# Patient Record
Sex: Female | Born: 1950
Health system: Southern US, Community
[De-identification: ages and names within clinical notes are randomized; demographics above are authoritative.]

## PROBLEM LIST (undated history)

## (undated) DIAGNOSIS — E7800 Pure hypercholesterolemia, unspecified: Secondary | ICD-10-CM

## (undated) DIAGNOSIS — N159 Renal tubulo-interstitial disease, unspecified: Secondary | ICD-10-CM

## (undated) DIAGNOSIS — N200 Calculus of kidney: Secondary | ICD-10-CM

## (undated) DIAGNOSIS — E785 Hyperlipidemia, unspecified: Secondary | ICD-10-CM

## (undated) DIAGNOSIS — K219 Gastro-esophageal reflux disease without esophagitis: Secondary | ICD-10-CM

## (undated) DIAGNOSIS — I10 Essential (primary) hypertension: Secondary | ICD-10-CM

## (undated) DIAGNOSIS — M1990 Unspecified osteoarthritis, unspecified site: Secondary | ICD-10-CM

## (undated) DIAGNOSIS — IMO0001 Reserved for inherently not codable concepts without codable children: Secondary | ICD-10-CM

## (undated) DIAGNOSIS — R011 Cardiac murmur, unspecified: Secondary | ICD-10-CM

## (undated) DIAGNOSIS — J449 Chronic obstructive pulmonary disease, unspecified: Secondary | ICD-10-CM

## (undated) HISTORY — PX: OTHER SURGICAL HISTORY: SHX169

---

## 2007-06-06 ENCOUNTER — Ambulatory Visit: Payer: Self-pay | Admitting: Gynecology

## 2007-06-26 ENCOUNTER — Ambulatory Visit (HOSPITAL_COMMUNITY): Admission: RE | Admit: 2007-06-26 | Discharge: 2007-06-26 | Payer: Self-pay | Admitting: Gynecology

## 2007-07-12 ENCOUNTER — Ambulatory Visit: Payer: Self-pay | Admitting: Gynecology

## 2007-08-13 ENCOUNTER — Inpatient Hospital Stay (HOSPITAL_COMMUNITY): Admission: RE | Admit: 2007-08-13 | Discharge: 2007-08-15 | Payer: Self-pay | Admitting: Gynecology

## 2007-08-13 ENCOUNTER — Encounter (INDEPENDENT_AMBULATORY_CARE_PROVIDER_SITE_OTHER): Payer: Self-pay | Admitting: Gynecology

## 2007-08-13 ENCOUNTER — Ambulatory Visit: Payer: Self-pay | Admitting: Gynecology

## 2007-08-29 ENCOUNTER — Ambulatory Visit: Payer: Self-pay | Admitting: *Deleted

## 2007-10-04 ENCOUNTER — Ambulatory Visit: Payer: Self-pay | Admitting: Gynecology

## 2011-02-08 NOTE — H&P (Signed)
[name], [name] NO.:  [id number]   MEDICAL RECORD NO.:  [id number]           PATIENT TYPE:   LOCATION:                                 FACILITY:   PHYSICIAN:  Ginger Carne, MD  DATE OF BIRTH:  1951/07/17   DATE OF ADMISSION:  DATE OF DISCHARGE:                              HISTORY & PHYSICAL   PREOPERATIVE HISTORY AND PHYSICAL.   CHIEF COMPLAINT:  A 9 cm right adnexal complex cystic mass.   HISTORY OF PRESENT ILLNESS:  This patient is a 60 year old  postmenopausal female who presents with an enlarging right adnexal mass.  Initial sonogram obtained at [facility] on  May 11, 2007, demonstrated a right ovary measuring 7.2 x 7.2 x 7.3  cm.  Repeat sonogram on June 26, 2007 at the [facility] of  [place] indicated said mass to be 9.1 cm with cystic and hyperechoic  solid components with the appearance of a benign cystic teratoma.  No  evidence for ascites was noted.  The left ovary was normal.  The uterus  demonstrated 4 leiomyoma with a total overall dimension of 8 cm x 4.2 x  6.1 cm.  The patient had encountered a 1-week history of postmenopausal  bleeding which brought her in to the [facility] clinics in early  September of 2008.  An endometrial biopsy demonstrated atrophic  endometrium, no evidence of neoplasia or hyperplasia.   OB/GYN HISTORY:  The patient has had five pregnancies, three resulting  in normal spontaneous vaginal deliveries, and two first trimester  miscarriages.   MEDICAL HISTORY:  1. Hypercholesterolemia.  2. Asthma.  3. Hypertension.  4. Type 2 diabetes.   CURRENT MEDICATIONS:  1. Ranitidine 150 mg daily.  2. Metformin 500 mg twice a day.  3. Aspirin 325 mg daily.  4. Diltiazem 240 mg daily.  5. Zocor 20 mg daily.  6. Albuterol inhaler taken as needed.  7. Advair inhaler taken as needed.   SURGICAL HISTORY:  Right breast biopsy in 2001 reported as benign.   SOCIAL HISTORY:   The patient smokes 1/2-pack of cigarettes daily.  Denies alcohol or illicit drug abuse.   FAMILY HISTORY:  Type 2 diabetes in mother and father.  Myocardial  infarction of her mother.  Hypertension of mother and father.  Deep  venous thrombophlebitis of her mother, kidney stones as well (mother).   REVIEW OF SYSTEMS:  A 14-point comprehensive review of systems within  normal limits.   PHYSICAL EXAM:  VITAL SIGNS:  Blood pressure 142/80, weight 200 pounds,  height 6 feet 3 inches.  Pulse 80.  HEENT: Grossly normal.  BREAST EXAM:  Without masses, discharge, thickenings, or tenderness.  CHEST:  Clear to percussion and auscultation.  CARDIOVASCULAR EXAM:  Without murmurs or enlargements.  Regular rate and  rhythm.  EXTREMITIES, LYMPHATICS, SKIN, NEUROLOGICAL, AND MUSCULOSKELETAL:  Systems within normal limits.  ABDOMEN:  Soft without gross hepatosplenomegaly.  PELVIC EXAM:  External genitalia, vulva and vagina normal.  Cervix  smooth without erosions or lesions.  Normal Pap smear.  Uterus is  approximately 8  weeks' in size, globular.  Right adnexa demonstrates  fullness.  Left adnexa normal.  RECTAL EXAM:  Confirmatory of pelvic exam.   IMPRESSION:  A 9 cm complex right adnexal mass, possible benign cystic  teratoma.   PLAN:  The patient would prefer a right salpingo-oophorectomy, and only  proceed with a total abdominal hysterectomy and bilateral salpingo-  oophorectomy if warranted based on either borderline malignancy or  suspicion for malignancy.  She understands the risks of performing  surgery in the setting of a frozen section which may necessitate, after  final pathology cuts, more surgery in the event that she only has a  right salpingo-oophorectomy done.  Also, the patient understands that  there are no guarantees that the left ovary may, over time, develop a  carcinoma or a new mass.  The nature of said procedure was discussed in  detail; risks, including possible  injuries to ureter, bowel, and  bladder, hemorrhage possibly requiring a blood transfusion, infection,  intraoperative and postoperative pulmonary, infectious complications  were discussed and understood by said patient.  Therefore, the plan is  to proceed with a right salpingo-oophorectomy with possible conversion  to a total abdominal hysterectomy, bilateral salpingo-oophorectomy, and  omentectomy if warranted.  Ashby Dawes of said procedure discussed in detail.      Ginger Carne, MD  Electronically Signed     SHB/MEDQ  D:  08/08/2007  T:  08/09/2007  Job:  [id number]

## 2011-02-08 NOTE — Op Note (Signed)
NAMEANAELLE, DUNTON NO.:  [id number]   MEDICAL RECORD NO.:  [id number]          PATIENT TYPE:  INP   LOCATION:  9316                          FACILITY:  [facility]   PHYSICIAN:  Ginger Carne, MD  DATE OF BIRTH:  June 20, 1951   DATE OF PROCEDURE:  08/13/2007  DATE OF DISCHARGE:                               OPERATIVE REPORT   PREOPERATIVE DIAGNOSIS:  Right ovarian mass.   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   PROCEDURE:  Open right salpingo-oophorectomy.   SURGEON:  Ginger Carne, MD   ASSISTANT:  Myra C. Marice Potter, MD.   ESTIMATED BLOOD LOSS:  Less than 100 mL.   COMPLICATIONS:  None immediate.   SPECIMEN:  Right tube and ovary to pathology.   OPERATIVE FINDINGS:  Upon opening the abdomen, the right ovary was  approximately 10 cm in size and adherent to the rectosigmoid colon by  filmy adhesions.  There were no excrescences on the surface and no  suggestion of carcinoma based on appearance.  Left tube and ovary  appeared normal.  The uterus was small without irregularity on its  surface.  Large and small bowel were grossly normal.  No periaortic or  pelvic enlargement of lymph nodes.   OPERATIVE PROCEDURE:  The patient prepped and draped in the usual  fashion and placed in the supine position.  Betadine solution used for  antiseptic and the patient was catheterized prior to the procedure.  After adequate general anesthesia, a vertical infraumbilical incision  was made and the abdomen opened.  Inspection of the abdominal and pelvic  contents followed.  Meticulous dissection of adhesions between the right  ovary and the rectosigmoid colon was performed.  The ureter was  carefully identified throughout the course of the right pelvis.  The  right infundibulopelvic ligament was identified, clamped, cut and  ligated twice with 0 Vicryl suture.  The right utero-ovarian ligament  was then clamped, cut and  ligated with 0 Vicryl suture.  Specimen sent  for frozen section, which revealed a benign cystic teratoma.  Bleeding  points hemostatically checked, blood clots removed.  Closure of  the fascia in one layer with double-looped 0 PDS running suture and skin  staples for the skin.  Instrument and sponge count were correct.  The  patient tolerated the procedure well and returned to the post anesthesia  recovery room in excellent condition.      Ginger Carne, MD  Electronically Signed     SHB/MEDQ  D:  08/13/2007  T:  08/14/2007  Job:  [id number]

## 2011-02-08 NOTE — Discharge Summary (Signed)
[name], [name] NO.:  [id number]   MEDICAL RECORD NO.:  [id number]          PATIENT TYPE:  INP   LOCATION:  9316                          [facility]ILITY[facility]   PHYSICIAN:  Ginger Carne, MD  DATE OF BIRTH:  1951/02/20   DATE OF ADMISSION:  08/13/2007  DATE OF DISCHARGE:  08/15/2007                               DISCHARGE SUMMARY   REASON FOR HOSPITALIZATION:  Right adnexal mass.   IN-HOSPITAL PROCEDURE:  Right salpingo-oophorectomy (by laparotomy).   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   HOSPITAL COURSE:  This patient is a 60 year old [place] American female  who underwent the aforementioned procedure on August 13, 2007.  Intraoperative course unremarkable.  Postoperatively, she did well.  She  was afebrile.  H&H 34.8 and 11.2.  Creatinine 0.9.  Her abdomen was  soft.  Incision was dry.  Calves without tenderness.  Lungs were clear.  The patient had a bowel movement prior to discharge.  She was discharged  with routine postoperative instructions including contacting the office  for temperature elevation above 100.4 degrees Fahrenheit, increasing  abdominal or pelvic pain, incisional bleeding, erythema or drainage.  Also, she is to contact the office for constipation or urinary  symptomatology.   She will continue her medications which she had taken prior to her  surgery including Zocor, metformin, ranitidine, aspirin, diltiazem,  albuterol and Advair at doses indicated in the MR sheet.  She was  prescribed Percocet 5/325 one to two every 4-6 hours as needed for pain  postoperatively.   She will be seen on August 27, 2007, for staple removal and she will  contact the GYN clinic for same.  All questions answered to the  satisfaction of said patient.  The patient verbalized understanding of  same.      Ginger Carne, MD  Electronically Signed     SHB/MEDQ  D:  08/15/2007  T:  08/15/2007   Job:  [id number]

## 2011-02-08 NOTE — Group Therapy Note (Signed)
NAME:  [name], FRYMAN NO.:  [id number]   MEDICAL RECORD NO.:  [id number]          PATIENT TYPE:  WOC   LOCATION:  [facility]                   FACILITY:  [facility]   PHYSICIAN:  Ginger Carne, MD DATE OF BIRTH:  06/26/1951   DATE OF SERVICE:  06/06/2007                                  CLINIC NOTE   This patient is a 60 year old African-American female with a 1-week  history of postmenopausal bleeding.  Her last menstrual period was about  5 years ago.  She also was noted on ultrasound to have a right ovarian  enlargement measuring 7.2 cm with a cyst described at 6.3 cm  consistency, nature of cyst not described and report left ovary was  normal in size.  The uterus was also normal in size.   PHYSICAL EXAMINATION:  GENITOURINARY:  External genitalia, vulva and  vaginal normal.  Cervix smooth without erosions or lesions.  Uterus  appeared normal size.  Endometrial biopsy performed.  Right adnexa is  palpable but difficult to ascertain fullness or mass.  Left adnexa  appeared normal.   IMPRESSION:  Post menopausal bleeding and right adnexal mass.   PLAN:  The patient is scheduled for a follow-up pelvic sonogram end of  September 2008 and also the patient was asked to return in 2 weeks for  follow-up regarding her endometrial biopsy.           ______________________________  Ginger Carne, MD     SHB/MEDQ  D:  06/06/2007  T:  06/07/2007  Job:  [id number]

## 2011-06-01 ENCOUNTER — Encounter: Payer: Self-pay | Admitting: *Deleted

## 2011-06-01 ENCOUNTER — Emergency Department (HOSPITAL_BASED_OUTPATIENT_CLINIC_OR_DEPARTMENT_OTHER)
Admission: EM | Admit: 2011-06-01 | Discharge: 2011-06-01 | Disposition: A | Discharge status: Home / Self Care | Payer: Self-pay | Attending: Emergency Medicine | Admitting: Emergency Medicine

## 2011-06-01 ENCOUNTER — Other Ambulatory Visit: Payer: Self-pay

## 2011-06-01 DIAGNOSIS — E7800 Pure hypercholesterolemia, unspecified: Secondary | ICD-10-CM | POA: Insufficient documentation

## 2011-06-01 DIAGNOSIS — F17200 Nicotine dependence, unspecified, uncomplicated: Secondary | ICD-10-CM | POA: Insufficient documentation

## 2011-06-01 DIAGNOSIS — J4489 Other specified chronic obstructive pulmonary disease: Secondary | ICD-10-CM | POA: Insufficient documentation

## 2011-06-01 DIAGNOSIS — E119 Type 2 diabetes mellitus without complications: Secondary | ICD-10-CM | POA: Insufficient documentation

## 2011-06-01 DIAGNOSIS — J449 Chronic obstructive pulmonary disease, unspecified: Secondary | ICD-10-CM | POA: Insufficient documentation

## 2011-06-01 DIAGNOSIS — I10 Essential (primary) hypertension: Secondary | ICD-10-CM | POA: Diagnosis present

## 2011-06-01 HISTORY — DX: Chronic obstructive pulmonary disease, unspecified: J44.9

## 2011-06-01 HISTORY — DX: Essential (primary) hypertension: I10

## 2011-06-01 HISTORY — DX: Pure hypercholesterolemia, unspecified: E78.00

## 2011-06-01 LAB — BASIC METABOLIC PANEL
BUN: 16 mg/dL (ref 6–23)
Chloride: 103 mEq/L (ref 96–112)
Creatinine, Ser: 0.7 mg/dL (ref 0.50–1.10)
GFR calc Af Amer: >60 mL/min (ref >60)
Glucose, Bld: 102 mg/dL — ABNORMAL HIGH (ref 70–99)

## 2011-06-01 LAB — URINALYSIS, ROUTINE W REFLEX MICROSCOPIC
Bilirubin Urine: NEGATIVE
Nitrite: NEGATIVE
Urobilinogen, UA: 1 mg/dL (ref 0.0–1.0)
pH: 7.5 (ref 5.0–8.0)

## 2011-06-01 LAB — URINE MICROSCOPIC-ADD ON

## 2011-06-01 MED ORDER — HYDROCHLOROTHIAZIDE 25 MG PO TABS: 25.0000 mg | ORAL_TABLET | Freq: Every day | ORAL | Status: DC

## 2011-06-01 NOTE — ED Provider Notes (Signed)
History     CSN: [id number] Arrival date & time: 06/01/2011  4:33 AM  Chief Complaint  Patient presents with  . Hypertension   HPI Pt with history of HTN has been doing well recently. She slipped and fell at work earlier today and pulled a muscle in her back. While getting checked there, her BP was noted to be elevated. She did not want to seek medical attention at the time and finished her shift before going home. She woke up during the night feeling her pulse beating in her ears and was concerned about her BP still being elevated. Her pain has mostly resolved. She denies any CP, SOB, nausea vomiting. She has been urinating more frequently recently which she attributes to high sugars. She has not had any changes in her medications recently.  Past Medical History  Diagnosis Date  . Hypertension   . Diabetes mellitus   . High cholesterol   . Asthma   . COPD (chronic obstructive pulmonary disease)     Past Surgical History  Procedure Date  . Fillopian tube removal   . Right breast cyst removal     No family history on file.  History  Substance Use Topics  . Smoking status: Current Everyday Smoker  . Smokeless tobacco: Not on file  . Alcohol Use: No    OB History    Grav Para Term Preterm Abortions TAB SAB Ect Mult Living                  Review of Systems All other systems reviewed and are negative except as noted in HPI.   Physical Exam  BP 188/82  Pulse 57  Temp(Src) 97.9 F (36.6 C) (Oral)  Resp 18  Ht 5\' 4"  (1.626 m)  Wt 180 lb (81.647 kg)  BMI 30.90 kg/m2  SpO2 97%  Physical Exam  Nursing note and vitals reviewed. Constitutional: She is oriented to person, place, and time. She appears well-developed and well-nourished.  HENT:  Head: Normocephalic and atraumatic.  Eyes: EOM are normal. Pupils are equal, round, and reactive to light.  Neck: Normal range of motion. Neck supple.  Cardiovascular: Normal rate, normal heart sounds and intact distal pulses.     Pulmonary/Chest: Effort normal and breath sounds normal.  Abdominal: Bowel sounds are normal. She exhibits no distension. There is no tenderness.  Musculoskeletal: Normal range of motion. She exhibits no edema and no tenderness.  Neurological: She is alert and oriented to person, place, and time. No cranial nerve deficit.  Skin: Skin is warm and dry. No rash noted.  Psychiatric: She has a normal mood and affect.    ED Course  Procedures  MDM  Date: 06/01/2011  Rate: 56  Rhythm: sinus bradycardia  QRS Axis: normal  Intervals: normal  ST/T Wave abnormalities: normal  Conduction Disutrbances:none  Narrative Interpretation:   Old EKG Reviewed: unchanged from 08/10/07   6:28 AM No evidence of end organ damage. BP still slightly elevated. Will borderline HR will not increase diltiazem. Plan to start HCTZ and advised to followup with PCP in a week.

## 2011-06-01 NOTE — ED Notes (Signed)
Dr Bernette Mayers at bedside to assess pt

## 2011-06-01 NOTE — ED Notes (Signed)
Pt states she slipped on a water spill at work this evening, falling onto her knee and pulling her back. States the staff filled out an incident report and checked her vitals per protocol. States they noticed her BP was elevated at that time. 170/100 per pt. States she went to bed tonight, and woke up feeling like her pulse was throbbing. Pt here tonight to be evaluated for continued elevated BP

## 2011-06-01 NOTE — ED Notes (Signed)
Pt states she does not have a BP monitor at home, and is unsure whether her BP has been running high before tonight. Denies HA, dizziness or lightheadedness, but states she has had some change in vision for the past several weeks. Last visit with PMD was several months ago "and i think everything looked fine". Pt states she has been taking her meds as prescribed and does not miss doses.

## 2011-06-22 ENCOUNTER — Emergency Department (HOSPITAL_BASED_OUTPATIENT_CLINIC_OR_DEPARTMENT_OTHER)
Admission: EM | Admit: 2011-06-22 | Discharge: 2011-06-22 | Disposition: A | Discharge status: Home / Self Care | Payer: Self-pay | Attending: Emergency Medicine | Admitting: Emergency Medicine

## 2011-06-22 ENCOUNTER — Other Ambulatory Visit: Payer: Self-pay

## 2011-06-22 ENCOUNTER — Encounter (HOSPITAL_BASED_OUTPATIENT_CLINIC_OR_DEPARTMENT_OTHER): Payer: Self-pay | Admitting: *Deleted

## 2011-06-22 DIAGNOSIS — E119 Type 2 diabetes mellitus without complications: Secondary | ICD-10-CM | POA: Insufficient documentation

## 2011-06-22 DIAGNOSIS — J4489 Other specified chronic obstructive pulmonary disease: Secondary | ICD-10-CM | POA: Insufficient documentation

## 2011-06-22 DIAGNOSIS — I10 Essential (primary) hypertension: Secondary | ICD-10-CM

## 2011-06-22 DIAGNOSIS — R0789 Other chest pain: Secondary | ICD-10-CM

## 2011-06-22 DIAGNOSIS — J449 Chronic obstructive pulmonary disease, unspecified: Secondary | ICD-10-CM | POA: Insufficient documentation

## 2011-06-22 DIAGNOSIS — E785 Hyperlipidemia, unspecified: Secondary | ICD-10-CM | POA: Insufficient documentation

## 2011-06-22 HISTORY — DX: Calculus of kidney: N20.0

## 2011-06-22 HISTORY — DX: Reserved for inherently not codable concepts without codable children: IMO0001

## 2011-06-22 HISTORY — DX: Hyperlipidemia, unspecified: E78.5

## 2011-06-22 HISTORY — DX: Renal tubulo-interstitial disease, unspecified: N15.9

## 2011-06-22 HISTORY — DX: Gastro-esophageal reflux disease without esophagitis: K21.9

## 2011-06-22 LAB — TROPONIN I: Troponin I: <0.3 ng/mL (ref <0.30)

## 2011-06-22 NOTE — ED Provider Notes (Signed)
History     CSN: [id number] Arrival date & time: 06/22/2011  5:34 PM  Chief Complaint  Patient presents with  . Hypertension    180/100    (Consider location/radiation/quality/duration/timing/severity/associated sxs/prior treatment) HPI Comments:  Patient reports that for the past month she has had rather random episodes of diaphoresis not necessarily associated with any chest pain, shortness of breath or physical activity. 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 She reports that when she palpated that area she experienced a sharp pain that was nonradiating. She has had repeated episodes of the sharp pain even without palpation but certainly with palpation it seems to aggravate that region. She admits that she does do a lot of lifting of patients moving him around and behaving at work. She reports that in the last few weeks with physical activity such as exercise or prolonged walking, she does not experience any chest tightness, shortness of breath, nausea or vomiting. Occasionally she will have some diaphoresis that she feels somewhat excessive. She denies any weight loss, change in cough and no pleuritic chest pain. She denies any upper respiratory infection symptoms. She does have a history of type 2 diabetes as well as high blood pressure. She reports that while she was at work and she experienced these symptoms, she had a nurse took her blood pressure and was found to be approximately 180/100 and therefore between her symptoms and her elevated blood pressure decided to come here to the emergency department for further evaluation. At this moment in time she denies any chest pain, sweats, nausea, shortness of breath.  Patient is a 60 y.o. female presenting with hypertension. The history is provided by the patient.  Hypertension    Past Medical History  Diagnosis Date  . Hypertension   . Diabetes  mellitus   . High cholesterol   . Asthma   . COPD (chronic obstructive pulmonary disease)   . Hyperlipidemia   . Reflux   . Kidney infection   . Kidney stone     Past Surgical History  Procedure Date  . Fillopian tube removal   . Right breast cyst removal     No family history on file.  History  Substance Use Topics  . Smoking status: Current Some Day Smoker  . Smokeless tobacco: Not on file  . Alcohol Use: No    OB History    Grav Para Term Preterm Abortions TAB SAB Ect Mult Living                  Review of Systems  All other systems reviewed and are negative.    Allergies  Azithromycin and Keflex  Home Medications   Current Outpatient Rx  Name Route Sig Dispense Refill  . ALBUTEROL SULFATE HFA 108 (90 BASE) MCG/ACT IN AERS Inhalation Inhale 1 puff into the lungs every 6 (six) hours as needed. Shortness of breath and wheezing      . ASPIRIN EC 81 MG PO TBEC Oral Take 81 mg by mouth daily.      Marland Kitchen DILTIAZEM HCL COATED BEADS 240 MG PO CP24 Oral Take 240 mg by mouth daily.      Marland Kitchen FLUTICASONE-SALMETEROL 500-50 MCG/DOSE IN AEPB Inhalation Inhale 1 puff into the lungs every 12 (twelve) hours.      Marland Kitchen HYDROCHLOROTHIAZIDE 25 MG PO TABS Oral Take 1 tablet (25 mg total) by mouth daily. 30 tablet 0  .  METFORMIN HCL 500 MG PO TABS Oral Take 500 mg by mouth 2 (two) times daily with a meal.      . PRAVASTATIN SODIUM 20 MG PO TABS Oral Take 20 mg by mouth daily.        BP 169/83  Pulse 57  Temp(Src) 98.3 F (36.8 C) (Oral)  Resp 20  Ht 5\' 3"  (1.6 m)  Wt 190 lb (86.183 kg)  BMI 33.66 kg/m2  SpO2 99%  Physical Exam  Constitutional: She is oriented to person, place, and time. She appears well-developed and well-nourished. No distress.  HENT:  Head: Normocephalic and atraumatic.  Eyes: Pupils are equal, round, and reactive to light.  Neck: Neck supple.  Cardiovascular: Normal rate, regular rhythm and normal heart sounds.   Pulmonary/Chest: Effort normal and breath  sounds normal.    Abdominal: Soft. Bowel sounds are normal.  Musculoskeletal: She exhibits no edema and no tenderness.  Neurological: She is alert and oriented to person, place, and time.  Skin: Skin is warm and dry. No rash noted. She is not diaphoretic.  Psychiatric: She has a normal mood and affect.    ED Course  Procedures (including critical care time)   Labs Reviewed  TROPONIN I   No results found.   No diagnosis found.    MDM  EKG done at time 17:46. Rate of 57, sinus bradycardia. There is a RSR prime QRS complex in leads V1 and V2 which are probably normal variant. She has normal axis and normal ST and T-waves.  Patient happens to have elevated blood pressure here. I believe her chest pain is very atypical and certainly reproducible here. She does not look toxic whatsoever. She is counseled about keeping a log regarding her elevated blood pressures and to return to her primary care physician for further treatment of high blood pressure. Her EKG shows no ischemic changes in her clinical picture is very atypical for acute coronary syndrome. She has no other symptoms nor exam findings suggestive of pneumothorax, PE, thoracic injury or thoracic tear.  If troponin I is negative, I feel with very low clinical suspicion for ACS, pt can return home and follow up with PCP.        6:31 PM Pt's troponin is <0.30.  Pt continues to be CP free.  I think NSAID use is appropriate for her CP.  Can f/u with PCP regarding elevated BP  Gavin Pound. Bryon Parker, MD 06/22/11 1610

## 2011-06-22 NOTE — Discharge Instructions (Signed)
 You have been diagnosed by your caregiver as having chest wall pain. SEEK IMMEDIATE MEDICAL ATTENTION IF: You develop a fever.  Your chest pains become severe or intolerable.  You develop new, unexplained symptoms (problems).  You develop shortness of breath, nausea, vomiting, sweating or feel light headed.  You develop a new cough or you cough up blood.  It is reasonable to take ibuprofen  or Aleve  taken with food at home for mild to moderate chest pain symptoms.

## 2011-06-22 NOTE — ED Notes (Signed)
Patient states she was seen here approximately 2 weeks ago and given a prescription for cardizem.  Was seen by her PCP and had potassium added to her medications yesterday.  Has a follow up appointment on Friday.  States today at approximately 330pm, she became diaphoretic and had pain in the center of her chest when she touches it.  Blood pressure was checked by Nurse and found to be 180/100.

## 2011-06-26 ENCOUNTER — Encounter (HOSPITAL_BASED_OUTPATIENT_CLINIC_OR_DEPARTMENT_OTHER): Payer: Self-pay | Admitting: *Deleted

## 2011-06-26 ENCOUNTER — Emergency Department (HOSPITAL_BASED_OUTPATIENT_CLINIC_OR_DEPARTMENT_OTHER)
Admission: EM | Admit: 2011-06-26 | Discharge: 2011-06-26 | Disposition: A | Discharge status: Home / Self Care | Payer: Self-pay | Attending: Emergency Medicine | Admitting: Emergency Medicine

## 2011-06-26 DIAGNOSIS — J4489 Other specified chronic obstructive pulmonary disease: Secondary | ICD-10-CM | POA: Insufficient documentation

## 2011-06-26 DIAGNOSIS — J449 Chronic obstructive pulmonary disease, unspecified: Secondary | ICD-10-CM | POA: Insufficient documentation

## 2011-06-26 DIAGNOSIS — Z8739 Personal history of other diseases of the musculoskeletal system and connective tissue: Secondary | ICD-10-CM | POA: Insufficient documentation

## 2011-06-26 DIAGNOSIS — I10 Essential (primary) hypertension: Secondary | ICD-10-CM | POA: Insufficient documentation

## 2011-06-26 DIAGNOSIS — E7800 Pure hypercholesterolemia, unspecified: Secondary | ICD-10-CM | POA: Insufficient documentation

## 2011-06-26 DIAGNOSIS — E119 Type 2 diabetes mellitus without complications: Secondary | ICD-10-CM | POA: Insufficient documentation

## 2011-06-26 HISTORY — DX: Unspecified osteoarthritis, unspecified site: M19.90

## 2011-06-26 HISTORY — DX: Cardiac murmur, unspecified: R01.1

## 2011-06-26 LAB — CBC
RBC: 4.64 MIL/uL (ref 3.87–5.11)
RDW: 14.4 % (ref 11.5–15.5)
WBC: 10.3 10*3/uL (ref 4.0–10.5)

## 2011-06-26 LAB — DIFFERENTIAL
Basophils Absolute: 0 10*3/uL (ref 0.0–0.1)
Lymphocytes Relative: 26 % (ref 12–46)
Lymphs Abs: 2.6 10*3/uL (ref 0.7–4.0)
Neutro Abs: 6.9 10*3/uL (ref 1.7–7.7)

## 2011-06-26 LAB — BASIC METABOLIC PANEL
Calcium: 10.7 mg/dL — ABNORMAL HIGH (ref 8.4–10.5)
Creatinine, Ser: 0.8 mg/dL (ref 0.50–1.10)
Glucose, Bld: 106 mg/dL — ABNORMAL HIGH (ref 70–99)
Sodium: 141 mEq/L (ref 135–145)

## 2011-06-26 MED ORDER — AMLODIPINE BESYLATE 10 MG PO TABS: 10.0000 mg | ORAL_TABLET | Freq: Every day | ORAL | Status: DC

## 2011-06-26 NOTE — ED Notes (Signed)
Care and follow up reviewed family supportive at side

## 2011-06-26 NOTE — ED Provider Notes (Signed)
Medical screening examination/treatment/procedure(s) were performed by non-physician practitioner and as supervising physician I was immediately available for consultation/collaboration.   Lena Fieldhouse A Tylee Newby, MD 06/26/11 1849 

## 2011-06-26 NOTE — ED Notes (Signed)
States having high blood pressure x 1 month- has been started on new meds- also feeling dizzy and weak- "don't feel myself"

## 2011-06-26 NOTE — ED Provider Notes (Signed)
History     CSN: [id number] Arrival date & time: 06/26/2011  4:05 PM  Chief Complaint  Patient presents with  . Hypertension    (Consider location/radiation/quality/duration/timing/severity/associated sxs/prior treatment) Patient is a 60 y.o. female presenting with hypertension.  Hypertension This is a new problem. The current episode started today. The problem occurs constantly. The problem has been gradually worsening. The symptoms are aggravated by nothing. She has tried nothing for the symptoms. The treatment provided no relief.  Pt reports her blood pressure was high today at work.  Pt is on clonidine and hctz.  Pt reports the medications make her feel bad. [facility]  Past Medical History  Diagnosis Date  . Hypertension   . Diabetes mellitus   . High cholesterol   . Asthma   . COPD (chronic obstructive pulmonary disease)   . Hyperlipidemia   . Reflux   . Kidney infection   . Kidney stone   . Arthritis   . Heart murmur     Past Surgical History  Procedure Date  . Fillopian tube removal   . Right breast cyst removal     No family history on file.  History  Substance Use Topics  . Smoking status: Current Everyday Smoker -- 0.5 packs/day  . Smokeless tobacco: Not on file  . Alcohol Use: No    OB History    Grav Para Term Preterm Abortions TAB SAB Ect Mult Living                  Review of Systems  All other systems reviewed and are negative.    Allergies  Azithromycin and Keflex  Home Medications   Current Outpatient Rx  Name Route Sig Dispense Refill  . ALBUTEROL SULFATE HFA 108 (90 BASE) MCG/ACT IN AERS Inhalation Inhale 1 puff into the lungs every 6 (six) hours as needed. Shortness of breath and wheezing      . ASPIRIN EC 81 MG PO TBEC Oral Take 81 mg by mouth daily.      Marland Kitchen CLONIDINE HCL 0.2 MG PO TABS Oral Take 0.2 mg by mouth 2 (two) times daily.      Marland Kitchen FLUTICASONE-SALMETEROL 500-50 MCG/DOSE IN AEPB Inhalation  Inhale 1 puff into the lungs every 12 (twelve) hours.      Marland Kitchen HYDROCHLOROTHIAZIDE 25 MG PO TABS Oral Take 1 tablet (25 mg total) by mouth daily. 30 tablet 0  . METFORMIN HCL 500 MG PO TABS Oral Take 500 mg by mouth 2 (two) times daily with a meal.      . PRAVASTATIN SODIUM 40 MG PO TABS Oral Take 40 mg by mouth daily.      Marland Kitchen DILTIAZEM HCL COATED BEADS 240 MG PO CP24 Oral Take 240 mg by mouth daily.      Marland Kitchen PRAVASTATIN SODIUM 20 MG PO TABS Oral Take 20 mg by mouth daily.        BP 120/59  Pulse 56  Temp(Src) 98.1 F (36.7 C) (Oral)  Resp 18  Ht 5\' 3"  (1.6 m)  Wt 185 lb (83.915 kg)  BMI 32.77 kg/m2  SpO2 98%  Physical Exam  Nursing note and vitals reviewed. Constitutional: She is oriented to person, place, and time. She appears well-developed and well-nourished.  HENT:  Head: Normocephalic.  Eyes: Conjunctivae and EOM are normal. Pupils are equal, round, and reactive to light.  Neck: Normal range of motion. Neck supple.  Cardiovascular: Normal rate and regular rhythm.   Pulmonary/Chest:  Effort normal.  Abdominal: Soft.  Musculoskeletal: Normal range of motion.  Neurological: She is alert and oriented to person, place, and time.  Skin: Skin is warm.  Psychiatric: She has a normal mood and affect.    ED Course  Procedures (including critical care time)  Labs Reviewed  BASIC METABOLIC PANEL - Abnormal; Notable for the following:    Glucose, Bld 106 (*)    Calcium 10.7 (*)    All other components within normal limits  CBC  DIFFERENTIAL   No results found.   No diagnosis found.    MDM  I advised stop clonidine.  Continue hctz.  I will add norvasc        Langston Masker, [place] 06/26/11 1814

## 2011-07-05 LAB — BASIC METABOLIC PANEL
CO2: 29
Calcium: 10.3
GFR calc Af Amer: >60
GFR calc non Af Amer: >60
Sodium: 140

## 2011-07-05 LAB — CBC
HCT: 34.8 — ABNORMAL LOW
HCT: 37.5
MCHC: 32.2
MCHC: 33.3
MCV: 86.1
MCV: 84
RDW: 14.7
WBC: 17 — ABNORMAL HIGH

## 2011-07-05 LAB — COMPREHENSIVE METABOLIC PANEL
Alkaline Phosphatase: 88
BUN: 8
CO2: 32
Calcium: 9.9
Chloride: 105
Creatinine, Ser: 0.92
GFR calc non Af Amer: >60
Total Bilirubin: 0.7

## 2011-07-05 LAB — HCG, SERUM, QUALITATIVE: Preg, Serum: NEGATIVE

## 2012-02-03 ENCOUNTER — Emergency Department (INDEPENDENT_AMBULATORY_CARE_PROVIDER_SITE_OTHER): Payer: Self-pay

## 2012-02-03 ENCOUNTER — Emergency Department (HOSPITAL_BASED_OUTPATIENT_CLINIC_OR_DEPARTMENT_OTHER)
Admission: EM | Admit: 2012-02-03 | Discharge: 2012-02-03 | Disposition: A | Discharge status: Home / Self Care | Payer: Self-pay | Attending: Emergency Medicine | Admitting: Emergency Medicine

## 2012-02-03 ENCOUNTER — Encounter (HOSPITAL_BASED_OUTPATIENT_CLINIC_OR_DEPARTMENT_OTHER): Payer: Self-pay | Admitting: *Deleted

## 2012-02-03 ENCOUNTER — Telehealth (HOSPITAL_BASED_OUTPATIENT_CLINIC_OR_DEPARTMENT_OTHER): Payer: Self-pay | Admitting: *Deleted

## 2012-02-03 DIAGNOSIS — R059 Cough, unspecified: Secondary | ICD-10-CM | POA: Insufficient documentation

## 2012-02-03 DIAGNOSIS — E785 Hyperlipidemia, unspecified: Secondary | ICD-10-CM | POA: Insufficient documentation

## 2012-02-03 DIAGNOSIS — R05 Cough: Secondary | ICD-10-CM | POA: Insufficient documentation

## 2012-02-03 DIAGNOSIS — E119 Type 2 diabetes mellitus without complications: Secondary | ICD-10-CM | POA: Insufficient documentation

## 2012-02-03 DIAGNOSIS — J4489 Other specified chronic obstructive pulmonary disease: Secondary | ICD-10-CM | POA: Insufficient documentation

## 2012-02-03 DIAGNOSIS — J449 Chronic obstructive pulmonary disease, unspecified: Secondary | ICD-10-CM

## 2012-02-03 DIAGNOSIS — J45909 Unspecified asthma, uncomplicated: Secondary | ICD-10-CM

## 2012-02-03 DIAGNOSIS — F17200 Nicotine dependence, unspecified, uncomplicated: Secondary | ICD-10-CM | POA: Insufficient documentation

## 2012-02-03 DIAGNOSIS — R0609 Other forms of dyspnea: Secondary | ICD-10-CM

## 2012-02-03 DIAGNOSIS — I10 Essential (primary) hypertension: Secondary | ICD-10-CM | POA: Insufficient documentation

## 2012-02-03 MED ORDER — IPRATROPIUM BROMIDE 0.02 % IN SOLN
0.5000 mg | Freq: Once | RESPIRATORY_TRACT | Status: AC
  Administered 2012-02-03: 0.5 mg via RESPIRATORY_TRACT

## 2012-02-03 MED ORDER — ALBUTEROL SULFATE (5 MG/ML) 0.5% IN NEBU
5.0000 mg | INHALATION_SOLUTION | Freq: Once | RESPIRATORY_TRACT | Status: AC
  Administered 2012-02-03: 5 mg via RESPIRATORY_TRACT

## 2012-02-03 MED ORDER — IPRATROPIUM BROMIDE 0.02 % IN SOLN
RESPIRATORY_TRACT | Status: AC
  Administered 2012-02-03: 0.5 mg via RESPIRATORY_TRACT
  Filled 2012-02-03: qty 2.5

## 2012-02-03 MED ORDER — PREDNISONE 50 MG PO TABS
50.0000 mg | ORAL_TABLET | Freq: Every day | ORAL | Status: DC
  Administered 2012-02-03: 50 mg via ORAL

## 2012-02-03 MED ORDER — LEVOFLOXACIN 750 MG PO TABS: 750.0000 mg | ORAL_TABLET | Freq: Every day | ORAL | Status: AC

## 2012-02-03 MED ORDER — PREDNISONE 50 MG PO TABS
ORAL_TABLET | ORAL | Status: AC
  Filled 2012-02-03: qty 1

## 2012-02-03 MED ORDER — ALBUTEROL SULFATE (5 MG/ML) 0.5% IN NEBU
5.0000 mg | INHALATION_SOLUTION | Freq: Once | RESPIRATORY_TRACT | Status: AC
  Administered 2012-02-03: 5 mg via RESPIRATORY_TRACT
  Filled 2012-02-03: qty 1

## 2012-02-03 MED ORDER — LEVOFLOXACIN 750 MG PO TABS: 750.0000 mg | ORAL_TABLET | Freq: Every day | ORAL | Status: DC

## 2012-02-03 MED ORDER — ALBUTEROL SULFATE (5 MG/ML) 0.5% IN NEBU
INHALATION_SOLUTION | RESPIRATORY_TRACT | Status: AC
  Administered 2012-02-03: 5 mg via RESPIRATORY_TRACT
  Filled 2012-02-03: qty 1

## 2012-02-03 MED ORDER — IPRATROPIUM-ALBUTEROL 18-103 MCG/ACT IN AERO: 2.0000 | INHALATION_SPRAY | Freq: Four times a day (QID) | RESPIRATORY_TRACT | Status: DC | PRN

## 2012-02-03 MED ORDER — PREDNISONE 50 MG PO TABS: 50.0000 mg | ORAL_TABLET | Freq: Every day | ORAL | Status: DC

## 2012-02-03 MED ORDER — IPRATROPIUM BROMIDE 0.02 % IN SOLN
0.5000 mg | Freq: Once | RESPIRATORY_TRACT | Status: AC
  Administered 2012-02-03: 0.5 mg via RESPIRATORY_TRACT
  Filled 2012-02-03: qty 2.5

## 2012-02-03 NOTE — ED Provider Notes (Signed)
Pt called back because levaquin was too expensive.  Pt with anaphylaxis to cephalosporin abx, so PCN's probably not safe.  Doxycycline is called in although still expensive, pt will decide if she wants to fill it or now.  She is otherwise treated for COPD with breathing treatments and steroids and had been told to follow up with PCP.    [name]. [name], MD 02/03/12 1333

## 2012-02-03 NOTE — ED Notes (Signed)
Pt return from radiology, RT at bs, second neb tx given.

## 2012-02-03 NOTE — ED Notes (Signed)
Pt takes tx at home for her asthma and is a smoker. Pt takes MDI Albuterol and has taken some HHN tx at home but presented to the ED with some SHOB and tightness in her chest. Pt does have some upper airway wheezes but some insp_exp wheezes on ascultation.

## 2012-02-03 NOTE — ED Notes (Signed)
[name] called to state she can not afford the Levaquin prescription.  Chart reviewed by Dr. Oletta Lamas.  Orders received for Doxycycline 100mg  pm bid.  Prescription was called to [facility] [address] [place], [place] @ [phone].

## 2012-02-03 NOTE — ED Notes (Signed)
Pt c/o cough and SOB for few days, no relief from inhalers.

## 2012-02-03 NOTE — Discharge Instructions (Signed)

## 2012-02-03 NOTE — ED Notes (Signed)
Patient transported to X-ray 

## 2012-02-03 NOTE — ED Provider Notes (Addendum)
History     CSN: [id number]  Arrival date & time 02/03/12  4098   First MD Initiated Contact with Patient 02/03/12 [phone]      Chief Complaint  Patient presents with  . Cough  . Asthma    (Consider location/radiation/quality/duration/timing/severity/associated sxs/prior treatment) Patient is a 61 y.o. female presenting with cough and asthma. The history is provided by the patient. No language interpreter was used.  Cough This is a recurrent problem. The current episode started more than 2 days ago. The problem occurs every few minutes. The cough is productive of sputum. There has been no fever. Associated symptoms include ear congestion and wheezing. Pertinent negatives include no chest pain and no sore throat. The treatment provided no relief. She is a smoker. Her past medical history is significant for COPD and asthma.  Asthma This is a recurrent problem. The current episode started more than 2 days ago. The problem occurs constantly. The problem has not changed since onset.Pertinent negatives include no chest pain. The symptoms are aggravated by nothing. The symptoms are relieved by nothing. Treatments tried: albuterol. The treatment provided no relief.  No CP, No DOE. No n/v/d.  No f/c/r.  No leg swelling nor pain.    Past Medical History  Diagnosis Date  . Hypertension   . Diabetes mellitus   . High cholesterol   . Asthma   . COPD (chronic obstructive pulmonary disease)   . Hyperlipidemia   . Reflux   . Kidney infection   . Kidney stone   . Arthritis   . Heart murmur     Past Surgical History  Procedure Date  . Fillopian tube removal   . Right breast cyst removal     History reviewed. No pertinent family history.  History  Substance Use Topics  . Smoking status: Current Everyday Smoker -- 0.5 packs/day  . Smokeless tobacco: Not on file  . Alcohol Use: No    OB History    Grav Para Term Preterm Abortions TAB SAB Ect Mult Living                  Review of  Systems  Constitutional: Negative for fever.  HENT: Negative for sore throat.   Respiratory: Positive for cough and wheezing. Negative for stridor.   Cardiovascular: Negative for chest pain.  All other systems reviewed and are negative.    Allergies  Azithromycin and Cephalexin  Home Medications   Current Outpatient Rx  Name Route Sig Dispense Refill  . ALBUTEROL SULFATE HFA 108 (90 BASE) MCG/ACT IN AERS Inhalation Inhale 1 puff into the lungs every 6 (six) hours as needed. Shortness of breath and wheezing      . AMLODIPINE BESYLATE 10 MG PO TABS Oral Take 1 tablet (10 mg total) by mouth daily. 30 tablet 0  . ASPIRIN EC 81 MG PO TBEC Oral Take 81 mg by mouth daily.      Marland Kitchen DILTIAZEM HCL ER COATED BEADS 240 MG PO CP24 Oral Take 240 mg by mouth daily.      Marland Kitchen FLUTICASONE-SALMETEROL 500-50 MCG/DOSE IN AEPB Inhalation Inhale 1 puff into the lungs every 12 (twelve) hours.      Marland Kitchen HYDROCHLOROTHIAZIDE 25 MG PO TABS Oral Take 1 tablet (25 mg total) by mouth daily. 30 tablet 0  . METFORMIN HCL 500 MG PO TABS Oral Take 500 mg by mouth 2 (two) times daily with a meal.      . PRAVASTATIN SODIUM 20 MG PO TABS Oral Take  20 mg by mouth daily.      Marland Kitchen PRAVASTATIN SODIUM 40 MG PO TABS Oral Take 40 mg by mouth daily.        BP 135/74  Pulse 85  Temp(Src) 98.2 F (36.8 C) (Oral)  Resp 18  Ht 5\' 5"  (1.651 m)  Wt 190 lb (86.183 kg)  BMI 31.62 kg/m2  SpO2 100%  Physical Exam  Constitutional: She is oriented to person, place, and time. She appears well-developed and well-nourished. No distress.  HENT:  Head: Normocephalic and atraumatic.  Mouth/Throat: Oropharynx is clear and moist.  Eyes: Conjunctivae are normal. Pupils are equal, round, and reactive to light.  Neck: Normal range of motion. Neck supple.  Cardiovascular: Normal rate and regular rhythm.   Pulmonary/Chest: No stridor. She has wheezes.  Abdominal: Soft. Bowel sounds are normal.  Musculoskeletal: Normal range of motion.       No  snuff box tenderness of either wrist has arthritic changes of the hand  Neurological: She is alert and oriented to person, place, and time.  Skin: Skin is warm and dry.  Psychiatric: She has a normal mood and affect.    ED Course  Procedures (including critical care time)  Labs Reviewed - No data to display No results found.   No diagnosis found.    MDM  Follow up with your family doctor.  Return for chest pain, shortness of breath worsening wheezing worsening cough fevers or any concerns.         Jasmine Awe, MD 02/03/12 0455  Devetta Hagenow K Devynne Sturdivant-Rasch, MD 02/03/12 [phone]

## 2012-02-24 ENCOUNTER — Institutional Professional Consult (permissible substitution): Payer: Self-pay | Admitting: Pulmonary Disease

## 2012-08-14 ENCOUNTER — Institutional Professional Consult (permissible substitution): Payer: Self-pay | Admitting: Pulmonary Disease

## 2012-08-27 ENCOUNTER — Encounter (HOSPITAL_BASED_OUTPATIENT_CLINIC_OR_DEPARTMENT_OTHER): Payer: Self-pay | Admitting: *Deleted

## 2012-08-27 ENCOUNTER — Emergency Department (HOSPITAL_BASED_OUTPATIENT_CLINIC_OR_DEPARTMENT_OTHER)
Admission: EM | Admit: 2012-08-27 | Discharge: 2012-08-27 | Disposition: A | Discharge status: Home / Self Care | Payer: Medicaid Other | Attending: Emergency Medicine | Admitting: Emergency Medicine

## 2012-08-27 DIAGNOSIS — J4489 Other specified chronic obstructive pulmonary disease: Secondary | ICD-10-CM | POA: Insufficient documentation

## 2012-08-27 DIAGNOSIS — I10 Essential (primary) hypertension: Secondary | ICD-10-CM | POA: Insufficient documentation

## 2012-08-27 DIAGNOSIS — K219 Gastro-esophageal reflux disease without esophagitis: Secondary | ICD-10-CM | POA: Insufficient documentation

## 2012-08-27 DIAGNOSIS — Z7982 Long term (current) use of aspirin: Secondary | ICD-10-CM | POA: Insufficient documentation

## 2012-08-27 DIAGNOSIS — E785 Hyperlipidemia, unspecified: Secondary | ICD-10-CM | POA: Insufficient documentation

## 2012-08-27 DIAGNOSIS — J449 Chronic obstructive pulmonary disease, unspecified: Secondary | ICD-10-CM | POA: Insufficient documentation

## 2012-08-27 DIAGNOSIS — N189 Chronic kidney disease, unspecified: Secondary | ICD-10-CM | POA: Insufficient documentation

## 2012-08-27 DIAGNOSIS — R011 Cardiac murmur, unspecified: Secondary | ICD-10-CM | POA: Insufficient documentation

## 2012-08-27 DIAGNOSIS — E119 Type 2 diabetes mellitus without complications: Secondary | ICD-10-CM | POA: Insufficient documentation

## 2012-08-27 DIAGNOSIS — F17200 Nicotine dependence, unspecified, uncomplicated: Secondary | ICD-10-CM | POA: Insufficient documentation

## 2012-08-27 DIAGNOSIS — M129 Arthropathy, unspecified: Secondary | ICD-10-CM | POA: Insufficient documentation

## 2012-08-27 DIAGNOSIS — J45901 Unspecified asthma with (acute) exacerbation: Secondary | ICD-10-CM | POA: Insufficient documentation

## 2012-08-27 MED ORDER — PREDNISONE 20 MG PO TABS: ORAL_TABLET | ORAL | Status: DC

## 2012-08-27 MED ORDER — ALBUTEROL SULFATE HFA 108 (90 BASE) MCG/ACT IN AERS: 2.0000 | INHALATION_SPRAY | RESPIRATORY_TRACT | Status: DC | PRN

## 2012-08-27 NOTE — ED Provider Notes (Signed)
History     CSN: [id number]  Arrival date & time 08/27/12  0903   First MD Initiated Contact with Patient 08/27/12 0913      Chief Complaint  Patient presents with  . Asthma    (Consider location/radiation/quality/duration/timing/severity/associated sxs/prior treatment) HPI This 61 year old female is a smoker but has decreased her daily cigarette intake greatly she states he does not have a chronic smoker's cough or chronic cough, she does have a few days however of a nonproductive cough with no fever, she does have some mild wheezing and mild shortness of breath and is running out of her albuterol inhaler, she uses her albuterol inhaler 3 times a day the last few days, she is no confusion chest pain abdominal pain vomiting body aches or other concerns. Past Medical History  Diagnosis Date  . Hypertension   . Diabetes mellitus   . High cholesterol   . Asthma   . COPD (chronic obstructive pulmonary disease)   . Hyperlipidemia   . Reflux   . Kidney infection   . Kidney stone   . Arthritis   . Heart murmur     Past Surgical History  Procedure Date  . Fillopian tube removal   . Right breast cyst removal     History reviewed. No pertinent family history.  History  Substance Use Topics  . Smoking status: Current Every Day Smoker -- 0.5 packs/day  . Smokeless tobacco: Not on file  . Alcohol Use: No    OB History    Grav Para Term Preterm Abortions TAB SAB Ect Mult Living                  Review of Systems 10 Systems reviewed and are negative for acute change except as noted in the HPI. Allergies  Azithromycin and Cephalexin  Home Medications   Current Outpatient Rx  Name  Route  Sig  Dispense  Refill  . ALBUTEROL SULFATE HFA 108 (90 BASE) MCG/ACT IN AERS   Inhalation   Inhale 1 puff into the lungs every 6 (six) hours as needed. Shortness of breath and wheezing           . ALBUTEROL SULFATE HFA 108 (90 BASE) MCG/ACT IN AERS   Inhalation   Inhale 2 puffs  into the lungs every 2 (two) hours as needed for wheezing or shortness of breath (cough).   1 Inhaler   0   . IPRATROPIUM-ALBUTEROL 18-103 MCG/ACT IN AERO   Inhalation   Inhale 2 puffs into the lungs every 6 (six) hours as needed for wheezing.   1 Inhaler   0   . AMLODIPINE BESYLATE 10 MG PO TABS   Oral   Take 1 tablet (10 mg total) by mouth daily.   30 tablet   0   . ASPIRIN EC 81 MG PO TBEC   Oral   Take 81 mg by mouth daily.           Marland Kitchen DILTIAZEM HCL ER COATED BEADS 240 MG PO CP24   Oral   Take 240 mg by mouth daily.           Marland Kitchen FLUTICASONE-SALMETEROL 500-50 MCG/DOSE IN AEPB   Inhalation   Inhale 1 puff into the lungs every 12 (twelve) hours.           Marland Kitchen HYDROCHLOROTHIAZIDE 25 MG PO TABS   Oral   Take 1 tablet (25 mg total) by mouth daily.   30 tablet   0   .  METFORMIN HCL 500 MG PO TABS   Oral   Take 500 mg by mouth 2 (two) times daily with a meal.           . PRAVASTATIN SODIUM 20 MG PO TABS   Oral   Take 20 mg by mouth daily.           Marland Kitchen PRAVASTATIN SODIUM 40 MG PO TABS   Oral   Take 40 mg by mouth daily.           Marland Kitchen PREDNISONE 20 MG PO TABS      3 tabs po day one, then 2 po daily x 4 days   11 tablet   0   . PREDNISONE 50 MG PO TABS   Oral   Take 1 tablet (50 mg total) by mouth daily.   5 tablet   0     BP 121/74  Pulse 80  Temp 98.4 F (36.9 C) (Oral)  Resp 21  SpO2 100%  Physical Exam  Nursing note and vitals reviewed. Constitutional:       Awake, alert, nontoxic appearance.  HENT:  Head: Atraumatic.  Eyes: Right eye exhibits no discharge. Left eye exhibits no discharge.  Neck: Neck supple.  Cardiovascular: Normal rate and regular rhythm.   No murmur heard. Pulmonary/Chest: Effort normal. No respiratory distress. She has wheezes. She has no rales. She exhibits no tenderness.       The patient speaks full sentences but does have a mild scattered end expiratory wheezes without retractions or accessory muscle usage, her  pulse oximetry is normal on room air at 100% and the patient does not feel she needs a breathing treatment here in the emergency department.  Abdominal: Soft. There is no tenderness. There is no rebound.  Musculoskeletal: She exhibits no edema and no tenderness.       Baseline ROM, no obvious new focal weakness.  Neurological: She is alert.       Mental status and motor strength appears baseline for patient and situation.  Skin: No rash noted.  Psychiatric: She has a normal mood and affect.    ED Course  Procedures (including critical care time)  Labs Reviewed - No data to display No results found.   1. Asthma attack       MDM  Patient / Family / Caregiver informed of clinical course, understand medical decision-making process, and agree with plan.  I doubt any other EMC precluding discharge at this time including, but not necessarily limited to the following:SBI.           Hurman Horn, MD 08/31/12 [phone]

## 2012-08-27 NOTE — ED Notes (Signed)
Pt has asthma feeling more short of breath last 4 days

## 2012-08-27 NOTE — Discharge Instructions (Signed)
Asthma, Acute Bronchospasm Your exam shows you have asthma, or acute bronchospasm that acts like asthma. Bronchospasm means your air passages become narrowed. These conditions are due to inflammation and airway spasm that cause narrowing of the bronchial tubes in the lungs. This causes you to have wheezing and shortness of breath. CAUSES  Respiratory infections and allergies most often bring on these attacks. Smoking, air pollution, cold air, emotional upsets, and vigorous exercise can also bring them on.  TREATMENT   Treatment is aimed at making the narrowed airways larger. Mild asthma/bronchospasm is usually controlled with inhaled medicines. Albuterol is a common medicine that you breathe in to open spastic or narrowed airways. Some trade names for albuterol are Ventolin or Proventil. Steroid medicine is also used to reduce the inflammation when an attack is moderate or severe. Antibiotics (medications used to kill germs) are only used if a bacterial infection is present.  If you are pregnant and need to use Albuterol (Ventolin or Proventil), you can expect the baby to move more than usual shortly after the medicine is used. HOME CARE INSTRUCTIONS   Rest.  Drink plenty of liquids. This helps the mucus to remain thin and easily coughed up. Do not use caffeine or alcohol.  Do not smoke. Avoid being exposed to second-hand smoke.  You play a critical role in keeping yourself in good health. Avoid exposure to things that cause you to wheeze. Avoid exposure to things that cause you to have breathing problems. Keep your medications up-to-date and available. Carefully follow your doctor's treatment plan.  When pollen or pollution is bad, keep windows closed and use an air conditioner go to places with air conditioning. If you are allergic to furry pets or birds, find new homes for them or keep them outside.  Take your medicine exactly as prescribed.  Asthma requires careful medical attention. See  your caregiver for follow-up as advised. If you are more than [redacted] weeks pregnant and you were prescribed any new medications, let your Obstetrician know about the visit and how you are doing. Arrange a recheck. SEEK IMMEDIATE MEDICAL CARE IF:   You are getting worse.  You have trouble breathing. If severe, call 911.  You develop chest pain or discomfort.  You are throwing up or not drinking fluids.  You are not getting better within 24 hours.  You are coughing up yellow, green, brown, or bloody sputum.  You develop a fever over 102 F (38.9 C).  You have trouble swallowing. MAKE SURE YOU:   Understand these instructions.  Will watch your condition.  Will get help right away if you are not doing well or get worse. Document Released: 12/28/2006 Document Revised: 12/05/2011 Document Reviewed: 08/27/2007 [facility] Patient Information 2013 [place], [place].  You appear to have an upper respiratory infection (URI). An upper respiratory tract infection, or cold, is a viral infection of the air passages leading to the lungs. It is contagious and can be spread to others, especially during the first 3 or 4 days. It cannot be cured by antibiotics or other medicines. RETURN IMMEDIATELY IF you develop shortness of breath, confusion or altered mental status, a new rash, become dizzy, faint, or poorly responsive, or are unable to be cared for at home.

## 2012-08-28 ENCOUNTER — Institutional Professional Consult (permissible substitution): Payer: Self-pay | Admitting: Pulmonary Disease

## 2012-09-01 ENCOUNTER — Encounter (HOSPITAL_BASED_OUTPATIENT_CLINIC_OR_DEPARTMENT_OTHER): Payer: Self-pay | Admitting: *Deleted

## 2012-09-01 ENCOUNTER — Emergency Department (HOSPITAL_BASED_OUTPATIENT_CLINIC_OR_DEPARTMENT_OTHER): Payer: Medicaid Other

## 2012-09-01 ENCOUNTER — Observation Stay (HOSPITAL_BASED_OUTPATIENT_CLINIC_OR_DEPARTMENT_OTHER)
Admission: EM | Admit: 2012-09-01 | Discharge: 2012-09-03 | Disposition: A | Discharge status: Home / Self Care | Payer: Medicaid Other | Attending: Internal Medicine | Admitting: Internal Medicine

## 2012-09-01 DIAGNOSIS — E785 Hyperlipidemia, unspecified: Secondary | ICD-10-CM

## 2012-09-01 DIAGNOSIS — E119 Type 2 diabetes mellitus without complications: Secondary | ICD-10-CM

## 2012-09-01 DIAGNOSIS — I10 Essential (primary) hypertension: Secondary | ICD-10-CM | POA: Diagnosis present

## 2012-09-01 DIAGNOSIS — Z79899 Other long term (current) drug therapy: Secondary | ICD-10-CM | POA: Insufficient documentation

## 2012-09-01 DIAGNOSIS — F17200 Nicotine dependence, unspecified, uncomplicated: Secondary | ICD-10-CM | POA: Insufficient documentation

## 2012-09-01 DIAGNOSIS — Z7982 Long term (current) use of aspirin: Secondary | ICD-10-CM | POA: Insufficient documentation

## 2012-09-01 DIAGNOSIS — Z7902 Long term (current) use of antithrombotics/antiplatelets: Secondary | ICD-10-CM | POA: Insufficient documentation

## 2012-09-01 DIAGNOSIS — M1990 Unspecified osteoarthritis, unspecified site: Secondary | ICD-10-CM

## 2012-09-01 DIAGNOSIS — M069 Rheumatoid arthritis, unspecified: Secondary | ICD-10-CM

## 2012-09-01 DIAGNOSIS — J449 Chronic obstructive pulmonary disease, unspecified: Secondary | ICD-10-CM

## 2012-09-01 DIAGNOSIS — E7800 Pure hypercholesterolemia, unspecified: Secondary | ICD-10-CM | POA: Insufficient documentation

## 2012-09-01 DIAGNOSIS — J441 Chronic obstructive pulmonary disease with (acute) exacerbation: Principal | ICD-10-CM

## 2012-09-01 LAB — COMPREHENSIVE METABOLIC PANEL
AST: 12 U/L (ref 0–37)
BUN: 19 mg/dL (ref 6–23)
CO2: 30 mEq/L (ref 19–32)
Calcium: 9.8 mg/dL (ref 8.4–10.5)
Chloride: 103 mEq/L (ref 96–112)
Creatinine, Ser: 0.9 mg/dL (ref 0.50–1.10)
GFR calc Af Amer: 80 mL/min — ABNORMAL LOW (ref >90)
GFR calc non Af Amer: 69 mL/min — ABNORMAL LOW (ref >90)
Glucose, Bld: 206 mg/dL — ABNORMAL HIGH (ref 70–99)
Total Bilirubin: 0.3 mg/dL (ref 0.3–1.2)

## 2012-09-01 LAB — CBC WITH DIFFERENTIAL/PLATELET
Eosinophils Relative: 2 % (ref 0–5)
HCT: 35 % — ABNORMAL LOW (ref 36.0–46.0)
Hemoglobin: 11.7 g/dL — ABNORMAL LOW (ref 12.0–15.0)
Lymphocytes Relative: 33 % (ref 12–46)
Lymphs Abs: 5 10*3/uL — ABNORMAL HIGH (ref 0.7–4.0)
MCV: 82 fL (ref 78.0–100.0)
Monocytes Absolute: 0.8 10*3/uL (ref 0.1–1.0)
Monocytes Relative: 5 % (ref 3–12)
Neutro Abs: 9.2 10*3/uL — ABNORMAL HIGH (ref 1.7–7.7)
WBC: 15.3 10*3/uL — ABNORMAL HIGH (ref 4.0–10.5)

## 2012-09-01 MED ORDER — SODIUM CHLORIDE 0.9 % IV SOLN
Freq: Once | INTRAVENOUS | Status: AC
  Administered 2012-09-01: 10 mL/h via INTRAVENOUS

## 2012-09-01 MED ORDER — ALBUTEROL (5 MG/ML) CONTINUOUS INHALATION SOLN
10.0000 mg/h | INHALATION_SOLUTION | Freq: Once | RESPIRATORY_TRACT | Status: DC
  Filled 2012-09-01: qty 20

## 2012-09-01 MED ORDER — METHYLPREDNISOLONE SODIUM SUCC 125 MG IJ SOLR
125.0000 mg | Freq: Once | INTRAMUSCULAR | Status: AC
  Administered 2012-09-01: 125 mg via INTRAVENOUS
  Filled 2012-09-01: qty 2

## 2012-09-01 MED ORDER — ALBUTEROL SULFATE (5 MG/ML) 0.5% IN NEBU
INHALATION_SOLUTION | RESPIRATORY_TRACT | Status: AC
  Administered 2012-09-01: 5 mg via RESPIRATORY_TRACT
  Filled 2012-09-01: qty 1

## 2012-09-01 MED ORDER — ALBUTEROL SULFATE (5 MG/ML) 0.5% IN NEBU
INHALATION_SOLUTION | RESPIRATORY_TRACT | Status: AC
  Filled 2012-09-01: qty 0.5

## 2012-09-01 MED ORDER — IPRATROPIUM BROMIDE 0.02 % IN SOLN
RESPIRATORY_TRACT | Status: AC
  Administered 2012-09-01: 0.5 mg via RESPIRATORY_TRACT
  Filled 2012-09-01: qty 2.5

## 2012-09-01 MED ORDER — ALBUTEROL SULFATE (5 MG/ML) 0.5% IN NEBU
5.0000 mg | INHALATION_SOLUTION | Freq: Once | RESPIRATORY_TRACT | Status: AC
  Administered 2012-09-01: 5 mg via RESPIRATORY_TRACT

## 2012-09-01 MED ORDER — IPRATROPIUM BROMIDE 0.02 % IN SOLN
0.5000 mg | Freq: Once | RESPIRATORY_TRACT | Status: AC
  Administered 2012-09-01: 0.5 mg via RESPIRATORY_TRACT

## 2012-09-01 MED ORDER — ALBUTEROL SULFATE (5 MG/ML) 0.5% IN NEBU
10.0000 mg | INHALATION_SOLUTION | RESPIRATORY_TRACT | Status: AC
  Administered 2012-09-01: 10 mg via RESPIRATORY_TRACT

## 2012-09-01 NOTE — ED Notes (Signed)
Pt seen here Mon for same. Finished Prednisone. Today increased SHOB and wheezing. Expiratory wheezing at triage. Mild distress.

## 2012-09-01 NOTE — ED Provider Notes (Signed)
Evidence of wheezing and cough onset 5 days ago. Worsening today. Patient feels somewhat improved since treatment in the emergency department but not at baseline .on exam speaks in sentences lungs with prolonged expiratory phase with expiratory wheezes  Doug Sou, MD 09/01/12 2336

## 2012-09-01 NOTE — ED Provider Notes (Signed)
History     CSN: [id number]  Arrival date & time 09/01/12  4098   First MD Initiated Contact with Patient 09/01/12 1948      Chief Complaint  Patient presents with  . Wheezing    (Consider location/radiation/quality/duration/timing/severity/associated sxs/prior treatment) Patient is a 61 y.o. female presenting with shortness of breath. The history is provided by the patient. No language interpreter was used.  Shortness of Breath  The current episode started today. The onset was gradual. The problem occurs continuously. The problem has been gradually worsening. The problem is severe. Nothing relieves the symptoms. The symptoms are aggravated by activity. Associated symptoms include shortness of breath and wheezing. There was no intake of a foreign body. The Heimlich maneuver was not attempted. She has had prior hospitalizations. She has had no prior intubations. Her past medical history is significant for asthma and past wheezing. She has been less active. There were no sick contacts. Recently, medical care has been given at this facility.   Pt was seen here on 12/2 and treated with prednisone.  Pt took her last dosage yesterday.  Pt reports she became short of breath again today.   Past Medical History  Diagnosis Date  . Hypertension   . Diabetes mellitus   . High cholesterol   . Asthma   . COPD (chronic obstructive pulmonary disease)   . Hyperlipidemia   . Reflux   . Kidney infection   . Kidney stone   . Arthritis   . Heart murmur     Past Surgical History  Procedure Date  . Fillopian tube removal   . Right breast cyst removal     History reviewed. No pertinent family history.  History  Substance Use Topics  . Smoking status: Current Every Day Smoker -- 0.5 packs/day  . Smokeless tobacco: Not on file  . Alcohol Use: No    OB History    Grav Para Term Preterm Abortions TAB SAB Ect Mult Living                  Review of Systems  Respiratory: Positive for  shortness of breath and wheezing.   All other systems reviewed and are negative.    Allergies  Azithromycin; Benadryl; Cephalexin; Nexium; and Spiriva  Home Medications   Current Outpatient Rx  Name  Route  Sig  Dispense  Refill  . ALBUTEROL SULFATE HFA 108 (90 BASE) MCG/ACT IN AERS   Inhalation   Inhale 1 puff into the lungs every 6 (six) hours as needed. Shortness of breath and wheezing           . ALBUTEROL SULFATE HFA 108 (90 BASE) MCG/ACT IN AERS   Inhalation   Inhale 2 puffs into the lungs every 2 (two) hours as needed for wheezing or shortness of breath (cough).   1 Inhaler   0   . IPRATROPIUM-ALBUTEROL 18-103 MCG/ACT IN AERO   Inhalation   Inhale 2 puffs into the lungs every 6 (six) hours as needed for wheezing.   1 Inhaler   0   . AMLODIPINE BESYLATE 10 MG PO TABS   Oral   Take 1 tablet (10 mg total) by mouth daily.   30 tablet   0   . ASPIRIN EC 81 MG PO TBEC   Oral   Take 81 mg by mouth daily.           Marland Kitchen DILTIAZEM HCL ER COATED BEADS 240 MG PO CP24   Oral   Take  240 mg by mouth daily.           Marland Kitchen FLUTICASONE-SALMETEROL 500-50 MCG/DOSE IN AEPB   Inhalation   Inhale 1 puff into the lungs every 12 (twelve) hours.           Marland Kitchen HYDROCHLOROTHIAZIDE 25 MG PO TABS   Oral   Take 1 tablet (25 mg total) by mouth daily.   30 tablet   0   . METFORMIN HCL 500 MG PO TABS   Oral   Take 500 mg by mouth 2 (two) times daily with a meal.           . PRAVASTATIN SODIUM 20 MG PO TABS   Oral   Take 20 mg by mouth daily.           Marland Kitchen PRAVASTATIN SODIUM 40 MG PO TABS   Oral   Take 40 mg by mouth daily.           Marland Kitchen PREDNISONE 20 MG PO TABS      3 tabs po day one, then 2 po daily x 4 days   11 tablet   0   . PREDNISONE 50 MG PO TABS   Oral   Take 1 tablet (50 mg total) by mouth daily.   5 tablet   0     BP 126/64  Pulse 84  Temp 98.2 F (36.8 C) (Oral)  Resp 20  Ht 5\' 4"  (1.626 m)  Wt 185 lb (83.915 kg)  BMI 31.76 kg/m2  SpO2  97%  Physical Exam  Nursing note and vitals reviewed. Constitutional: She appears well-developed and well-nourished.  HENT:  Head: Normocephalic.  Eyes: Pupils are equal, round, and reactive to light.  Neck: Normal range of motion. Neck supple.  Cardiovascular: Normal rate and normal heart sounds.   Pulmonary/Chest: She has wheezes.  Musculoskeletal: Normal range of motion.  Neurological: She is alert.  Skin: Skin is warm.  Psychiatric: She has a normal mood and affect.    ED Course  Procedures (including critical care time)  Labs Reviewed - No data to display Dg Chest 2 View  09/01/2012  *RADIOLOGY REPORT*  Clinical Data: Shortness of breath.  CHEST - 2 VIEW  Comparison: Two-view chest 02/03/2012.  Findings: The heart size is normal.  Mild perihilar bronchitic changes are noted.  No focal airspace disease is evident.  The visualized soft tissues and bony thorax are unremarkable.  IMPRESSION:  1.  Changes compatible with acute bronchitis. 2.  No focal airspace disease is present.   Original Report Authenticated By: Marin Roberts, M.D.      No diagnosis found.    MDM   Results for orders placed during the hospital encounter of 09/01/12  CBC WITH DIFFERENTIAL      Component Value Range   WBC 15.3 (*) 4.0 - 10.5 K/uL   RBC 4.27  3.87 - 5.11 MIL/uL   Hemoglobin 11.7 (*) 12.0 - 15.0 g/dL   HCT 53.6 (*) 64.4 - 03.4 %   MCV 82.0  78.0 - 100.0 fL   MCH 27.4  26.0 - 34.0 pg   MCHC 33.4  30.0 - 36.0 g/dL   RDW 74.2  59.5 - 63.8 %   Platelets 243  150 - 400 K/uL   Neutrophils Relative 60  43 - 77 %   Neutro Abs 9.2 (*) 1.7 - 7.7 K/uL   Lymphocytes Relative 33  12 - 46 %   Lymphs Abs 5.0 (*) 0.7 - 4.0 K/uL  Monocytes Relative 5  3 - 12 %   Monocytes Absolute 0.8  0.1 - 1.0 K/uL   Eosinophils Relative 2  0 - 5 %   Eosinophils Absolute 0.3  0.0 - 0.7 K/uL   Basophils Relative 0  0 - 1 %   Basophils Absolute 0.0  0.0 - 0.1 K/uL  COMPREHENSIVE METABOLIC PANEL       Component Value Range   Sodium 143  135 - 145 mEq/L   Potassium 3.2 (*) 3.5 - 5.1 mEq/L   Chloride 103  96 - 112 mEq/L   CO2 30  19 - 32 mEq/L   Glucose, Bld 206 (*) 70 - 99 mg/dL   BUN 19  6 - 23 mg/dL   Creatinine, Ser 8.29  0.50 - 1.10 mg/dL   Calcium 9.8  8.4 - 56.2 mg/dL   Total Protein 6.8  6.0 - 8.3 g/dL   Albumin 3.7  3.5 - 5.2 g/dL   AST 12  0 - 37 U/L   ALT 8  0 - 35 U/L   Alkaline Phosphatase 70  39 - 117 U/L   Total Bilirubin 0.3  0.3 - 1.2 mg/dL   GFR calc non Af Amer 69 (*) >90 mL/min   GFR calc Af Amer 80 (*) >90 mL/min   Dg Chest 2 View  09/01/2012  *RADIOLOGY REPORT*  Clinical Data: Shortness of breath.  CHEST - 2 VIEW  Comparison: Two-view chest 02/03/2012.  Findings: The heart size is normal.  Mild perihilar bronchitic changes are noted.  No focal airspace disease is evident.  The visualized soft tissues and bony thorax are unremarkable.  IMPRESSION:  1.  Changes compatible with acute bronchitis. 2.  No focal airspace disease is present.   Original Report Authenticated By: Marin Roberts, M.D.     Pt given albuterol and Atrovent neb.  No relief.  Pt given 1 hour continuous neb.  Pt feels better but continuous to have loud expiratory wheezing.   Pt given solumedrol Iv.  Pt's chest xray shows acute bronchitis.          [name] [place], [place] 09/02/12 0001

## 2012-09-02 ENCOUNTER — Encounter (HOSPITAL_COMMUNITY): Payer: Self-pay | Admitting: *Deleted

## 2012-09-02 DIAGNOSIS — E119 Type 2 diabetes mellitus without complications: Secondary | ICD-10-CM

## 2012-09-02 DIAGNOSIS — M069 Rheumatoid arthritis, unspecified: Secondary | ICD-10-CM

## 2012-09-02 DIAGNOSIS — J441 Chronic obstructive pulmonary disease with (acute) exacerbation: Secondary | ICD-10-CM

## 2012-09-02 DIAGNOSIS — J449 Chronic obstructive pulmonary disease, unspecified: Secondary | ICD-10-CM

## 2012-09-02 DIAGNOSIS — M1990 Unspecified osteoarthritis, unspecified site: Secondary | ICD-10-CM

## 2012-09-02 DIAGNOSIS — E785 Hyperlipidemia, unspecified: Secondary | ICD-10-CM

## 2012-09-02 LAB — CBC
HCT: 35.8 % — ABNORMAL LOW (ref 36.0–46.0)
Hemoglobin: 11.5 g/dL — ABNORMAL LOW (ref 12.0–15.0)
MCV: 82.3 fL (ref 78.0–100.0)
RDW: 14.4 % (ref 11.5–15.5)
WBC: 13.9 10*3/uL — ABNORMAL HIGH (ref 4.0–10.5)

## 2012-09-02 LAB — BASIC METABOLIC PANEL
BUN: 17 mg/dL (ref 6–23)
Chloride: 99 mEq/L (ref 96–112)
Creatinine, Ser: 0.68 mg/dL (ref 0.50–1.10)
GFR calc Af Amer: >90 mL/min (ref >90)
Glucose, Bld: 241 mg/dL — ABNORMAL HIGH (ref 70–99)
Potassium: 3.6 mEq/L (ref 3.5–5.1)

## 2012-09-02 LAB — GLUCOSE, CAPILLARY
Glucose-Capillary: 194 mg/dL — ABNORMAL HIGH (ref 70–99)
Glucose-Capillary: 216 mg/dL — ABNORMAL HIGH (ref 70–99)
Glucose-Capillary: 163 mg/dL — ABNORMAL HIGH (ref 70–99)

## 2012-09-02 MED ORDER — ASPIRIN EC 81 MG PO TBEC
81.0000 mg | DELAYED_RELEASE_TABLET | Freq: Every day | ORAL | Status: DC
  Administered 2012-09-02 – 2012-09-03 (×2): 81 mg via ORAL
  Filled 2012-09-02 (×2): qty 1

## 2012-09-02 MED ORDER — SENNOSIDES-DOCUSATE SODIUM 8.6-50 MG PO TABS: 1.0000 | ORAL_TABLET | Freq: Every evening | ORAL | Status: DC | PRN

## 2012-09-02 MED ORDER — HYDROCODONE-ACETAMINOPHEN 5-325 MG PO TABS: 1.0000 | ORAL_TABLET | ORAL | Status: DC | PRN

## 2012-09-02 MED ORDER — ALUM & MAG HYDROXIDE-SIMETH 200-200-20 MG/5ML PO SUSP: 30.0000 mL | Freq: Four times a day (QID) | ORAL | Status: DC | PRN

## 2012-09-02 MED ORDER — METFORMIN HCL 500 MG PO TABS
500.0000 mg | ORAL_TABLET | Freq: Two times a day (BID) | ORAL | Status: DC
  Filled 2012-09-02 (×3): qty 1

## 2012-09-02 MED ORDER — IPRATROPIUM-ALBUTEROL 18-103 MCG/ACT IN AERO
2.0000 | INHALATION_SPRAY | RESPIRATORY_TRACT | Status: DC
  Administered 2012-09-02: 2 via RESPIRATORY_TRACT
  Filled 2012-09-02: qty 14.7

## 2012-09-02 MED ORDER — INSULIN ASPART 100 UNIT/ML ~~LOC~~ SOLN: 0.0000 [IU] | Freq: Every day | SUBCUTANEOUS | Status: DC

## 2012-09-02 MED ORDER — NICOTINE 14 MG/24HR TD PT24
14.0000 mg | MEDICATED_PATCH | Freq: Every day | TRANSDERMAL | Status: DC
  Administered 2012-09-02 – 2012-09-03 (×2): 14 mg via TRANSDERMAL
  Filled 2012-09-02 (×2): qty 1

## 2012-09-02 MED ORDER — DILTIAZEM HCL ER COATED BEADS 240 MG PO CP24
240.0000 mg | ORAL_CAPSULE | Freq: Every day | ORAL | Status: DC
  Administered 2012-09-02 – 2012-09-03 (×2): 240 mg via ORAL
  Filled 2012-09-02 (×2): qty 1

## 2012-09-02 MED ORDER — BENZONATATE 100 MG PO CAPS
100.0000 mg | ORAL_CAPSULE | Freq: Three times a day (TID) | ORAL | Status: DC | PRN
  Administered 2012-09-02: 100 mg via ORAL
  Filled 2012-09-02 (×3): qty 1

## 2012-09-02 MED ORDER — AMLODIPINE BESYLATE 5 MG PO TABS
5.0000 mg | ORAL_TABLET | Freq: Every day | ORAL | Status: DC
Start: 2012-09-02 — End: 2012-09-03
  Administered 2012-09-02 – 2012-09-03 (×2): 5 mg via ORAL
  Filled 2012-09-02 (×2): qty 1

## 2012-09-02 MED ORDER — ONDANSETRON HCL 4 MG PO TABS: 4.0000 mg | ORAL_TABLET | Freq: Four times a day (QID) | ORAL | Status: DC | PRN

## 2012-09-02 MED ORDER — ONDANSETRON HCL 4 MG/2ML IJ SOLN: 4.0000 mg | Freq: Four times a day (QID) | INTRAMUSCULAR | Status: DC | PRN

## 2012-09-02 MED ORDER — LEVOFLOXACIN IN D5W 750 MG/150ML IV SOLN
750.0000 mg | INTRAVENOUS | Status: DC
  Administered 2012-09-02 – 2012-09-03 (×2): 750 mg via INTRAVENOUS
  Filled 2012-09-02 (×2): qty 150

## 2012-09-02 MED ORDER — ACETAMINOPHEN 650 MG RE SUPP: 650.0000 mg | Freq: Four times a day (QID) | RECTAL | Status: DC | PRN

## 2012-09-02 MED ORDER — SODIUM CHLORIDE 0.9 % IJ SOLN: 3.0000 mL | INTRAMUSCULAR | Status: DC | PRN

## 2012-09-02 MED ORDER — ALBUTEROL SULFATE (5 MG/ML) 0.5% IN NEBU
2.5000 mg | INHALATION_SOLUTION | Freq: Four times a day (QID) | RESPIRATORY_TRACT | Status: DC
  Administered 2012-09-02 – 2012-09-03 (×4): 2.5 mg via RESPIRATORY_TRACT
  Filled 2012-09-02 (×6): qty 0.5

## 2012-09-02 MED ORDER — SODIUM CHLORIDE 0.9 % IJ SOLN
3.0000 mL | Freq: Two times a day (BID) | INTRAMUSCULAR | Status: DC
  Administered 2012-09-02 (×3): 3 mL via INTRAVENOUS

## 2012-09-02 MED ORDER — METHYLPREDNISOLONE SODIUM SUCC 40 MG IJ SOLR
40.0000 mg | Freq: Two times a day (BID) | INTRAMUSCULAR | Status: DC
  Administered 2012-09-02: 40 mg via INTRAVENOUS
  Filled 2012-09-02 (×3): qty 1

## 2012-09-02 MED ORDER — PREDNISONE 50 MG PO TABS
60.0000 mg | ORAL_TABLET | Freq: Every day | ORAL | Status: DC
  Administered 2012-09-03: 60 mg via ORAL
  Filled 2012-09-02 (×2): qty 1

## 2012-09-02 MED ORDER — IPRATROPIUM BROMIDE 0.02 % IN SOLN
0.5000 mg | Freq: Four times a day (QID) | RESPIRATORY_TRACT | Status: DC
  Administered 2012-09-02 – 2012-09-03 (×4): 0.5 mg via RESPIRATORY_TRACT
  Filled 2012-09-02 (×6): qty 2.5

## 2012-09-02 MED ORDER — ACETAMINOPHEN 325 MG PO TABS: 650.0000 mg | ORAL_TABLET | Freq: Four times a day (QID) | ORAL | Status: DC | PRN

## 2012-09-02 MED ORDER — INSULIN ASPART 100 UNIT/ML ~~LOC~~ SOLN
0.0000 [IU] | Freq: Three times a day (TID) | SUBCUTANEOUS | Status: DC
  Administered 2012-09-02: 3 [IU] via SUBCUTANEOUS
  Administered 2012-09-02 (×2): 5 [IU] via SUBCUTANEOUS

## 2012-09-02 MED ORDER — ENOXAPARIN SODIUM 40 MG/0.4ML ~~LOC~~ SOLN
40.0000 mg | SUBCUTANEOUS | Status: DC
  Administered 2012-09-02: 40 mg via SUBCUTANEOUS
  Filled 2012-09-02 (×2): qty 0.4

## 2012-09-02 MED ORDER — SIMVASTATIN 10 MG PO TABS
10.0000 mg | ORAL_TABLET | Freq: Every day | ORAL | Status: DC
  Administered 2012-09-02: 10 mg via ORAL
  Filled 2012-09-02 (×2): qty 1

## 2012-09-02 MED ORDER — ALBUTEROL SULFATE (5 MG/ML) 0.5% IN NEBU: 2.5000 mg | INHALATION_SOLUTION | RESPIRATORY_TRACT | Status: DC | PRN

## 2012-09-02 NOTE — ED Notes (Signed)
Assigned to bed 5504 @ [facility], Rn notified, [facility] called for transport.

## 2012-09-02 NOTE — Progress Notes (Signed)
PATIENT DETAILS Name: [name] Age: 61 y.o. Sex: female Date of Birth: 1951-03-22 Admit Date: 09/01/2012 Admitting Physician Dorothea Ogle, MD PCP:No primary provider on file.  Subjective: Feels better-wanted to go home  Assessment/Plan: Active Problems:  COPD exacerbation -patient report clinical improvement -change Solumedrol with prednisone -start scheduled nebs -lungs mostly clear-but still with some scattered rhonchi -c/w empiric Levaquin   Hypertension -c/w Cardizem-also on Amlodpine as outpatient (i.e 2 calcium channel blockers) -resume HCTZ -BP permitting over the next few days   Hyperlipidemia -resume Statin   DM (diabetes mellitus) -hold Metformin while inpatient -CBG's controlled on SSI  Tobacco Abuse -counselled -on transdermal Nicotene  Disposition: Remain inpatient  DVT Prophylaxis: Prophylactic Lovenox  Code Status: Full code   Procedures:  None  CONSULTS:  None  PHYSICAL EXAM: Vital signs in last 24 hours: Filed Vitals:   09/01/12 2332 09/02/12 0230 09/02/12 0422 09/02/12 0720  BP:  132/77 128/69 137/71  Pulse:  65 64 64  Temp:  97.7 F (36.5 C) 97.6 F (36.4 C) 98.4 F (36.9 C)  TempSrc:  Oral Oral Oral  Resp:  16 16 16   Height:  5\' 6"  (1.676 m)    Weight:  90.7 kg (199 lb 15.3 oz)    SpO2: 94% 96% 96% 97%    Weight change:  Body mass index is 32.27 kg/(m^2).   Gen Exam: Awake and alert with clear speech.   Neck: Supple, No JVD.   Chest: B/L Clear-except scattered rhonchi CVS: S1 S2 Regular, no murmurs.  Abdomen: soft, BS +, non tender, non distended.  Extremities: no edema, lower extremities warm to touch. Neurologic: Non Focal.   Skin: No Rash.   Wounds: N/A.    Intake/Output from previous day: No intake or output data in the 24 hours ending 09/02/12 1017   LAB RESULTS: CBC  Lab 09/02/12 0600 09/01/12 2138  WBC 13.9* 15.3*  HGB 11.5* 11.7*  HCT 35.8* 35.0*  PLT 246 243  MCV 82.3 82.0  MCH 26.4 27.4   MCHC 32.1 33.4  RDW 14.4 14.7  LYMPHSABS -- 5.0*  MONOABS -- 0.8  EOSABS -- 0.3  BASOSABS -- 0.0  BANDABS -- --    Chemistries   Lab 09/02/12 0600 09/01/12 2138  NA 139 143  K 3.6 3.2*  CL 99 103  CO2 26 30  GLUCOSE 241* 206*  BUN 17 19  CREATININE 0.68 0.90  CALCIUM 9.9 9.8  MG -- --    CBG:  Lab 09/02/12 0736 09/02/12 0310  GLUCAP 216* 194*    GFR Estimated Creatinine Clearance: 85.9 ml/min (by C-G formula based on Cr of 0.68).  Coagulation profile No results found for this basename: INR:5,PROTIME:5 in the last 168 hours  Cardiac Enzymes No results found for this basename: CK:3,CKMB:3,TROPONINI:3,MYOGLOBIN:3 in the last 168 hours  No components found with this basename: POCBNP:3 No results found for this basename: DDIMER:2 in the last 72 hours No results found for this basename: HGBA1C:2 in the last 72 hours No results found for this basename: CHOL:2,HDL:2,LDLCALC:2,TRIG:2,CHOLHDL:2,LDLDIRECT:2 in the last 72 hours No results found for this basename: TSH,T4TOTAL,FREET3,T3FREE,THYROIDAB in the last 72 hours No results found for this basename: VITAMINB12:2,FOLATE:2,FERRITIN:2,TIBC:2,IRON:2,RETICCTPCT:2 in the last 72 hours No results found for this basename: LIPASE:2,AMYLASE:2 in the last 72 hours  Urine Studies No results found for this basename: UACOL:2,UAPR:2,USPG:2,UPH:2,UTP:2,UGL:2,UKET:2,UBIL:2,UHGB:2,UNIT:2,UROB:2,ULEU:2,UEPI:2,UWBC:2,URBC:2,UBAC:2,CAST:2,CRYS:2,UCOM:2,BILUA:2 in the last 72 hours  MICROBIOLOGY: No results found for this or any previous visit (from the past 240 hour(s)).  RADIOLOGY STUDIES/RESULTS: Dg Chest  2 View  09/01/2012  *RADIOLOGY REPORT*  Clinical Data: Shortness of breath.  CHEST - 2 VIEW  Comparison: Two-view chest 02/03/2012.  Findings: The heart size is normal.  Mild perihilar bronchitic changes are noted.  No focal airspace disease is evident.  The visualized soft tissues and bony thorax are unremarkable.  IMPRESSION:  1.   Changes compatible with acute bronchitis. 2.  No focal airspace disease is present.   Original Report Authenticated By: Marin Roberts, M.D.     MEDICATIONS: Scheduled Meds:   . [COMPLETED] sodium chloride   Intravenous Once  . ipratropium  0.5 mg Nebulization Q6H   And  . albuterol  2.5 mg Nebulization Q6H  . [COMPLETED] albuterol  5 mg Nebulization Once  . aspirin EC  81 mg Oral Daily  . diltiazem  240 mg Oral Daily  . enoxaparin (LOVENOX) injection  40 mg Subcutaneous Q24H  . insulin aspart  0-15 Units Subcutaneous TID WC  . insulin aspart  0-5 Units Subcutaneous QHS  . [COMPLETED] ipratropium  0.5 mg Nebulization Once  . levofloxacin (LEVAQUIN) IV  750 mg Intravenous Q24H  . [COMPLETED] methylPREDNISolone (SOLU-MEDROL) injection  125 mg Intravenous Once  . methylPREDNISolone (SOLU-MEDROL) injection  40 mg Intravenous Q12H  . nicotine  14 mg Transdermal Daily  . sodium chloride  3 mL Intravenous Q12H  . [DISCONTINUED] albuterol  10 mg/hr Nebulization Once  . [DISCONTINUED] albuterol-ipratropium  2 puff Inhalation Q4H  . [DISCONTINUED] metFORMIN  500 mg Oral BID WC   Continuous Infusions:   . albuterol 10 mg (09/01/12 1954)   PRN Meds:.acetaminophen, acetaminophen, albuterol, alum & mag hydroxide-simeth, benzonatate, HYDROcodone-acetaminophen, ondansetron (ZOFRAN) IV, ondansetron, senna-docusate, sodium chloride  Antibiotics: Anti-infectives     Start     Dose/Rate Route Frequency Ordered Stop   09/02/12 0400   levofloxacin (LEVAQUIN) IVPB 750 mg        750 mg 100 mL/hr over 90 Minutes Intravenous Every 24 hours 09/02/12 0243             Jeoffrey Massed, MD  [facility] Pager:[phone]  If 7PM-7AM, please contact night-coverage [email or web address] Password [email or web address] 09/02/2012, 10:17 AM   LOS: 1 day

## 2012-09-02 NOTE — H&P (Signed)
PCP:   Adult health clinic in [facility]   Chief Complaint:  Shortness of breath  HPI: This is a 61 year old female with history of COPD and ongoing tobacco abuse. On Monday she developed shortness of breath, she went to the ER where she described prednisone and discharged home. She did feel better, she followed up with a routine visit at her clinic on Thursday, she had mild wheezing. She did continue to smoke and on Friday her shortness of breath again flared. She was wheezing, she had a significant cough that was productive of whitish sputum. She denied any fevers, chills, nausea, vomiting. She is not on any antibiotics. She returned to med center [facility] ER tonight where she received treatment, [facility] was called with request to admit.  Review of Systems:  The patient denies anorexia, fever, weight loss,, vision loss, decreased hearing, hoarseness, chest pain, syncope, dyspnea on exertion, peripheral edema, balance deficits, hemoptysis, abdominal pain, melena, hematochezia, severe indigestion/heartburn, hematuria, incontinence, genital sores, muscle weakness, suspicious skin lesions, transient blindness, difficulty walking, depression, unusual weight change, abnormal bleeding, enlarged lymph nodes, angioedema, and breast masses.  Past Medical History: Past Medical History  Diagnosis Date  . Hypertension   . Diabetes mellitus   . High cholesterol   . Asthma   . COPD (chronic obstructive pulmonary disease)   . Hyperlipidemia   . Reflux   . Kidney infection   . Kidney stone   . Arthritis   . Heart murmur    Past Surgical History  Procedure Date  . Fillopian tube removal   . Right breast cyst removal     Medications: Prior to Admission medications   Medication Sig Start Date End Date Taking? Authorizing Provider  albuterol (PROVENTIL HFA;VENTOLIN HFA) 108 (90 BASE) MCG/ACT inhaler Inhale 1 puff into the lungs every 6 (six) hours as needed. Shortness of breath and wheezing    Historical Provider, MD  albuterol (PROVENTIL HFA;VENTOLIN HFA) 108 (90 BASE) MCG/ACT inhaler Inhale 2 puffs into the lungs every 2 (two) hours as needed for wheezing or shortness of breath (cough). 08/27/12   Hurman Horn, MD  albuterol-ipratropium (COMBIVENT) [phone] MCG/ACT inhaler Inhale 2 puffs into the lungs every 6 (six) hours as needed for wheezing. 02/03/12 02/02/13  April K Palumbo-Rasch, MD  amLODipine (NORVASC) 10 MG tablet Take 1 tablet (10 mg total) by mouth daily. 06/26/11 06/25/12  Elson Areas, PA  aspirin EC 81 MG tablet Take 81 mg by mouth daily.      Historical Provider, MD  diltiazem (CARDIZEM CD) 240 MG 24 hr capsule Take 240 mg by mouth daily.      Historical Provider, MD  Fluticasone-Salmeterol (ADVAIR) 500-50 MCG/DOSE AEPB Inhale 1 puff into the lungs every 12 (twelve) hours.      Historical Provider, MD  hydrochlorothiazide 25 MG tablet Take 1 tablet (25 mg total) by mouth daily. 06/01/11 05/31/12  Charles B. Bernette Mayers, MD  metFORMIN (GLUCOPHAGE) 500 MG tablet Take 500 mg by mouth 2 (two) times daily with a meal.      Historical Provider, MD  pravastatin (PRAVACHOL) 20 MG tablet Take 20 mg by mouth daily.      Historical Provider, MD  pravastatin (PRAVACHOL) 40 MG tablet Take 40 mg by mouth daily.      Historical Provider, MD  predniSONE (DELTASONE) 20 MG tablet 3 tabs po day one, then 2 po daily x 4 days 08/27/12   Hurman Horn, MD  predniSONE (DELTASONE) 50 MG tablet Take  1 tablet (50 mg total) by mouth daily. 02/03/12   April Smitty Cords, MD    Allergies:   Allergies  Allergen Reactions  . Azithromycin Anaphylaxis  . Benadryl (Diphenhydramine Hcl) Nausea And Vomiting  . Cephalexin Swelling  . Nexium (Esomeprazole Magnesium)   . Spiriva (Tiotropium Bromide Monohydrate)     Social History:  reports that she has been smoking.  She does not have any smokeless tobacco history on file. She reports that she does not drink alcohol or use illicit drugs. lives at home, not  on home oxygen, lives with family  Family History: CVA, hypertension, diabetes mellitus  Physical Exam: Filed Vitals:   09/01/12 2020 09/01/12 2055 09/01/12 2332 09/02/12 0230  BP:    132/77  Pulse:  83  65  Temp:    97.7 F (36.5 C)  TempSrc:    Oral  Resp:  16  16  Height:    5\' 6"  (1.676 m)  Weight:    90.7 kg (199 lb 15.3 oz)  SpO2: 97% 94% 94% 96%    General:  Alert and oriented times three, well developed and nourished, no acute distress Eyes: PERRLA, pink conjunctiva, no scleral icterus ENT: Moist oral mucosa, neck supple, no thyromegaly Lungs: clear to ascultation, generalized wheezing, no crackles, no use of accessory muscles Cardiovascular: regular rate and rhythm, no regurgitation, no gallops, no murmurs. No carotid bruits, no JVD Abdomen: soft, positive BS, non-tender, non-distended, no organomegaly, not an acute abdomen GU: not examined Neuro: CN II - XII grossly intact, sensation intact Musculoskeletal: strength 5/5 all extremities, no clubbing, cyanosis or edema Skin: no rash, no subcutaneous crepitation, no decubitus Psych: appropriate patient   Labs on Admission:   [facility] 09/01/12 2138  NA 143  K 3.2*  CL 103  CO2 30  GLUCOSE 206*  BUN 19  CREATININE 0.90  CALCIUM 9.8  MG --  PHOS --    Basename 09/01/12 2138  AST 12  ALT 8  ALKPHOS 70  BILITOT 0.3  PROT 6.8  ALBUMIN 3.7   No results found for this basename: LIPASE:2,AMYLASE:2 in the last 72 hours  Basename 09/01/12 2138  WBC 15.3*  NEUTROABS 9.2*  HGB 11.7*  HCT 35.0*  MCV 82.0  PLT 243    Micro Results: No results found for this or any previous visit (from the past 240 hour(s)).   Radiological Exams on Admission: Dg Chest 2 View  09/01/2012  *RADIOLOGY REPORT*  Clinical Data: Shortness of breath.  CHEST - 2 VIEW  Comparison: Two-view chest 02/03/2012.  Findings: The heart size is normal.  Mild perihilar bronchitic changes are noted.  No focal airspace disease is evident.   The visualized soft tissues and bony thorax are unremarkable.  IMPRESSION:  1.  Changes compatible with acute bronchitis. 2.  No focal airspace disease is present.   Original Report Authenticated By: Marin Roberts, M.D.     Assessment/Plan Present on Admission:  Acute exacerbation of COPD Tobacco abuse  Admit to MedSurg Solu-Medrol, albuterol nebulizers, oxygen ordered Tessalon Perles when necessary and antibiotics Nicotine patch Diabetes mellitus Stable, ADA diet and sliding scale insulin Monitor for hypoglycemia with steroid administration Hypertension Dyslipidemia  Stable resume home medication  Full code DVT prophylaxis  Nataki Mccrumb 09/02/2012, 3:03 AM

## 2012-09-02 NOTE — ED Notes (Signed)
Report called to [facility] RN room 5504 [facility] bed is ready. Pt currently waiting [facility] transport

## 2012-09-02 NOTE — Procedures (Signed)
Pt CAT stopped per protocol. Pt continues to have Expiratory wheezes bilaterally. Pt oxygen saturations are 92-97%. Pt's aeration has improved after CAT. Will continue to monitor and treat according

## 2012-09-02 NOTE — ED Provider Notes (Signed)
Medical screening examination/treatment/procedure(s) were conducted as a shared visit with non-physician practitioner(s) and myself.  I personally evaluated the patient during the encounter  Doug Sou, MD 09/02/12 [phone]

## 2012-09-02 NOTE — ED Notes (Signed)
Report given to [facility] ETA 20 min for transport to [facility]

## 2012-09-02 NOTE — Progress Notes (Signed)
Pt arrived to unit with [facility] from [facility]. Pt is A&O with stable VS. Pt has expiratory wheezing & no skin issues. Pt is comfortable in bed with call bell in reach.

## 2012-09-02 NOTE — Progress Notes (Signed)
Pt being transferred form Med center in [place] for further management of COPD exacerbation. Telemetry bed requested. Debbora Presto, MD  [facility] Pager [phone]  If 7PM-7AM, please contact night-coverage [email or web address] Password [email or web address]

## 2012-09-03 DIAGNOSIS — E119 Type 2 diabetes mellitus without complications: Secondary | ICD-10-CM

## 2012-09-03 DIAGNOSIS — I10 Essential (primary) hypertension: Secondary | ICD-10-CM

## 2012-09-03 DIAGNOSIS — E785 Hyperlipidemia, unspecified: Secondary | ICD-10-CM

## 2012-09-03 MED ORDER — LEVOFLOXACIN 500 MG PO TABS: 500.0000 mg | ORAL_TABLET | Freq: Every day | ORAL | Status: DC

## 2012-09-03 MED ORDER — BENZONATATE 100 MG PO CAPS
100.0000 mg | ORAL_CAPSULE | Freq: Once | ORAL | Status: AC
  Administered 2012-09-03: 100 mg via ORAL
  Filled 2012-09-03: qty 1

## 2012-09-03 MED ORDER — NICOTINE 14 MG/24HR TD PT24: 1.0000 | MEDICATED_PATCH | Freq: Every day | TRANSDERMAL | Status: DC

## 2012-09-03 MED ORDER — LEVOFLOXACIN 750 MG PO TABS: 750.0000 mg | ORAL_TABLET | Freq: Every day | ORAL | Status: DC

## 2012-09-03 MED ORDER — PREDNISONE 10 MG PO TABS: ORAL_TABLET | ORAL | Status: DC

## 2012-09-03 MED ORDER — ALBUTEROL SULFATE (2.5 MG/3ML) 0.083% IN NEBU: 2.5000 mg | INHALATION_SOLUTION | Freq: Four times a day (QID) | RESPIRATORY_TRACT | Status: DC

## 2012-09-03 MED ORDER — IPRATROPIUM BROMIDE 0.02 % IN SOLN: 0.5000 mg | Freq: Four times a day (QID) | RESPIRATORY_TRACT | Status: DC

## 2012-09-03 MED ORDER — BENZONATATE 100 MG PO CAPS: 100.0000 mg | ORAL_CAPSULE | Freq: Three times a day (TID) | ORAL | Status: DC | PRN

## 2012-09-03 MED ORDER — LEVOFLOXACIN 500 MG PO TABS: 750.0000 mg | ORAL_TABLET | Freq: Every day | ORAL | Status: DC

## 2012-09-03 NOTE — Discharge Summary (Addendum)
PATIENT DETAILS Name: [name] Age: 61 y.o. Sex: female Date of Birth: 12-20-1950 MRN: [id number]. Admit Date: 09/01/2012 Admitting Physician: Dorothea Ogle, MD WUJ:WJXBJYN,WGNFAOZ, MD  Recommendations for Outpatient Follow-up:  1. Encourage to completely stop Tobacco Use 2. Optimize COPD meds 3. May need outpatient Sleep Study 4.  PRIMARY DISCHARGE DIAGNOSIS:  Active Problems:  Hypertension  COPD exacerbation  Hyperlipidemia  DM (diabetes mellitus)  COPD (chronic obstructive pulmonary disease)  Arthritis      PAST MEDICAL HISTORY: Past Medical History  Diagnosis Date  . Hypertension   . Diabetes mellitus   . High cholesterol   . Asthma   . COPD (chronic obstructive pulmonary disease)   . Hyperlipidemia   . Reflux   . Kidney infection   . Kidney stone   . Arthritis   . Heart murmur     DISCHARGE MEDICATIONS:   Medication List     As of 09/03/2012 10:49 AM    STOP taking these medications         albuterol 108 (90 BASE) MCG/ACT inhaler   Commonly known as: PROVENTIL HFA;VENTOLIN HFA      amLODipine 10 MG tablet   Commonly known as: NORVASC      TAKE these medications         albuterol (2.5 MG/3ML) 0.083% nebulizer solution   Commonly known as: PROVENTIL   Take 3 mLs (2.5 mg total) by nebulization 4 (four) times daily. For wheezing      albuterol-ipratropium 18-103 MCG/ACT inhaler   Commonly known as: COMBIVENT   Inhale 2 puffs into the lungs every 6 (six) hours as needed for wheezing.      aspirin EC 81 MG tablet   Take 81 mg by mouth daily.      benzonatate 100 MG capsule   Commonly known as: TESSALON   Take 1 capsule (100 mg total) by mouth 3 (three) times daily as needed for cough.      diltiazem 240 MG 24 hr capsule   Commonly known as: CARDIZEM CD   Take 240 mg by mouth daily.      docusate sodium 100 MG capsule   Commonly known as: COLACE   Take 100 mg by mouth every morning.      Fluticasone-Salmeterol 500-50 MCG/DOSE Aepb    Commonly known as: ADVAIR   Inhale 1 puff into the lungs every 12 (twelve) hours.      hydrochlorothiazide 25 MG tablet   Commonly known as: HYDRODIURIL   Take 25 mg by mouth daily.      ipratropium 0.02 % nebulizer solution   Commonly known as: ATROVENT   Take 2.5 mLs (0.5 mg total) by nebulization every 6 (six) hours.      levofloxacin 750 MG tablet   Commonly known as: LEVAQUIN   Take 1 tablet (750 mg total) by mouth daily.      metFORMIN 500 MG tablet   Commonly known as: GLUCOPHAGE   Take 500 mg by mouth 2 (two) times daily with a meal.      nicotine 14 mg/24hr patch   Commonly known as: NICODERM CQ - dosed in mg/24 hours   Place 1 patch onto the skin daily.      pravastatin 40 MG tablet   Commonly known as: PRAVACHOL   Take 40 mg by mouth at bedtime.      predniSONE 10 MG tablet   Commonly known as: DELTASONE   Take 4 tablets daily for 3 days, then  Take 3 tablets daily for 3 days, then  Take 2 tablets daily for 3 days, then  Take 1 tablet daily for 2 days and then stop           BRIEF HPI:  See H&P, Labs, Consult and Test reports for all details in brief, This is a 61 year old female with history of COPD and ongoing tobacco abuse. On Monday she developed shortness of breath, she went to the ER where she described prednisone and discharged home. She did feel better, she followed up with a routine visit at her clinic on Thursday, she had mild wheezing. She did continue to smoke and on Friday her shortness of breath again flared. She was wheezing, she had a significant cough that was productive of whitish sputum. She denied any fevers, chills, nausea, vomiting. She is not on any antibiotics. She returned to med center [facility] ER tonight where she received treatment, [place] was called with request to admit.  CONSULTATIONS:   None  PERTINENT RADIOLOGIC STUDIES: Dg Chest 2 View  09/01/2012  *RADIOLOGY REPORT*  Clinical Data: Shortness of breath.  CHEST - 2  VIEW  Comparison: Two-view chest 02/03/2012.  Findings: The heart size is normal.  Mild perihilar bronchitic changes are noted.  No focal airspace disease is evident.  The visualized soft tissues and bony thorax are unremarkable.  IMPRESSION:  1.  Changes compatible with acute bronchitis. 2.  No focal airspace disease is present.   Original Report Authenticated By: Marin Roberts, M.D.      PERTINENT LAB RESULTS: CBC:  Basename 09/02/12 0600 09/01/12 2138  WBC 13.9* 15.3*  HGB 11.5* 11.7*  HCT 35.8* 35.0*  PLT 246 243   CMET CMP     Component Value Date/Time   NA 139 09/02/2012 0600   K 3.6 09/02/2012 0600   CL 99 09/02/2012 0600   CO2 26 09/02/2012 0600   GLUCOSE 241* 09/02/2012 0600   BUN 17 09/02/2012 0600   CREATININE 0.68 09/02/2012 0600   CALCIUM 9.9 09/02/2012 0600   PROT 6.8 09/01/2012 2138   ALBUMIN 3.7 09/01/2012 2138   AST 12 09/01/2012 2138   ALT 8 09/01/2012 2138   ALKPHOS 70 09/01/2012 2138   BILITOT 0.3 09/01/2012 2138   GFRNONAA >90 09/02/2012 0600   GFRAA >90 09/02/2012 0600    GFR Estimated Creatinine Clearance: 85.9 ml/min (by C-G formula based on Cr of 0.68). No results found for this basename: LIPASE:2,AMYLASE:2 in the last 72 hours No results found for this basename: CKTOTAL:3,CKMB:3,CKMBINDEX:3,TROPONINI:3 in the last 72 hours No components found with this basename: POCBNP:3 No results found for this basename: DDIMER:2 in the last 72 hours No results found for this basename: HGBA1C:2 in the last 72 hours No results found for this basename: CHOL:2,HDL:2,LDLCALC:2,TRIG:2,CHOLHDL:2,LDLDIRECT:2 in the last 72 hours No results found for this basename: TSH,T4TOTAL,FREET3,T3FREE,THYROIDAB in the last 72 hours No results found for this basename: VITAMINB12:2,FOLATE:2,FERRITIN:2,TIBC:2,IRON:2,RETICCTPCT:2 in the last 72 hours Coags: No results found for this basename: PT:2,INR:2 in the last 72 hours Microbiology: No results found for this or any previous visit (from  the past 240 hour(s)).   BRIEF HOSPITAL COURSE:  COPD exacerbation -patient was admitted, placed on nebulized bronchodilators, IV Solumedrol and IV Levaquin. With these measures, she quickly improved. Her solumedrol was converted to prednisone. She was very anxious to go home yesterday, but we agreed to stay one more day for continued optimization of her treatment. This am-she is doing very well, we ambulated in the hallway  with no difficulty at all-her O2 sat was 96 percent on room air. Her chest is mostly clear on exam with good air with the exception of a few rhonchi. She will be discharged on a taper of prednisone over a period of 10 days, she will continue her inhalers and nebs on discharge as well. She has a follow up appointment with Dr Delford Field on 12/12 at the [place] office  Suspected OSA -patient's claims she has been told she needed a sleep study in the past, I have asked case management to see if we can arrange this prior to her discharge, otherwise this can be done in the outpatient setting when patient follows with Dr Delford Field.  Hypertension -will continue with Cardizem and HCTZ. Will stop Amlopdine on d/c  Hyperlipidemia  -resume Statin   DM (diabetes mellitus)  -resume Metformin on d/c  -CBG's controlled on SSI   Tobacco Abuse  -counselled  -on transdermal Nicotene-she requests at discharge   TODAY-DAY OF DISCHARGE:  Subjective:   [name] today has no headache,no chest abdominal pain,no new weakness tingling or numbness, feels much better wants to go home today. She has been ambulating in the hallway with no major problems.  Objective:   Blood pressure 134/76, pulse 65, temperature 98 F (36.7 C), temperature source Oral, resp. rate 15, height 5\' 6"  (1.676 m), weight 90.7 kg (199 lb 15.3 oz), SpO2 98.00%.  Intake/Output Summary (Last 24 hours) at 09/03/12 1049 Last data filed at 09/02/12 1300  Gross per 24 hour  Intake    240 ml  Output      0 ml  Net     240 ml    Exam Awake Alert, Oriented *3, No new F.N deficits, Normal affect [place].AT,PERRAL Supple Neck,No JVD, No cervical lymphadenopathy appriciated.  Symmetrical Chest wall movement, Good air movement bilaterally, CTAB with the exception of a few rhonchi RRR,No Gallops,Rubs or new Murmurs, No Parasternal Heave +ve B.Sounds, Abd Soft, Non tender, No organomegaly appriciated, No rebound -guarding or rigidity. No Cyanosis, Clubbing or edema, No new Rash or bruise  DISCHARGE CONDITION: Stable  DISPOSITION: HOME  DISCHARGE INSTRUCTIONS:    Activity:  As tolerated  Diet recommendation: Diabetic Diet Heart Healthy diet  Follow-up Information    Follow up with Shan Levans, MD. On 09/06/2012. (2:15 pm)    Contact information:   [address]         Total Time spent on discharge equals 45 minutes.  SignedJeoffrey Massed 09/03/2012 10:49 AM

## 2012-09-03 NOTE — Progress Notes (Signed)
Patient is for discharge today,  pta independent.  Patient states she needs transportation at [employer], because her car is at [facility] on [address], she wanted to know if she gets a bus pass would the bus go out to [address], I informed her that I was not sure that I will inform the CSW.  CSW aware of transportation needs.

## 2012-09-03 NOTE — Progress Notes (Signed)
Patient has increased productive cough, thick white sputum, notified hospitalist for extra dose of tessalon, order received.  Will continue to monitor.  Macarthur Critchley, RN

## 2012-09-03 NOTE — Progress Notes (Signed)
PATIENT DETAILS Name: [name] Age: 61 y.o. Sex: female Date of Birth: [birth year] Admit Date: 09/01/2012 Admitting Physician Dorothea Ogle, MD PCP:No primary provider on file.  Subjective: Feels much better this am-I ambulated her in the hall for quite a while-after ambulation-she was not SOB-O2 Sat was 96 on room air. Assessment/Plan: Active Problems:  COPD exacerbation -significant clinical improvement -taper prednisone -c/w nebs on d/c -lungs clear-only minimal rhochi -c/w empiric Levaquin   Hypertension -c/w Cardizem-also on Amlodpine as outpatient (i.e 2 calcium channel blockers)-will not d/c on amlodipine -resume HCTZ -on d/c   Hyperlipidemia -resume Statin   DM (diabetes mellitus) -resume Metformin on d/c -CBG's controlled on SSI  Tobacco Abuse -counselled -on transdermal Nicotene-she requests at discharge  Disposition: D/C home today-called [facility]-patient has a previously scheduled appt with Dr Delford Field at [place]  DVT Prophylaxis: Prophylactic Lovenox  Code Status: Full code   Procedures:  None  CONSULTS:  None  PHYSICAL EXAM: Vital signs in last 24 hours: Filed Vitals:   09/02/12 2227 09/03/12 0537 09/03/12 0751 09/03/12 0920  BP: 120/63 134/76    Pulse: 55 54  65  Temp: 98.4 F (36.9 C) 98 F (36.7 C)    TempSrc: Axillary Oral    Resp: 16 15    Height:      Weight:      SpO2: 99% 97% 98%     Weight change:  Body mass index is 32.27 kg/(m^2).   Gen Exam: Awake and alert with clear speech.   Neck: Supple, No JVD.   Chest: B/L Clear-very few rhonchi-compared to yesterday CVS: S1 S2 Regular, no murmurs.  Abdomen: soft, BS +, non tender, non distended.  Extremities: no edema, lower extremities warm to touch. Neurologic: Non Focal.   Skin: No Rash.   Wounds: N/A.    Intake/Output from previous day:  Intake/Output Summary (Last 24 hours) at 09/03/12 1026 Last data filed at 09/02/12 1300  Gross per 24 hour   Intake    240 ml  Output      0 ml  Net    240 ml     LAB RESULTS: CBC  Lab 09/02/12 0600 09/01/12 2138  WBC 13.9* 15.3*  HGB 11.5* 11.7*  HCT 35.8* 35.0*  PLT 246 243  MCV 82.3 82.0  MCH 26.4 27.4  MCHC 32.1 33.4  RDW 14.4 14.7  LYMPHSABS -- 5.0*  MONOABS -- 0.8  EOSABS -- 0.3  BASOSABS -- 0.0  BANDABS -- --    Chemistries   Lab 09/02/12 0600 09/01/12 2138  NA 139 143  K 3.6 3.2*  CL 99 103  CO2 26 30  GLUCOSE 241* 206*  BUN 17 19  CREATININE 0.68 0.90  CALCIUM 9.9 9.8  MG -- --    CBG:  Lab 09/03/12 0751 09/02/12 2150 09/02/12 1647 09/02/12 1148 09/02/12 0736  GLUCAP 117* 133* 240* 163* 216*    GFR Estimated Creatinine Clearance: 85.9 ml/min (by C-G formula based on Cr of 0.68).  Coagulation profile No results found for this basename: INR:5,PROTIME:5 in the last 168 hours  Cardiac Enzymes No results found for this basename: CK:3,CKMB:3,TROPONINI:3,MYOGLOBIN:3 in the last 168 hours  No components found with this basename: POCBNP:3 No results found for this basename: DDIMER:2 in the last 72 hours No results found for this basename: HGBA1C:2 in the last 72 hours No results found for this basename: CHOL:2,HDL:2,LDLCALC:2,TRIG:2,CHOLHDL:2,LDLDIRECT:2 in the last 72 hours No results found for this basename: TSH,T4TOTAL,FREET3,T3FREE,THYROIDAB in the last  72 hours No results found for this basename: VITAMINB12:2,FOLATE:2,FERRITIN:2,TIBC:2,IRON:2,RETICCTPCT:2 in the last 72 hours No results found for this basename: LIPASE:2,AMYLASE:2 in the last 72 hours  Urine Studies No results found for this basename: UACOL:2,UAPR:2,USPG:2,UPH:2,UTP:2,UGL:2,UKET:2,UBIL:2,UHGB:2,UNIT:2,UROB:2,ULEU:2,UEPI:2,UWBC:2,URBC:2,UBAC:2,CAST:2,CRYS:2,UCOM:2,BILUA:2 in the last 72 hours  MICROBIOLOGY: No results found for this or any previous visit (from the past 240 hour(s)).  RADIOLOGY STUDIES/RESULTS: Dg Chest 2 View  09/01/2012  *RADIOLOGY REPORT*  Clinical Data: Shortness  of breath.  CHEST - 2 VIEW  Comparison: Two-view chest 02/03/2012.  Findings: The heart size is normal.  Mild perihilar bronchitic changes are noted.  No focal airspace disease is evident.  The visualized soft tissues and bony thorax are unremarkable.  IMPRESSION:  1.  Changes compatible with acute bronchitis. 2.  No focal airspace disease is present.   Original Report Authenticated By: Marin Roberts, M.D.     MEDICATIONS: Scheduled Meds:    . ipratropium  0.5 mg Nebulization Q6H   And  . albuterol  2.5 mg Nebulization Q6H  . amLODipine  5 mg Oral Daily  . aspirin EC  81 mg Oral Daily  . [COMPLETED] benzonatate  100 mg Oral Once  . diltiazem  240 mg Oral Daily  . enoxaparin (LOVENOX) injection  40 mg Subcutaneous Q24H  . insulin aspart  0-15 Units Subcutaneous TID WC  . insulin aspart  0-5 Units Subcutaneous QHS  . levofloxacin (LEVAQUIN) IV  750 mg Intravenous Q24H  . nicotine  14 mg Transdermal Daily  . predniSONE  60 mg Oral Q breakfast  . simvastatin  10 mg Oral q1800  . sodium chloride  3 mL Intravenous Q12H   Continuous Infusions:  PRN Meds:.acetaminophen, acetaminophen, albuterol, alum & mag hydroxide-simeth, benzonatate, HYDROcodone-acetaminophen, ondansetron (ZOFRAN) IV, ondansetron, senna-docusate, sodium chloride  Antibiotics: Anti-infectives     Start     Dose/Rate Route Frequency Ordered Stop   09/02/12 0400   levofloxacin (LEVAQUIN) IVPB 750 mg        750 mg 100 mL/hr over 90 Minutes Intravenous Every 24 hours 09/02/12 0243             Jeoffrey Massed, MD  [facility] Pager:[phone]  If 7PM-7AM, please contact night-coverage [email or web address] Password [email or web address] 09/03/2012, 10:26 AM   LOS: 2 days

## 2012-09-03 NOTE — Progress Notes (Signed)
NURSING PROGRESS NOTE  [name] [id number] Discharge Data: 09/03/2012 1:30 PM Attending Provider: Maretta Bees, MD WUJ:WJXBJYN,WGNFAOZ, MD     [name] to be D/C'd Home per MD order.  Discussed with the patient the After Visit Summary and all questions fully answered. All IV's discontinued with no bleeding noted. All belongings returned to patient for patient to take home.   Last Vital Signs:  Blood pressure 134/76, pulse 65, temperature 98 F (36.7 C), temperature source Oral, resp. rate 15, height 5\' 6"  (1.676 m), weight 90.7 kg (199 lb 15.3 oz), SpO2 98.00%.  Discharge Medication List   Medication List     As of 09/03/2012  1:30 PM    STOP taking these medications         albuterol 108 (90 BASE) MCG/ACT inhaler   Commonly known as: PROVENTIL HFA;VENTOLIN HFA      amLODipine 10 MG tablet   Commonly known as: NORVASC      TAKE these medications         albuterol (2.5 MG/3ML) 0.083% nebulizer solution   Commonly known as: PROVENTIL   Take 3 mLs (2.5 mg total) by nebulization 4 (four) times daily. For wheezing      albuterol-ipratropium 18-103 MCG/ACT inhaler   Commonly known as: COMBIVENT   Inhale 2 puffs into the lungs every 6 (six) hours as needed for wheezing.      aspirin EC 81 MG tablet   Take 81 mg by mouth daily.      benzonatate 100 MG capsule   Commonly known as: TESSALON   Take 1 capsule (100 mg total) by mouth 3 (three) times daily as needed for cough.      diltiazem 240 MG 24 hr capsule   Commonly known as: CARDIZEM CD   Take 240 mg by mouth daily.      docusate sodium 100 MG capsule   Commonly known as: COLACE   Take 100 mg by mouth every morning.      Fluticasone-Salmeterol 500-50 MCG/DOSE Aepb   Commonly known as: ADVAIR   Inhale 1 puff into the lungs every 12 (twelve) hours.      hydrochlorothiazide 25 MG tablet   Commonly known as: HYDRODIURIL   Take 25 mg by mouth daily.      ipratropium 0.02 % nebulizer solution   Commonly known as: ATROVENT   Take 2.5 mLs (0.5 mg total) by nebulization every 6 (six) hours.      levofloxacin 750 MG tablet   Commonly known as: LEVAQUIN   Take 1 tablet (750 mg total) by mouth daily.      metFORMIN 500 MG tablet   Commonly known as: GLUCOPHAGE   Take 500 mg by mouth 2 (two) times daily with a meal.      nicotine 14 mg/24hr patch   Commonly known as: NICODERM CQ - dosed in mg/24 hours   Place 1 patch onto the skin daily.      pravastatin 40 MG tablet   Commonly known as: PRAVACHOL   Take 40 mg by mouth at bedtime.      predniSONE 10 MG tablet   Commonly known as: DELTASONE   Take 4 tablets daily for 3 days, then  Take 3 tablets daily for 3 days, then  Take 2 tablets daily for 3 days, then  Take 1 tablet daily for 2 days and then stop        Rosalie Doctor, Charity fundraiser

## 2012-09-03 NOTE — Care Management Note (Signed)
    Page 1 of 1   09/03/2012     4:44:14 PM   CARE MANAGEMENT NOTE 09/03/2012  Patient:  [name], [name]   Account Number:  [id number]  Date Initiated:  09/03/2012  Documentation initiated by:  Letha Cape  Subjective/Objective Assessment:   dx copd ex  admit as observation- pta independent.     Action/Plan:   Anticipated DC Date:  09/03/2012   Anticipated DC Plan:  HOME/SELF CARE      DC Planning Services  CM consult      Choice offered to / List presented to:             Status of service:  Completed, signed off Medicare Important Message given?   (If response is "NO", the following Medicare IM given date fields will be blank) Date Medicare IM given:   Date Additional Medicare IM given:    Discharge Disposition:  HOME/SELF CARE  Per UR Regulation:  Reviewed for med. necessity/level of care/duration of stay  If discussed at Long Length of Stay Meetings, dates discussed:    Comments:  09/03/12 10:54 Letha Cape RN, BSN [phone] patient is for dc today, Faxed sleep study referral , pt has sleep study scheduled for 09/28/11 at 8 pm.  CSW aware pt needs help with transportation.

## 2012-09-06 ENCOUNTER — Encounter: Payer: Self-pay | Admitting: Critical Care Medicine

## 2012-09-06 ENCOUNTER — Ambulatory Visit (INDEPENDENT_AMBULATORY_CARE_PROVIDER_SITE_OTHER): Payer: Medicaid Other | Admitting: Critical Care Medicine

## 2012-09-06 VITALS — BP 124/76 | HR 106 | Temp 98.4°F | Ht 65.0 in | Wt 200.0 lb

## 2012-09-06 DIAGNOSIS — Z720 Tobacco use: Secondary | ICD-10-CM

## 2012-09-06 DIAGNOSIS — J449 Chronic obstructive pulmonary disease, unspecified: Secondary | ICD-10-CM

## 2012-09-06 DIAGNOSIS — F17200 Nicotine dependence, unspecified, uncomplicated: Secondary | ICD-10-CM

## 2012-09-06 DIAGNOSIS — J441 Chronic obstructive pulmonary disease with (acute) exacerbation: Secondary | ICD-10-CM

## 2012-09-06 MED ORDER — FAMOTIDINE 40 MG PO TABS: 40.0000 mg | ORAL_TABLET | Freq: Every evening | ORAL | Status: DC

## 2012-09-06 NOTE — Progress Notes (Signed)
Subjective:    Patient ID: [name], female    DOB: 05/25/1951, 61 y.o.   MRN: [id number]  HPI Comments: Referred for Asthma/copd evaluations Pt in hospital 12/8- 12/10.   Rx nebs,oxygen, iv medrol.  D/C on ABX and Prednisone.  THis was first admit for same. Smoker now on nicotine patch  Shortness of Breath This is a chronic (dx asthma 2005) problem. The current episode started more than 1 year ago. The problem occurs constantly. The problem has been gradually worsening. Associated symptoms include orthopnea, PND, a sore throat, sputum production and wheezing. Pertinent negatives include no abdominal pain, chest pain, claudication, ear pain, fever, headaches, hemoptysis, leg pain, leg swelling, neck pain, rash, rhinorrhea, swollen glands, syncope or vomiting. The symptoms are aggravated by smoke, fumes, odors, exercise, any activity, emotional upset, URIs and weather changes. Associated symptoms comments: Severe cough with thick yellow mucus. Risk factors include smoking. She has tried beta agonist inhalers, steroid inhalers and oral steroids (ABX) for the symptoms. The treatment provided significant relief. Her past medical history is significant for allergies. There is no history of aspirin allergies, bronchiolitis, CAD, DVT, a heart failure, PE or a recent surgery.   CAT Score 09/06/2012  Total CAT Score 27      Past Medical History  Diagnosis Date  . Hypertension   . Diabetes mellitus   . High cholesterol   . Asthma   . COPD (chronic obstructive pulmonary disease)   . Hyperlipidemia   . Reflux   . Kidney infection   . Kidney stone   . Arthritis   . Heart murmur      Family History  Problem Relation Age of Onset  . CVA    . Hypertension    . Diabetes Mellitus II       History   Social History  . Marital Status: Single    Spouse Name: N/A    Number of Children: N/A  . Years of Education: N/A   Occupational History  . Not on file.   Social History Main  Topics  . Smoking status: Former Smoker -- 0.2 packs/day for 40 years    Types: Cigarettes    Quit date: 08/31/2012  . Smokeless tobacco: Never Used  . Alcohol Use: No  . Drug Use: No  . Sexually Active: Not on file   Other Topics Concern  . Not on file   Social History Narrative  . No narrative on file     Allergies  Allergen Reactions  . Azithromycin Anaphylaxis and Swelling    Eyes swell  . Cephalexin Anaphylaxis and Swelling  . Benadryl (Diphenhydramine Hcl) Nausea And Vomiting  . Nexium (Esomeprazole Magnesium) Palpitations  . Spiriva (Tiotropium Bromide Monohydrate) Palpitations     Outpatient Prescriptions Prior to Visit  Medication Sig Dispense Refill  . albuterol (PROVENTIL) (2.5 MG/3ML) 0.083% nebulizer solution Take 3 mLs (2.5 mg total) by nebulization 4 (four) times daily. For wheezing  75 mL  0  . albuterol-ipratropium (COMBIVENT) 18-103 MCG/ACT inhaler Inhale 2 puffs into the lungs every 6 (six) hours as needed for wheezing.  1 Inhaler  0  . aspirin EC 81 MG tablet Take 81 mg by mouth daily.       . benzonatate (TESSALON) 100 MG capsule Take 1 capsule (100 mg total) by mouth 3 (three) times daily as needed for cough.  20 capsule  0  . Fluticasone-Salmeterol (ADVAIR) 500-50 MCG/DOSE AEPB Inhale 1 puff into the lungs every 12 (twelve) hours.        Marland Kitchen  ipratropium (ATROVENT) 0.02 % nebulizer solution Take 2.5 mLs (0.5 mg total) by nebulization every 6 (six) hours.  75 mL  0  . metFORMIN (GLUCOPHAGE) 500 MG tablet Take 500 mg by mouth 2 (two) times daily with a meal.        . nicotine (NICODERM CQ - DOSED IN MG/24 HOURS) 14 mg/24hr patch Place 1 patch onto the skin daily.  28 patch  0  . pravastatin (PRAVACHOL) 40 MG tablet Take 40 mg by mouth at bedtime.      . [DISCONTINUED] levofloxacin (LEVAQUIN) 750 MG tablet Take 1 tablet (750 mg total) by mouth daily.  5 tablet  0  . [DISCONTINUED] predniSONE (DELTASONE) 10 MG tablet Take 4 tablets daily for 3 days, then Take 3  tablets daily for 3 days, then Take 2 tablets daily for 3 days, then Take 1 tablet daily for 2 days and then stop  29 tablet  0  . [DISCONTINUED] diltiazem (CARDIZEM CD) 240 MG 24 hr capsule Take 240 mg by mouth daily.        . [DISCONTINUED] docusate sodium (COLACE) 100 MG capsule Take 100 mg by mouth every morning.      . [DISCONTINUED] hydrochlorothiazide (HYDRODIURIL) 25 MG tablet Take 25 mg by mouth daily.       Last reviewed on 09/06/2012  2:31 PM by Storm Frisk, MD    Review of Systems  Constitutional: Negative for fever, chills, diaphoresis, activity change, appetite change, fatigue and unexpected weight change.  HENT: Positive for sore throat. Negative for hearing loss, ear pain, nosebleeds, congestion, facial swelling, rhinorrhea, sneezing, mouth sores, trouble swallowing, neck pain, neck stiffness, dental problem, voice change, postnasal drip, sinus pressure, tinnitus and ear discharge.   Eyes: Negative for photophobia, discharge, itching and visual disturbance.  Respiratory: Positive for cough, sputum production, chest tightness, shortness of breath and wheezing. Negative for apnea, hemoptysis, choking and stridor.   Cardiovascular: Positive for orthopnea and PND. Negative for chest pain, palpitations, claudication, leg swelling and syncope.  Gastrointestinal: Negative for nausea, vomiting, abdominal pain, constipation, blood in stool and abdominal distention.       Pt with GERD, pt will break through heartburn meds  Genitourinary: Negative for dysuria, urgency, frequency, hematuria, flank pain, decreased urine volume and difficulty urinating.  Musculoskeletal: Negative for myalgias, back pain, joint swelling, arthralgias and gait problem.  Skin: Negative for color change, pallor and rash.  Neurological: Negative for dizziness, tremors, seizures, syncope, speech difficulty, weakness, light-headedness, numbness and headaches.  Hematological: Negative for adenopathy. Does not  bruise/bleed easily.  Psychiatric/Behavioral: Negative for confusion, sleep disturbance and agitation. The patient is not nervous/anxious.        Objective:   Physical Exam  Filed Vitals:   09/06/12 1420  BP: 124/76  Pulse: 106  Temp: 98.4 F (36.9 C)  Height: 5\' 5"  (1.651 m)  Weight: 200 lb (90.719 kg)  SpO2: 96%    Gen: Pleasant, well-nourished, in no distress,  normal affect  ENT: No lesions,  mouth clear,  oropharynx clear, no postnasal drip  Neck: No JVD, no TMG, no carotid bruits  Lungs: No use of accessory muscles, no dullness to percussion, expired wheezes, poor airflow  Cardiovascular: RRR, heart sounds normal, no murmur or gallops, no peripheral edema  Abdomen: soft and NT, no HSM,  BS normal  Musculoskeletal: No deformities, no cyanosis or clubbing  Neuro: alert, non focal  Skin: Warm, no lesions or rashes  Recent chest x-ray from December 2013 reviewed and  revealed no active disease but chronic bronchitic change   Spirometry from 09/06/2012 reveals mild peripheral airflow obstruction      Assessment & Plan:   COPD (chronic obstructive pulmonary disease) 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 and prior history of smoking use The patient is currently attempting smoking cessation Recent hospitalization with asthmatic bronchitic flare now improving Note reflux disease likely playing a significant role Plan Start pepcid 40mg  at bedtime Follow STRICT reflux diet STay on Advair 500 one puff twice daily Stay on nebulizer 3-4 times daily for now Finish prednisone taper No more antibiotics Return 6 weeks   Tobacco abuse History of tobacco use now in the process of smoking cessation 3-10 minutes of smoking cessation counseling given to the patient   Updated Medication List Outpatient Encounter Prescriptions as of 09/06/2012  Medication Sig Dispense Refill  . albuterol (PROVENTIL) (2.5 MG/3ML) 0.083% nebulizer solution Take 3 mLs  (2.5 mg total) by nebulization 4 (four) times daily. For wheezing  75 mL  0  . albuterol-ipratropium (COMBIVENT) 18-103 MCG/ACT inhaler Inhale 2 puffs into the lungs every 6 (six) hours as needed for wheezing.  1 Inhaler  0  . amLODipine (NORVASC) 10 MG tablet Take 10 mg by mouth daily.      Marland Kitchen aspirin EC 81 MG tablet Take 81 mg by mouth daily.       . benzonatate (TESSALON) 100 MG capsule Take 1 capsule (100 mg total) by mouth 3 (three) times daily as needed for cough.  20 capsule  0  . Fluticasone-Salmeterol (ADVAIR) 500-50 MCG/DOSE AEPB Inhale 1 puff into the lungs every 12 (twelve) hours.        Marland Kitchen ipratropium (ATROVENT) 0.02 % nebulizer solution Take 2.5 mLs (0.5 mg total) by nebulization every 6 (six) hours.  75 mL  0  . lubiprostone (AMITIZA) 24 MCG capsule Take 24 mcg by mouth daily with breakfast.      . metFORMIN (GLUCOPHAGE) 500 MG tablet Take 500 mg by mouth 2 (two) times daily with a meal.        . nicotine (NICODERM CQ - DOSED IN MG/24 HOURS) 14 mg/24hr patch Place 1 patch onto the skin daily.  28 patch  0  . Ondansetron (ZUPLENZ) 4 MG FILM Take 4 mg by mouth as needed.      . potassium chloride (K-DUR) 10 MEQ tablet Take 10 mEq by mouth daily.      . pravastatin (PRAVACHOL) 40 MG tablet Take 40 mg by mouth at bedtime.      . predniSONE (DELTASONE) 10 MG tablet Take 10 mg by mouth. As directed - taper      . [DISCONTINUED] levofloxacin (LEVAQUIN) 750 MG tablet Take 1 tablet (750 mg total) by mouth daily.  5 tablet  0  . [DISCONTINUED] predniSONE (DELTASONE) 10 MG tablet Take 4 tablets daily for 3 days, then Take 3 tablets daily for 3 days, then Take 2 tablets daily for 3 days, then Take 1 tablet daily for 2 days and then stop  29 tablet  0  . famotidine (PEPCID) 40 MG tablet Take 1 tablet (40 mg total) by mouth every evening.  30 tablet  6  . [DISCONTINUED] diltiazem (CARDIZEM CD) 240 MG 24 hr capsule Take 240 mg by mouth daily.        . [DISCONTINUED] docusate sodium (COLACE) 100 MG  capsule Take 100 mg by mouth every morning.      . [DISCONTINUED] hydrochlorothiazide (HYDRODIURIL) 25 MG tablet Take 25  mg by mouth daily.

## 2012-09-06 NOTE — Patient Instructions (Addendum)
Start pepcid 40mg  at bedtime Follow STRICT reflux diet STay on Advair 500 one puff twice daily Stay on nebulizer 3-4 times daily for now Finish prednisone taper No more antibiotics Return 6 weeks

## 2012-09-06 NOTE — Assessment & Plan Note (Signed)
History of tobacco use now in the process of smoking cessation 3-10 minutes of smoking cessation counseling given to the patient

## 2012-09-06 NOTE — Assessment & Plan Note (Signed)
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

## 2012-09-27 ENCOUNTER — Ambulatory Visit (HOSPITAL_BASED_OUTPATIENT_CLINIC_OR_DEPARTMENT_OTHER): Payer: Medicaid Other | Attending: Internal Medicine | Admitting: Radiology

## 2012-09-27 VITALS — Ht 65.0 in | Wt 195.0 lb

## 2012-09-27 DIAGNOSIS — G4730 Sleep apnea, unspecified: Secondary | ICD-10-CM | POA: Insufficient documentation

## 2012-09-29 DIAGNOSIS — G4730 Sleep apnea, unspecified: Secondary | ICD-10-CM

## 2012-09-29 DIAGNOSIS — R0609 Other forms of dyspnea: Secondary | ICD-10-CM

## 2012-09-29 DIAGNOSIS — R0989 Other specified symptoms and signs involving the circulatory and respiratory systems: Secondary | ICD-10-CM

## 2012-09-29 DIAGNOSIS — G4700 Insomnia, unspecified: Secondary | ICD-10-CM

## 2012-09-29 NOTE — Procedures (Cosign Needed)
NAME:  [name], [name] NO.:  [id number]  MEDICAL RECORD NO.:  [id number]          PATIENT TYPE:  OUT  LOCATION:  SLEEP CENTER                 FACILITY:  [facility]  PHYSICIAN:  Clinton D. Maple Hudson, MD, FCCP, FACPDATE OF BIRTH:  1950/10/31  DATE OF STUDY:  09/27/2012                           NOCTURNAL POLYSOMNOGRAM  REFERRING PHYSICIAN:  Jeoffrey Massed, MD  INDICATION FOR STUDY:  Insomnia with sleep apnea.  EPWORTH SLEEPINESS SCORE:  14/24.  BMI 32.4.  Weight 195 pounds, height 65 inches, neck 14.5 inches.  MEDICATIONS:  Home medications are charted and reviewed.  SLEEP ARCHITECTURE:  Total sleep time 370.5 minutes with sleep efficiency 88.1%.  Stage I was 5.1%, stage II 69.1%, stage III absent, REM 25.8% of total sleep time.  Sleep latency 11.5 minutes, REM latency 85.5 minutes, awake after sleep onset 37.5 minutes.  Arousal index 6.6.  BEDTIME MEDICATION:  None.  RESPIRATORY DATA:  Apnea-hypopnea index (AHI) 3.6 per hour.  A total of 22 events was scored including 11 obstructive apneas, 5 central apneas and 6 hypopneas.  Events were seen in all sleep positions, but more often while supine and in REM.  REM AHI 10.7 per hour.  There were insufficient numbers of events to qualify for split protocol CPAP titration on the study night.  OXYGEN DATA:  Mild-to-moderately loud snoring with oxygen desaturation to a nadir of 88% and mean oxygen saturation through the study of 93.5% on room air.  CARDIAC DATA:  Sinus rhythm with rare PAC.  MOVEMENT-PARASOMNIA:  No significant movement disturbance.  Bathroom x1.  IMPRESSIONS-RECOMMENDATIONS: 1. Sleep was reasonably well maintained through the study for an     unfamiliar environment with no bedtime medication.  She indicated     that her sleep problem was "cannot get comfortable, wake up off and     on through the night.". 2. Occasional respiratory events with sleep disturbance, within normal     limits.  AHI 3.6 per  hour (the normal     range for adults is from 0-5 events per hour).  Mild-to-moderately     loud snoring with oxygen desaturation to a nadir of 88% and mean     oxygen saturation through the study of 93.5% on room air.     Clinton D. Maple Hudson, MD, Tonny Bollman, FACP Diplomate, American Board of Sleep Medicine    CDY/MEDQ  D:  09/29/2012 10:47:23  T:  09/29/2012 22:05:43  Job:  [id number]

## 2012-10-18 ENCOUNTER — Ambulatory Visit: Payer: Medicaid Other | Admitting: Critical Care Medicine

## 2012-11-01 ENCOUNTER — Encounter: Payer: Self-pay | Admitting: Critical Care Medicine

## 2012-11-01 ENCOUNTER — Ambulatory Visit (INDEPENDENT_AMBULATORY_CARE_PROVIDER_SITE_OTHER): Payer: Medicaid Other | Admitting: Critical Care Medicine

## 2012-11-01 VITALS — BP 114/58 | HR 93 | Temp 98.6°F | Ht 65.0 in | Wt 195.0 lb

## 2012-11-01 DIAGNOSIS — F17200 Nicotine dependence, unspecified, uncomplicated: Secondary | ICD-10-CM

## 2012-11-01 DIAGNOSIS — Z720 Tobacco use: Secondary | ICD-10-CM

## 2012-11-01 DIAGNOSIS — J449 Chronic obstructive pulmonary disease, unspecified: Secondary | ICD-10-CM

## 2012-11-01 MED ORDER — NICOTINE POLACRILEX 4 MG MT LOZG: 4.0000 mg | LOZENGE | OROMUCOSAL | Status: DC | PRN

## 2012-11-01 MED ORDER — BENZONATATE 100 MG PO CAPS: 100.0000 mg | ORAL_CAPSULE | Freq: Three times a day (TID) | ORAL | Status: DC | PRN

## 2012-11-01 MED ORDER — ALBUTEROL SULFATE (2.5 MG/3ML) 0.083% IN NEBU: 2.5000 mg | INHALATION_SOLUTION | Freq: Two times a day (BID) | RESPIRATORY_TRACT | Status: DC

## 2012-11-01 MED ORDER — IPRATROPIUM BROMIDE 0.02 % IN SOLN: 0.5000 mg | Freq: Two times a day (BID) | RESPIRATORY_TRACT | Status: DC

## 2012-11-01 NOTE — Assessment & Plan Note (Addendum)
COPD gold stage A with asthmatic bronchitic component Improved at this time, ongoing tobacco use noted Plan Use nicorette minis 4mg  4-6 times daily as needed for smoking Reduce nebulizer to 2 times daily and as needed Stay on all other inhalers Refill on benzonatate sent to pharmacy Return 3 months

## 2012-11-01 NOTE — Patient Instructions (Addendum)
Use nicorette minis 4mg  4-6 times daily as needed for smoking Reduce nebulizer to 2 times daily and as needed Stay on all other inhalers Refill on benzonatate sent to pharmacy Return 3 months

## 2012-11-01 NOTE — Assessment & Plan Note (Signed)
Ongoing tobacco use with current smoking activity Plan 3- 10 minutes of smoking cessation counseling was issued to the patient I have recommended the patient began Nicorette minis for nicotine replacement there

## 2012-11-01 NOTE — Progress Notes (Signed)
Subjective:    Patient ID: [name], female    DOB: [birth year], 62 y.o.   MRN: [id number]  HPI CAT Score 11/01/2012 09/06/2012  Total CAT Score 24 27    11/01/2012 Since the last office visit the patient is improved. There is less cough. There is less shortness of breath. The patient is still actively smoking however. Pt is better compared to last time.  If pt awakens will go through a breathing treatment. Now still smoking,  Pt relapsed off cigarettes. Weight gained and could not breath. Now notes some mucus with neb med.  No real chest pains.  Notes some dyspnea with housework.    Past Medical History  Diagnosis Date  . Hypertension   . Diabetes mellitus   . High cholesterol   . Asthma   . COPD (chronic obstructive pulmonary disease)   . Hyperlipidemia   . Reflux   . Kidney infection   . Kidney stone   . Arthritis   . Heart murmur      Family History  Problem Relation Age of Onset  . CVA    . Hypertension    . Diabetes Mellitus II       History   Social History  . Marital Status: Single    Spouse Name: N/A    Number of Children: N/A  . Years of Education: N/A   Occupational History  . Not on file.   Social History Main Topics  . Smoking status: Current Every Day Smoker -- 0.3 packs/day    Types: Cigarettes  . Smokeless tobacco: Never Used     Comment: Started smoking at age 52.  Currently smoking 4 cig per day  . Alcohol Use: No  . Drug Use: No  . Sexually Active: Not on file   Other Topics Concern  . Not on file   Social History Narrative  . No narrative on file     Allergies  Allergen Reactions  . Azithromycin Anaphylaxis and Swelling    Eyes swell  . Cephalexin Anaphylaxis and Swelling  . Benadryl (Diphenhydramine Hcl) Nausea And Vomiting  . Nexium (Esomeprazole Magnesium) Palpitations  . Spiriva (Tiotropium Bromide Monohydrate) Palpitations     Outpatient Prescriptions Prior to Visit  Medication Sig Dispense Refill  .  albuterol-ipratropium (COMBIVENT) 18-103 MCG/ACT inhaler Inhale 2 puffs into the lungs every 6 (six) hours as needed for wheezing.  1 Inhaler  0  . amLODipine (NORVASC) 10 MG tablet Take 10 mg by mouth daily.      Marland Kitchen aspirin EC 81 MG tablet Take 81 mg by mouth daily.       . famotidine (PEPCID) 40 MG tablet Take 1 tablet (40 mg total) by mouth every evening.  30 tablet  6  . Fluticasone-Salmeterol (ADVAIR) 500-50 MCG/DOSE AEPB Inhale 1 puff into the lungs every 12 (twelve) hours.        Marland Kitchen lubiprostone (AMITIZA) 24 MCG capsule Take 24 mcg by mouth daily with breakfast.      . metFORMIN (GLUCOPHAGE) 500 MG tablet Take 500 mg by mouth 2 (two) times daily with a meal.        . Ondansetron (ZUPLENZ) 4 MG FILM Take 4 mg by mouth as needed.      . potassium chloride (K-DUR) 10 MEQ tablet Take 10 mEq by mouth daily.      . pravastatin (PRAVACHOL) 40 MG tablet Take 40 mg by mouth at bedtime.      . [DISCONTINUED] albuterol (PROVENTIL) (2.5 MG/3ML)  0.083% nebulizer solution Take 3 mLs (2.5 mg total) by nebulization 4 (four) times daily. For wheezing  75 mL  0  . [DISCONTINUED] ipratropium (ATROVENT) 0.02 % nebulizer solution Take 2.5 mLs (0.5 mg total) by nebulization every 6 (six) hours.  75 mL  0  . [DISCONTINUED] benzonatate (TESSALON) 100 MG capsule Take 1 capsule (100 mg total) by mouth 3 (three) times daily as needed for cough.  20 capsule  0  . [DISCONTINUED] nicotine (NICODERM CQ - DOSED IN MG/24 HOURS) 14 mg/24hr patch Place 1 patch onto the skin daily.  28 patch  0  . [DISCONTINUED] predniSONE (DELTASONE) 10 MG tablet Take 10 mg by mouth. As directed - taper      Last reviewed on 11/01/2012  3:35 PM by Storm Frisk, MD    Review of Systems  Constitutional: Negative for chills, diaphoresis, activity change, appetite change, fatigue and unexpected weight change.  HENT: Negative for hearing loss, nosebleeds, congestion, facial swelling, sneezing, mouth sores, trouble swallowing, neck stiffness,  dental problem, voice change, postnasal drip, sinus pressure, tinnitus and ear discharge.   Eyes: Negative for photophobia, discharge, itching and visual disturbance.  Respiratory: Positive for cough and chest tightness. Negative for apnea, choking and stridor.   Cardiovascular: Negative for palpitations.  Gastrointestinal: Negative for nausea, constipation, blood in stool and abdominal distention.       Pt with GERD, pt will break through heartburn meds  Genitourinary: Negative for dysuria, urgency, frequency, hematuria, flank pain, decreased urine volume and difficulty urinating.  Musculoskeletal: Negative for myalgias, back pain, joint swelling, arthralgias and gait problem.  Skin: Negative for color change and pallor.  Neurological: Negative for dizziness, tremors, seizures, syncope, speech difficulty, weakness, light-headedness and numbness.  Hematological: Negative for adenopathy. Does not bruise/bleed easily.  Psychiatric/Behavioral: Negative for confusion, sleep disturbance and agitation. The patient is not nervous/anxious.        Objective:   Physical Exam   Filed Vitals:   11/01/12 1515  BP: 114/58  Pulse: 93  Temp: 98.6 F (37 C)  TempSrc: Oral  Height: 5\' 5"  (1.651 m)  Weight: 195 lb (88.451 kg)  SpO2: 96%    Gen: Pleasant, well-nourished, in no distress,  normal affect  ENT: No lesions,  mouth clear,  oropharynx clear, no postnasal drip  Neck: No JVD, no TMG, no carotid bruits  Lungs: No use of accessory muscles, no dullness to percussion, improved airflow with decreased wheezes Cardiovascular: RRR, heart sounds normal, no murmur or gallops, no peripheral edema  Abdomen: soft and NT, no HSM,  BS normal  Musculoskeletal: No deformities, no cyanosis or clubbing  Neuro: alert, non focal  Skin: Warm, no lesions or rashes     Assessment & Plan:   COPD (chronic obstructive pulmonary disease) COPD gold stage A with asthmatic bronchitic component Improved at  this time, ongoing tobacco use noted Plan Use nicorette minis 4mg  4-6 times daily as needed for smoking Reduce nebulizer to 2 times daily and as needed Stay on all other inhalers Refill on benzonatate sent to pharmacy Return 3 months   Tobacco abuse Ongoing tobacco use with current smoking activity Plan 3- 10 minutes of smoking cessation counseling was issued to the patient I have recommended the patient began Nicorette minis for nicotine replacement there    Updated Medication List Outpatient Encounter Prescriptions as of 11/01/2012  Medication Sig Dispense Refill  . albuterol (PROVENTIL) (2.5 MG/3ML) 0.083% nebulizer solution Take 3 mLs (2.5 mg total) by nebulization 2 (two)  times daily. And as needed For wheezing  75 mL  0  . albuterol-ipratropium (COMBIVENT) 18-103 MCG/ACT inhaler Inhale 2 puffs into the lungs every 6 (six) hours as needed for wheezing.  1 Inhaler  0  . amLODipine (NORVASC) 10 MG tablet Take 10 mg by mouth daily.      . Ascorbic Acid (VITAMIN C PO) Take 1 tablet by mouth daily.      Marland Kitchen aspirin EC 81 MG tablet Take 81 mg by mouth daily.       . famotidine (PEPCID) 40 MG tablet Take 1 tablet (40 mg total) by mouth every evening.  30 tablet  6  . Fluticasone-Salmeterol (ADVAIR) 500-50 MCG/DOSE AEPB Inhale 1 puff into the lungs every 12 (twelve) hours.        Marland Kitchen ipratropium (ATROVENT) 0.02 % nebulizer solution Take 2.5 mLs (0.5 mg total) by nebulization 2 (two) times daily. And as needed  75 mL  0  . lubiprostone (AMITIZA) 24 MCG capsule Take 24 mcg by mouth daily with breakfast.      . metFORMIN (GLUCOPHAGE) 500 MG tablet Take 500 mg by mouth 2 (two) times daily with a meal.        . Omega-3 Fatty Acids (FISH OIL PO) Take 1 capsule by mouth daily.      . Ondansetron (ZUPLENZ) 4 MG FILM Take 4 mg by mouth as needed.      . potassium chloride (K-DUR) 10 MEQ tablet Take 10 mEq by mouth daily.      . pravastatin (PRAVACHOL) 40 MG tablet Take 40 mg by mouth at bedtime.       . [DISCONTINUED] albuterol (PROVENTIL) (2.5 MG/3ML) 0.083% nebulizer solution Take 3 mLs (2.5 mg total) by nebulization 4 (four) times daily. For wheezing  75 mL  0  . [DISCONTINUED] ipratropium (ATROVENT) 0.02 % nebulizer solution Take 2.5 mLs (0.5 mg total) by nebulization every 6 (six) hours.  75 mL  0  . benzonatate (TESSALON) 100 MG capsule Take 1 capsule (100 mg total) by mouth 3 (three) times daily as needed for cough.  60 capsule  6  . nicotine polacrilex (COMMIT) 4 MG lozenge Place 1 lozenge (4 mg total) inside cheek as needed for smoking cessation.  48 tablet  4  . [DISCONTINUED] benzonatate (TESSALON) 100 MG capsule Take 1 capsule (100 mg total) by mouth 3 (three) times daily as needed for cough.  20 capsule  0  . [DISCONTINUED] benzonatate (TESSALON) 100 MG capsule Take 1 capsule (100 mg total) by mouth 3 (three) times daily as needed for cough.  60 capsule  6  . [DISCONTINUED] nicotine (NICODERM CQ - DOSED IN MG/24 HOURS) 14 mg/24hr patch Place 1 patch onto the skin daily.  28 patch  0  . [DISCONTINUED] predniSONE (DELTASONE) 10 MG tablet Take 10 mg by mouth. As directed - taper

## 2013-01-22 ENCOUNTER — Emergency Department (HOSPITAL_BASED_OUTPATIENT_CLINIC_OR_DEPARTMENT_OTHER)
Admission: EM | Admit: 2013-01-22 | Discharge: 2013-01-22 | Disposition: A | Discharge status: Home / Self Care | Payer: Medicaid Other | Attending: Emergency Medicine | Admitting: Emergency Medicine

## 2013-01-22 ENCOUNTER — Emergency Department (HOSPITAL_BASED_OUTPATIENT_CLINIC_OR_DEPARTMENT_OTHER): Payer: Medicaid Other

## 2013-01-22 ENCOUNTER — Encounter (HOSPITAL_BASED_OUTPATIENT_CLINIC_OR_DEPARTMENT_OTHER): Payer: Self-pay | Admitting: *Deleted

## 2013-01-22 DIAGNOSIS — F17200 Nicotine dependence, unspecified, uncomplicated: Secondary | ICD-10-CM | POA: Insufficient documentation

## 2013-01-22 DIAGNOSIS — J441 Chronic obstructive pulmonary disease with (acute) exacerbation: Secondary | ICD-10-CM | POA: Insufficient documentation

## 2013-01-22 DIAGNOSIS — J449 Chronic obstructive pulmonary disease, unspecified: Secondary | ICD-10-CM

## 2013-01-22 DIAGNOSIS — Z7982 Long term (current) use of aspirin: Secondary | ICD-10-CM | POA: Insufficient documentation

## 2013-01-22 DIAGNOSIS — Z87442 Personal history of urinary calculi: Secondary | ICD-10-CM | POA: Insufficient documentation

## 2013-01-22 DIAGNOSIS — E119 Type 2 diabetes mellitus without complications: Secondary | ICD-10-CM | POA: Insufficient documentation

## 2013-01-22 DIAGNOSIS — K219 Gastro-esophageal reflux disease without esophagitis: Secondary | ICD-10-CM | POA: Insufficient documentation

## 2013-01-22 DIAGNOSIS — Z8739 Personal history of other diseases of the musculoskeletal system and connective tissue: Secondary | ICD-10-CM | POA: Insufficient documentation

## 2013-01-22 DIAGNOSIS — R011 Cardiac murmur, unspecified: Secondary | ICD-10-CM | POA: Insufficient documentation

## 2013-01-22 DIAGNOSIS — E785 Hyperlipidemia, unspecified: Secondary | ICD-10-CM | POA: Insufficient documentation

## 2013-01-22 DIAGNOSIS — E7800 Pure hypercholesterolemia, unspecified: Secondary | ICD-10-CM | POA: Insufficient documentation

## 2013-01-22 DIAGNOSIS — Z79899 Other long term (current) drug therapy: Secondary | ICD-10-CM | POA: Insufficient documentation

## 2013-01-22 DIAGNOSIS — Z87448 Personal history of other diseases of urinary system: Secondary | ICD-10-CM | POA: Insufficient documentation

## 2013-01-22 DIAGNOSIS — J45901 Unspecified asthma with (acute) exacerbation: Secondary | ICD-10-CM | POA: Insufficient documentation

## 2013-01-22 DIAGNOSIS — I10 Essential (primary) hypertension: Secondary | ICD-10-CM | POA: Insufficient documentation

## 2013-01-22 DIAGNOSIS — R0789 Other chest pain: Secondary | ICD-10-CM | POA: Insufficient documentation

## 2013-01-22 DIAGNOSIS — IMO0002 Reserved for concepts with insufficient information to code with codable children: Secondary | ICD-10-CM | POA: Insufficient documentation

## 2013-01-22 MED ORDER — ALBUTEROL SULFATE (5 MG/ML) 0.5% IN NEBU
2.5000 mg | INHALATION_SOLUTION | Freq: Once | RESPIRATORY_TRACT | Status: AC
  Administered 2013-01-22: 2.5 mg via RESPIRATORY_TRACT
  Filled 2013-01-22: qty 0.5

## 2013-01-22 NOTE — ED Notes (Signed)
Pt ambulated to radiology dept in NAD, resps even and unlabored.

## 2013-01-22 NOTE — ED Provider Notes (Signed)
History     CSN: [id number]  Arrival date & time 01/22/13  2101   First MD Initiated Contact with Patient 01/22/13 2115      Chief Complaint  Patient presents with  . Shortness of Breath    (Consider location/radiation/quality/duration/timing/severity/associated sxs/prior treatment) Patient is a 62 y.o. female presenting with shortness of breath.  Shortness of Breath Associated symptoms: chest pain   Associated symptoms: no diaphoresis and no rash     Patient presents complaining of chest discomfort on the left side.  She has COPD and thought it might be related to her COPD.  She says it started when she was sitting still watching TV today at 4 PM.  She says it has come and gone several times since then.  She describes it as a twinge.  She denies substernal chest pain, diaphoresis, nausea, worsening with exertion or improvement with rest.  She says she might feel a little more short of breath than normal.    Past Medical History  Diagnosis Date  . Hypertension   . Diabetes mellitus   . High cholesterol   . Asthma   . COPD (chronic obstructive pulmonary disease)   . Hyperlipidemia   . Reflux   . Kidney infection   . Kidney stone   . Arthritis   . Heart murmur     Past Surgical History  Procedure Laterality Date  . Fillopian tube removal    . Right breast cyst removal      Family History  Problem Relation Age of Onset  . CVA    . Hypertension    . Diabetes Mellitus II     History  Substance Use Topics  . Smoking status: Current Every Day Smoker -- 0.30 packs/day    Types: Cigarettes  . Smokeless tobacco: Never Used     Comment: Started smoking at age 78.  Currently smoking 4 cig per day  . Alcohol Use: No   Review of Systems  Constitutional: Negative for diaphoresis.  Respiratory: Positive for chest tightness and shortness of breath.   Cardiovascular: Positive for chest pain. Negative for palpitations.  Gastrointestinal: Negative for nausea.   Musculoskeletal: Negative for back pain.  Skin: Negative for rash.  Neurological: Negative for dizziness and weakness.    Allergies  Azithromycin; Cephalexin; Benadryl; Nexium; and Spiriva  Home Medications   Current Outpatient Rx  Name  Route  Sig  Dispense  Refill  . HYDROCHLOROTHIAZIDE PO   Oral   Take by mouth.         Marland Kitchen albuterol (PROVENTIL) (2.5 MG/3ML) 0.083% nebulizer solution   Nebulization   Take 3 mLs (2.5 mg total) by nebulization 2 (two) times daily. And as needed For wheezing   75 mL   0   . albuterol-ipratropium (COMBIVENT) 18-103 MCG/ACT inhaler   Inhalation   Inhale 2 puffs into the lungs every 6 (six) hours as needed for wheezing.   1 Inhaler   0   . amLODipine (NORVASC) 10 MG tablet   Oral   Take 10 mg by mouth daily.         . Ascorbic Acid (VITAMIN C PO)   Oral   Take 1 tablet by mouth daily.         Marland Kitchen aspirin EC 81 MG tablet   Oral   Take 81 mg by mouth daily.          . benzonatate (TESSALON) 100 MG capsule   Oral   Take 1 capsule (100  mg total) by mouth 3 (three) times daily as needed for cough.   60 capsule   6   . famotidine (PEPCID) 40 MG tablet   Oral   Take 1 tablet (40 mg total) by mouth every evening.   30 tablet   6   . Fluticasone-Salmeterol (ADVAIR) 500-50 MCG/DOSE AEPB   Inhalation   Inhale 1 puff into the lungs every 12 (twelve) hours.           Marland Kitchen ipratropium (ATROVENT) 0.02 % nebulizer solution   Nebulization   Take 2.5 mLs (0.5 mg total) by nebulization 2 (two) times daily. And as needed   75 mL   0   . lubiprostone (AMITIZA) 24 MCG capsule   Oral   Take 24 mcg by mouth daily with breakfast.         . metFORMIN (GLUCOPHAGE) 500 MG tablet   Oral   Take 500 mg by mouth 2 (two) times daily with a meal.           . nicotine polacrilex (COMMIT) 4 MG lozenge   Buccal   Place 1 lozenge (4 mg total) inside cheek as needed for smoking cessation.   48 tablet   4   . Omega-3 Fatty Acids (FISH OIL  PO)   Oral   Take 1 capsule by mouth daily.         . Ondansetron (ZUPLENZ) 4 MG FILM   Oral   Take 4 mg by mouth as needed.         . potassium chloride (K-DUR) 10 MEQ tablet   Oral   Take 10 mEq by mouth daily.         . pravastatin (PRAVACHOL) 40 MG tablet   Oral   Take 40 mg by mouth at bedtime.           BP 140/77  Pulse 60  Temp(Src) 98.9 F (37.2 C) (Oral)  Resp 20  Wt 195 lb (88.451 kg)  BMI 32.45 kg/m2  SpO2 98%  Physical Exam  Vitals reviewed. Constitutional: She is oriented to person, place, and time. She appears well-developed and well-nourished. No distress.  HENT:  Head: Normocephalic and atraumatic.  Mouth/Throat: Oropharynx is clear and moist.  Neck: Normal range of motion. No JVD present.  Cardiovascular: Normal rate, regular rhythm and normal heart sounds.   No murmur heard. Pulmonary/Chest: Effort normal and breath sounds normal. She has no wheezes.  Abdominal: Soft. Bowel sounds are normal. There is no tenderness.  Musculoskeletal: She exhibits no edema.  Neurological: She is alert and oriented to person, place, and time.    ED Course  Procedures (including critical care time)  Labs Reviewed - No data to display Dg Chest 2 View  01/22/2013  *RADIOLOGY REPORT*  Clinical Data: Left-sided chest discomfort.  Palpitations.  Short of breath.  CHEST - 2 VIEW  Comparison: 09/01/2012.  Findings:   Cardiopericardial silhouette within normal limits. Mediastinal contours normal. Trachea midline.  No airspace disease or effusion.  IMPRESSION: No active cardiopulmonary disease.   Original Report Authenticated By: Andreas Newport, M.D.      Date: 01/22/2013  Rate: 54  Rhythm: normal sinus rhythm  QRS Axis: normal  Intervals: normal  ST/T Wave abnormalities: normal  Conduction Disutrbances:none  Narrative Interpretation: Sinus Bradycardia  Old EKG Reviewed: unchanged    1. Non-cardiac chest pain   2. COPD (chronic obstructive pulmonary  disease) gold stage a with ongoing tobacco use       MDM  Symptoms very atypical, and reproducible on exam.  Feel this is likely musculoskeletal.  Discussed diagnosis with patient and advised close follow up with PCP.         Ardyth Gal, MD 01/22/13 2226

## 2013-01-22 NOTE — ED Notes (Addendum)
Sob since this evening. Last used her inhaler at 11 am. States she has a "funny" sensation in left chest.

## 2013-01-22 NOTE — ED Provider Notes (Signed)
I have personally seen and examined the patient.  I have discussed the plan of care with the resident.  I have reviewed the documentation on PMH/FH/Soc. History.  I have reviewed the documentation of the resident and agree.  I have reviewed and agree with the ECG interpretation(s) documented by the resident.  CP is reproducible and very atypical (told me it lasted "seconds") she is in no distress I doubt ACS/PE/Dissection at this time  Joya Gaskins, MD 01/22/13 2229

## 2013-04-26 ENCOUNTER — Emergency Department (HOSPITAL_BASED_OUTPATIENT_CLINIC_OR_DEPARTMENT_OTHER)
Admission: EM | Admit: 2013-04-26 | Discharge: 2013-04-26 | Disposition: A | Discharge status: Home / Self Care | Payer: Medicaid Other | Attending: Emergency Medicine | Admitting: Emergency Medicine

## 2013-04-26 ENCOUNTER — Encounter (HOSPITAL_BASED_OUTPATIENT_CLINIC_OR_DEPARTMENT_OTHER): Payer: Self-pay | Admitting: *Deleted

## 2013-04-26 ENCOUNTER — Emergency Department (HOSPITAL_BASED_OUTPATIENT_CLINIC_OR_DEPARTMENT_OTHER): Payer: Medicaid Other

## 2013-04-26 DIAGNOSIS — Z87448 Personal history of other diseases of urinary system: Secondary | ICD-10-CM | POA: Insufficient documentation

## 2013-04-26 DIAGNOSIS — Z87442 Personal history of urinary calculi: Secondary | ICD-10-CM | POA: Insufficient documentation

## 2013-04-26 DIAGNOSIS — K219 Gastro-esophageal reflux disease without esophagitis: Secondary | ICD-10-CM | POA: Insufficient documentation

## 2013-04-26 DIAGNOSIS — M129 Arthropathy, unspecified: Secondary | ICD-10-CM | POA: Insufficient documentation

## 2013-04-26 DIAGNOSIS — F17200 Nicotine dependence, unspecified, uncomplicated: Secondary | ICD-10-CM | POA: Insufficient documentation

## 2013-04-26 DIAGNOSIS — E7800 Pure hypercholesterolemia, unspecified: Secondary | ICD-10-CM | POA: Insufficient documentation

## 2013-04-26 DIAGNOSIS — J45901 Unspecified asthma with (acute) exacerbation: Secondary | ICD-10-CM | POA: Insufficient documentation

## 2013-04-26 DIAGNOSIS — E876 Hypokalemia: Secondary | ICD-10-CM | POA: Insufficient documentation

## 2013-04-26 DIAGNOSIS — R0789 Other chest pain: Secondary | ICD-10-CM | POA: Insufficient documentation

## 2013-04-26 DIAGNOSIS — E785 Hyperlipidemia, unspecified: Secondary | ICD-10-CM | POA: Insufficient documentation

## 2013-04-26 DIAGNOSIS — Z7982 Long term (current) use of aspirin: Secondary | ICD-10-CM | POA: Insufficient documentation

## 2013-04-26 DIAGNOSIS — Z79899 Other long term (current) drug therapy: Secondary | ICD-10-CM | POA: Insufficient documentation

## 2013-04-26 DIAGNOSIS — I10 Essential (primary) hypertension: Secondary | ICD-10-CM | POA: Insufficient documentation

## 2013-04-26 DIAGNOSIS — E119 Type 2 diabetes mellitus without complications: Secondary | ICD-10-CM | POA: Insufficient documentation

## 2013-04-26 DIAGNOSIS — J441 Chronic obstructive pulmonary disease with (acute) exacerbation: Secondary | ICD-10-CM | POA: Insufficient documentation

## 2013-04-26 DIAGNOSIS — R011 Cardiac murmur, unspecified: Secondary | ICD-10-CM | POA: Insufficient documentation

## 2013-04-26 LAB — CBC WITH DIFFERENTIAL/PLATELET
Basophils Absolute: 0 10*3/uL (ref 0.0–0.1)
HCT: 37.8 % (ref 36.0–46.0)
Hemoglobin: 12.3 g/dL (ref 12.0–15.0)
Lymphocytes Relative: 40 % (ref 12–46)
Monocytes Absolute: 0.8 10*3/uL (ref 0.1–1.0)
Monocytes Relative: 6 % (ref 3–12)
Neutro Abs: 6.3 10*3/uL (ref 1.7–7.7)
RDW: 14.9 % (ref 11.5–15.5)
WBC: 12.3 10*3/uL — ABNORMAL HIGH (ref 4.0–10.5)

## 2013-04-26 LAB — BASIC METABOLIC PANEL
CO2: 31 mEq/L (ref 19–32)
Chloride: 100 mEq/L (ref 96–112)
Creatinine, Ser: 0.8 mg/dL (ref 0.50–1.10)
Potassium: 3.1 mEq/L — ABNORMAL LOW (ref 3.5–5.1)

## 2013-04-26 LAB — TROPONIN I: Troponin I: <0.3 ng/mL (ref <0.30)

## 2013-04-26 MED ORDER — ASPIRIN 81 MG PO CHEW
324.0000 mg | CHEWABLE_TABLET | Freq: Once | ORAL | Status: AC
  Administered 2013-04-26: 324 mg via ORAL
  Filled 2013-04-26: qty 4

## 2013-04-26 MED ORDER — ALBUTEROL SULFATE (5 MG/ML) 0.5% IN NEBU
5.0000 mg | INHALATION_SOLUTION | Freq: Once | RESPIRATORY_TRACT | Status: AC
  Administered 2013-04-26: 5 mg via RESPIRATORY_TRACT
  Filled 2013-04-26: qty 1

## 2013-04-26 MED ORDER — POTASSIUM CHLORIDE CRYS ER 20 MEQ PO TBCR: 20.0000 meq | EXTENDED_RELEASE_TABLET | Freq: Two times a day (BID) | ORAL | Status: DC

## 2013-04-26 MED ORDER — POTASSIUM CHLORIDE CRYS ER 20 MEQ PO TBCR
40.0000 meq | EXTENDED_RELEASE_TABLET | Freq: Once | ORAL | Status: AC
  Administered 2013-04-26: 40 meq via ORAL
  Filled 2013-04-26: qty 1

## 2013-04-26 NOTE — ED Provider Notes (Signed)
CSN: [id number]     Arrival date & time 04/26/13  0151 History     First MD Initiated Contact with Patient 04/26/13 0217     Chief Complaint  Patient presents with  . Chest Pain   (Consider location/radiation/quality/duration/timing/severity/associated sxs/prior Treatment) Patient is a 62 y.o. female presenting with chest pain. The history is provided by the patient.  Chest Pain She has had about 6 episodes of a funny feeling in the left side of her chest since the morning of July 31. Episodes of calm on at rest, with one episode waking her up. It lasts for about 2 minutes before resolving. When asked to describe it in, and she just states it is a funny feeling that she doesn't normally have. She was unable to characterize it any further. There is associated dyspnea but no nausea or diaphoresis. Nothing makes the discomfort better nothing makes it worse. She's never had anything like this before. She did take her usual dose of aspirin 81 mg this morning. She does have cardiac risk factors of tobacco abuse, diabetes, hypertension, hyperlipidemia and positive family history of coronary artery disease. She had a brother who had coronary bypass surgery at age 15.  Past Medical History  Diagnosis Date  . Hypertension   . Diabetes mellitus   . High cholesterol   . Asthma   . COPD (chronic obstructive pulmonary disease)   . Hyperlipidemia   . Reflux   . Kidney infection   . Kidney stone   . Arthritis   . Heart murmur    Past Surgical History  Procedure Laterality Date  . Fillopian tube removal    . Right breast cyst removal     Family History  Problem Relation Age of Onset  . CVA    . Hypertension    . Diabetes Mellitus II     History  Substance Use Topics  . Smoking status: Current Every Day Smoker -- 0.30 packs/day    Types: Cigarettes  . Smokeless tobacco: Never Used     Comment: Started smoking at age 89.  Currently smoking 4 cig per day  . Alcohol Use: No   OB History   Grav Para Term Preterm Abortions TAB SAB Ect Mult Living                 Review of Systems  Cardiovascular: Positive for chest pain.  All other systems reviewed and are negative.    Allergies  Azithromycin; Cephalexin; Benadryl; Nexium; and Spiriva  Home Medications   Current Outpatient Rx  Name  Route  Sig  Dispense  Refill  . albuterol (PROVENTIL) (2.5 MG/3ML) 0.083% nebulizer solution   Nebulization   Take 3 mLs (2.5 mg total) by nebulization 2 (two) times daily. And as needed For wheezing   75 mL   0   . EXPIRED: albuterol-ipratropium (COMBIVENT) 18-103 MCG/ACT inhaler   Inhalation   Inhale 2 puffs into the lungs every 6 (six) hours as needed for wheezing.   1 Inhaler   0   . amLODipine (NORVASC) 10 MG tablet   Oral   Take 10 mg by mouth daily.         . Ascorbic Acid (VITAMIN C PO)   Oral   Take 1 tablet by mouth daily.         Marland Kitchen aspirin EC 81 MG tablet   Oral   Take 81 mg by mouth daily.          . benzonatate (TESSALON)  100 MG capsule   Oral   Take 1 capsule (100 mg total) by mouth 3 (three) times daily as needed for cough.   60 capsule   6   . famotidine (PEPCID) 40 MG tablet   Oral   Take 1 tablet (40 mg total) by mouth every evening.   30 tablet   6   . Fluticasone-Salmeterol (ADVAIR) 500-50 MCG/DOSE AEPB   Inhalation   Inhale 1 puff into the lungs every 12 (twelve) hours.           Marland Kitchen HYDROCHLOROTHIAZIDE PO   Oral   Take by mouth.         Marland Kitchen ipratropium (ATROVENT) 0.02 % nebulizer solution   Nebulization   Take 2.5 mLs (0.5 mg total) by nebulization 2 (two) times daily. And as needed   75 mL   0   . lubiprostone (AMITIZA) 24 MCG capsule   Oral   Take 24 mcg by mouth daily with breakfast.         . metFORMIN (GLUCOPHAGE) 500 MG tablet   Oral   Take 500 mg by mouth 2 (two) times daily with a meal.           . nicotine polacrilex (COMMIT) 4 MG lozenge   Buccal   Place 1 lozenge (4 mg total) inside cheek as needed for  smoking cessation.   48 tablet   4   . Omega-3 Fatty Acids (FISH OIL PO)   Oral   Take 1 capsule by mouth daily.         . Ondansetron (ZUPLENZ) 4 MG FILM   Oral   Take 4 mg by mouth as needed.         . potassium chloride (K-DUR) 10 MEQ tablet   Oral   Take 10 mEq by mouth daily.         . pravastatin (PRAVACHOL) 40 MG tablet   Oral   Take 40 mg by mouth at bedtime.          BP 136/72  Pulse 67  Temp(Src) 98 F (36.7 C) (Oral)  Resp 16  Wt 195 lb (88.451 kg)  BMI 32.45 kg/m2  SpO2 98% Physical Exam  Nursing note and vitals reviewed.  62 year old female, resting comfortably and in no acute distress. Vital signs are normal. Oxygen saturation is 98%, which is normal. Head is normocephalic and atraumatic. PERRLA, EOMI. Oropharynx is clear. Neck is nontender and supple without adenopathy or JVD. Back is nontender and there is no CVA tenderness. Lungs are clear without rales, wheezes, or rhonchi. Chest is nontender. Heart has regular rate and rhythm without murmur. Abdomen is soft, flat, nontender without masses or hepatosplenomegaly and peristalsis is normoactive. Extremities have no cyanosis or edema, full range of motion is present. Skin is warm and dry without rash. Neurologic: Mental status is normal, cranial nerves are intact, there are no motor or sensory deficits.  ED Course   Procedures (including critical care time)  Results for orders placed during the hospital encounter of 04/26/13  CBC WITH DIFFERENTIAL      Result Value Range   WBC 12.3 (*) 4.0 - 10.5 K/uL   RBC 4.55  3.87 - 5.11 MIL/uL   Hemoglobin 12.3  12.0 - 15.0 g/dL   HCT 40.9  81.1 - 91.4 %   MCV 83.1  78.0 - 100.0 fL   MCH 27.0  26.0 - 34.0 pg   MCHC 32.5  30.0 - 36.0 g/dL  RDW 14.9  11.5 - 15.5 %   Platelets 218  150 - 400 K/uL   Neutrophils Relative % 51  43 - 77 %   Neutro Abs 6.3  1.7 - 7.7 K/uL   Lymphocytes Relative 40  12 - 46 %   Lymphs Abs 5.0 (*) 0.7 - 4.0 K/uL    Monocytes Relative 6  3 - 12 %   Monocytes Absolute 0.8  0.1 - 1.0 K/uL   Eosinophils Relative 2  0 - 5 %   Eosinophils Absolute 0.2  0.0 - 0.7 K/uL   Basophils Relative 0  0 - 1 %   Basophils Absolute 0.0  0.0 - 0.1 K/uL  BASIC METABOLIC PANEL      Result Value Range   Sodium 141  135 - 145 mEq/L   Potassium 3.1 (*) 3.5 - 5.1 mEq/L   Chloride 100  96 - 112 mEq/L   CO2 31  19 - 32 mEq/L   Glucose, Bld 101 (*) 70 - 99 mg/dL   BUN 20  6 - 23 mg/dL   Creatinine, Ser 1.61  0.50 - 1.10 mg/dL   Calcium 09.6 (*) 8.4 - 10.5 mg/dL   GFR calc non Af Amer 79 (*) >90 mL/min   GFR calc Af Amer >90  >90 mL/min  TROPONIN I      Result Value Range   Troponin I <0.30  <0.30 ng/mL   Dg Chest 2 View  04/26/2013   *RADIOLOGY REPORT*  Clinical Data: Chest pain.  Short of breath.  Cigarette smoker.  CHEST - 2 VIEW  Comparison: 01/22/2013.  Multiple priors dating back to 08/10/2007.  Findings: Suboptimal inspiration with basilar atelectasis. Cardiopericardial silhouette is within normal limits. No pneumothorax. Monitoring leads are projected over the chest. Mediastinal contours appear within normal limits.  Mild thoracic spondylosis appears similar.  IMPRESSION: No acute cardiopulmonary disease.  Mild basilar atelectasis.   Original Report Authenticated By: Andreas Newport, M.D.      Date: 04/26/2013  Rate: 56  Rhythm: sinus bradycardia  QRS Axis: normal  Intervals: normal  ST/T Wave abnormalities: normal  Conduction Disutrbances:none  Narrative Interpretation: Sinus bradycardia, otherwise normal ECG. When compared with ECG of 01/22/2013, no significant changes are seen.  Old EKG Reviewed: unchanged   1. Chest discomfort   2. Hypokalemia     MDM  Chest discomfort which does not have any red flags worrisome for cardiac disease. However, she does have significant cardiac risk factors. Chest x-ray and troponin will be obtained and I anticipate referring her back to her PCP to consider outpatient  stress testing. Old records are reviewed and she does have a prior ED visit for noncardiac chest pain.  Workup is negative except for hypokalemia. She is on a relatively low dose of potassium. This will be increased from 10 mEq twice a day to 20 mEq twice a day. She is referred back to her PCP to arrange for outpatient stress testing.  Dione Booze, MD 04/26/13 [phone]

## 2013-04-26 NOTE — ED Notes (Signed)
Patient transported to X-ray 

## 2013-04-26 NOTE — ED Notes (Signed)
Pt c/o of a feeling in her left chest that she describes as "not normal" but cannot detail what type of feeling. Pt denies n/v, and diaphoresis but sts some SOB.

## 2013-09-10 ENCOUNTER — Encounter: Payer: Self-pay | Admitting: Critical Care Medicine

## 2013-09-10 ENCOUNTER — Ambulatory Visit (INDEPENDENT_AMBULATORY_CARE_PROVIDER_SITE_OTHER): Payer: Medicaid Other | Admitting: Critical Care Medicine

## 2013-09-10 VITALS — BP 122/74 | HR 76 | Temp 97.5°F | Ht 65.0 in | Wt 193.0 lb

## 2013-09-10 DIAGNOSIS — F17200 Nicotine dependence, unspecified, uncomplicated: Secondary | ICD-10-CM

## 2013-09-10 DIAGNOSIS — Z720 Tobacco use: Secondary | ICD-10-CM

## 2013-09-10 DIAGNOSIS — J449 Chronic obstructive pulmonary disease, unspecified: Secondary | ICD-10-CM

## 2013-09-10 MED ORDER — PREDNISONE 10 MG PO TABS: ORAL_TABLET | ORAL | Status: DC

## 2013-09-10 NOTE — Patient Instructions (Signed)
Stay on advair 500 one puff twice daily You MUST stop smoking, try Nicorette Minis 4mg  lozenge , use 5-6 per day Prednisone 10mg  Take 4 tablets daily for 5 days then stop Albuterol as needed You do not need oxygen Call us with name of clinic you attended at [facility]  Return 4 months

## 2013-09-10 NOTE — Assessment & Plan Note (Signed)
Gold stage B. COPD with asthmatic bronchitic component and ongoing smoking use No need for oxygen therapy at this time Plan Stay on advair 500 one puff twice daily You MUST stop smoking, try Nicorette Minis 4mg  lozenge , use 5-6 per day Prednisone 10mg  Take 4 tablets daily for 5 days then stop Albuterol as needed Return 4 months

## 2013-09-10 NOTE — Progress Notes (Signed)
Subjective:    Patient ID: [name], female    DOB: May 26, 1951, 62 y.o.   MRN: [id number]  HPI  CAT Score 09/10/2013 11/01/2012 09/06/2012  Total CAT Score 20 24 27    09/10/2013 Chief Complaint  Patient presents with  . Follow-up    OV requested by Dr. Quita Skye office.  Pt reports breathing is a little worse since last OV in Feb 2014.  Has increased chest tightness and at times notices increased DOE.  Also reports wheezing.  No cough.  Copd f/u [facility].  Not seen since 10/2012 Pt notes more chest tightness.  No real cough.  Notes more wheezing. Notes dyspnea if walking , doing laundry or sweeping. Occ QHS dyspnea with humidity.  Mucus is present and hard to raise.  No real sinus pressure.  No ABX courses over the past year. Pt still smoking.  Pt using chewing tobacco now.  Now 5-6 cig per day.    Past Medical History  Diagnosis Date  . Hypertension   . Diabetes mellitus   . High cholesterol   . Asthma   . COPD (chronic obstructive pulmonary disease)   . Hyperlipidemia   . Reflux   . Kidney infection   . Kidney stone   . Arthritis   . Heart murmur      Family History  Problem Relation Age of Onset  . CVA    . Hypertension    . Diabetes Mellitus II       History   Social History  . Marital Status: Single    Spouse Name: N/A    Number of Children: N/A  . Years of Education: N/A   Occupational History  . Not on file.   Social History Main Topics  . Smoking status: Current Every Day Smoker -- 0.25 packs/day    Types: Cigarettes  . Smokeless tobacco: Never Used     Comment: Started smoking at age 85.  Currently smoking 5-6 cig per day  . Alcohol Use: No  . Drug Use: No  . Sexual Activity: Not on file   Other Topics Concern  . Not on file   Social History Narrative  . No narrative on file     Allergies  Allergen Reactions  . Azithromycin Anaphylaxis and Swelling    Eyes swell  . Cephalexin Anaphylaxis and Swelling  . Benadryl [Diphenhydramine Hcl]  Nausea And Vomiting  . Nexium [Esomeprazole Magnesium] Palpitations  . Spiriva [Tiotropium Bromide Monohydrate] Palpitations     Outpatient Prescriptions Prior to Visit  Medication Sig Dispense Refill  . albuterol (PROVENTIL) (2.5 MG/3ML) 0.083% nebulizer solution Take 3 mLs (2.5 mg total) by nebulization 2 (two) times daily. And as needed For wheezing  75 mL  0  . albuterol-ipratropium (COMBIVENT) 18-103 MCG/ACT inhaler Inhale 2 puffs into the lungs every 6 (six) hours as needed for wheezing.  1 Inhaler  0  . amLODipine (NORVASC) 10 MG tablet Take 10 mg by mouth daily.      Marland Kitchen aspirin EC 81 MG tablet Take 81 mg by mouth daily.       . benzonatate (TESSALON) 100 MG capsule Take 1 capsule (100 mg total) by mouth 3 (three) times daily as needed for cough.  60 capsule  6  . Fluticasone-Salmeterol (ADVAIR) 500-50 MCG/DOSE AEPB Inhale 1 puff into the lungs every 12 (twelve) hours.        Marland Kitchen HYDROCHLOROTHIAZIDE PO Take 1 tablet by mouth daily.       Marland Kitchen ipratropium (  ATROVENT) 0.02 % nebulizer solution Take 2.5 mLs (0.5 mg total) by nebulization 2 (two) times daily. And as needed  75 mL  0  . lubiprostone (AMITIZA) 24 MCG capsule Take 24 mcg by mouth daily with breakfast.      . metFORMIN (GLUCOPHAGE) 500 MG tablet Take 500 mg by mouth 2 (two) times daily with a meal.        . Ondansetron (ZUPLENZ) 4 MG FILM Take 4 mg by mouth as needed.      . potassium chloride SA (K-DUR,KLOR-CON) 20 MEQ tablet Take 1 tablet (20 mEq total) by mouth 2 (two) times daily.  60 tablet  0  . pravastatin (PRAVACHOL) 40 MG tablet Take 40 mg by mouth at bedtime.      . famotidine (PEPCID) 40 MG tablet Take 1 tablet (40 mg total) by mouth every evening.  30 tablet  6  . Ascorbic Acid (VITAMIN C PO) Take 1 tablet by mouth daily.      . nicotine polacrilex (COMMIT) 4 MG lozenge Place 1 lozenge (4 mg total) inside cheek as needed for smoking cessation.  48 tablet  4  . Omega-3 Fatty Acids (FISH OIL PO) Take 1 capsule by mouth  daily.      . potassium chloride (K-DUR) 10 MEQ tablet Take 10 mEq by mouth daily.       No facility-administered medications prior to visit.      Review of Systems  Constitutional: Negative for chills, diaphoresis, activity change, appetite change, fatigue and unexpected weight change.  HENT: Negative for congestion, dental problem, ear discharge, facial swelling, hearing loss, mouth sores, nosebleeds, postnasal drip, sinus pressure, sneezing, tinnitus, trouble swallowing and voice change.   Eyes: Negative for photophobia, discharge, itching and visual disturbance.  Respiratory: Positive for cough and chest tightness. Negative for apnea, choking and stridor.   Cardiovascular: Negative for palpitations.  Gastrointestinal: Negative for nausea, constipation, blood in stool and abdominal distention.       Pt with GERD, pt will break through heartburn meds  Genitourinary: Negative for dysuria, urgency, frequency, hematuria, flank pain, decreased urine volume and difficulty urinating.  Musculoskeletal: Negative for arthralgias, back pain, gait problem, joint swelling, myalgias and neck stiffness.  Skin: Negative for color change and pallor.  Neurological: Negative for dizziness, tremors, seizures, syncope, speech difficulty, weakness, light-headedness and numbness.  Hematological: Negative for adenopathy. Does not bruise/bleed easily.  Psychiatric/Behavioral: Negative for confusion, sleep disturbance and agitation. The patient is not nervous/anxious.        Objective:   Physical Exam   Filed Vitals:   09/10/13 1036  BP: 122/74  Pulse: 76  Temp: 97.5 F (36.4 C)  TempSrc: Oral  Height: 5\' 5"  (1.651 m)  Weight: 193 lb (87.544 kg)  SpO2: 95%    Gen: Pleasant, well-nourished, in no distress,  normal affect  ENT: No lesions,  mouth clear,  oropharynx clear, no postnasal drip  Neck: No JVD, no TMG, no carotid bruits  Lungs: No use of accessory muscles, no dullness to percussion,  poor airflow  Cardiovascular: RRR, heart sounds normal, no murmur or gallops, no peripheral edema  Abdomen: soft and NT, no HSM,  BS normal  Musculoskeletal: No deformities, no cyanosis or clubbing  Neuro: alert, non focal  Skin: Warm, no lesions or rashes     Assessment & Plan:   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 COPD with asthmatic bronchitic component and ongoing smoking use No  need for oxygen therapy at this time Plan Stay on advair 500 one puff twice daily You MUST stop smoking, try Nicorette Minis 4mg  lozenge , use 5-6 per day Prednisone 10mg  Take 4 tablets daily for 5 days then stop Albuterol as needed Return 4 months   Tobacco abuse Ongoing tobacco use Plan Greater than 10 minutes smoking cessation counseling was given to the patient The patient will use Nicorette minis as nicotine replacement therapy    Updated Medication List Outpatient Encounter Prescriptions as of 09/10/2013  Medication Sig  . albuterol (PROVENTIL) (2.5 MG/3ML) 0.083% nebulizer solution Take 3 mLs (2.5 mg total) by nebulization 2 (two) times daily. And as needed For wheezing  . albuterol-ipratropium (COMBIVENT) 18-103 MCG/ACT inhaler Inhale 2 puffs into the lungs every 6 (six) hours as needed for wheezing.  Marland Kitchen amLODipine (NORVASC) 10 MG tablet Take 10 mg by mouth daily.  Marland Kitchen aspirin EC 81 MG tablet Take 81 mg by mouth daily.   . benzonatate (TESSALON) 100 MG capsule Take 1 capsule (100 mg total) by mouth 3 (three) times daily as needed for cough.  . famotidine (PEPCID) 40 MG tablet Take 40 mg by mouth daily as needed.  . Fluticasone-Salmeterol (ADVAIR) 500-50 MCG/DOSE AEPB Inhale 1 puff into the lungs every 12 (twelve) hours.    Marland Kitchen HYDROCHLOROTHIAZIDE PO Take 1 tablet by mouth daily.   Marland Kitchen ipratropium (ATROVENT) 0.02 % nebulizer solution Take 2.5 mLs (0.5 mg total) by nebulization 2 (two) times daily. And as needed  . lubiprostone  (AMITIZA) 24 MCG capsule Take 24 mcg by mouth daily with breakfast.  . metFORMIN (GLUCOPHAGE) 500 MG tablet Take 500 mg by mouth 2 (two) times daily with a meal.    . Ondansetron (ZUPLENZ) 4 MG FILM Take 4 mg by mouth as needed.  . potassium chloride SA (K-DUR,KLOR-CON) 20 MEQ tablet Take 1 tablet (20 mEq total) by mouth 2 (two) times daily.  . pravastatin (PRAVACHOL) 40 MG tablet Take 40 mg by mouth at bedtime.  . [DISCONTINUED] famotidine (PEPCID) 40 MG tablet Take 1 tablet (40 mg total) by mouth every evening.  . predniSONE (DELTASONE) 10 MG tablet Take 4 tablets daily for 5 days then stop  . [DISCONTINUED] Ascorbic Acid (VITAMIN C PO) Take 1 tablet by mouth daily.  . [DISCONTINUED] nicotine polacrilex (COMMIT) 4 MG lozenge Place 1 lozenge (4 mg total) inside cheek as needed for smoking cessation.  . [DISCONTINUED] Omega-3 Fatty Acids (FISH OIL PO) Take 1 capsule by mouth daily.  . [DISCONTINUED] potassium chloride (K-DUR) 10 MEQ tablet Take 10 mEq by mouth daily.

## 2013-09-10 NOTE — Assessment & Plan Note (Signed)
Ongoing tobacco use Plan Greater than 10 minutes smoking cessation counseling was given to the patient The patient will use Nicorette minis as nicotine replacement therapy

## 2013-11-22 ENCOUNTER — Other Ambulatory Visit: Payer: Self-pay | Admitting: *Deleted

## 2013-11-22 MED ORDER — BENZONATATE 100 MG PO CAPS: 100.0000 mg | ORAL_CAPSULE | Freq: Three times a day (TID) | ORAL | Status: DC | PRN

## 2014-02-15 ENCOUNTER — Emergency Department (HOSPITAL_BASED_OUTPATIENT_CLINIC_OR_DEPARTMENT_OTHER)
Admission: EM | Admit: 2014-02-15 | Discharge: 2014-02-15 | Disposition: A | Discharge status: Home / Self Care | Payer: Medicaid Other | Attending: Emergency Medicine | Admitting: Emergency Medicine

## 2014-02-15 ENCOUNTER — Emergency Department (HOSPITAL_BASED_OUTPATIENT_CLINIC_OR_DEPARTMENT_OTHER): Payer: Medicaid Other

## 2014-02-15 ENCOUNTER — Encounter (HOSPITAL_BASED_OUTPATIENT_CLINIC_OR_DEPARTMENT_OTHER): Payer: Self-pay | Admitting: Emergency Medicine

## 2014-02-15 DIAGNOSIS — R011 Cardiac murmur, unspecified: Secondary | ICD-10-CM | POA: Insufficient documentation

## 2014-02-15 DIAGNOSIS — J449 Chronic obstructive pulmonary disease, unspecified: Secondary | ICD-10-CM | POA: Insufficient documentation

## 2014-02-15 DIAGNOSIS — I10 Essential (primary) hypertension: Secondary | ICD-10-CM | POA: Insufficient documentation

## 2014-02-15 DIAGNOSIS — M25561 Pain in right knee: Secondary | ICD-10-CM

## 2014-02-15 DIAGNOSIS — F17200 Nicotine dependence, unspecified, uncomplicated: Secondary | ICD-10-CM | POA: Insufficient documentation

## 2014-02-15 DIAGNOSIS — M1710 Unilateral primary osteoarthritis, unspecified knee: Secondary | ICD-10-CM | POA: Insufficient documentation

## 2014-02-15 DIAGNOSIS — J4489 Other specified chronic obstructive pulmonary disease: Secondary | ICD-10-CM | POA: Insufficient documentation

## 2014-02-15 DIAGNOSIS — IMO0002 Reserved for concepts with insufficient information to code with codable children: Secondary | ICD-10-CM | POA: Insufficient documentation

## 2014-02-15 DIAGNOSIS — E785 Hyperlipidemia, unspecified: Secondary | ICD-10-CM | POA: Insufficient documentation

## 2014-02-15 DIAGNOSIS — Z79899 Other long term (current) drug therapy: Secondary | ICD-10-CM | POA: Insufficient documentation

## 2014-02-15 DIAGNOSIS — Z7982 Long term (current) use of aspirin: Secondary | ICD-10-CM | POA: Insufficient documentation

## 2014-02-15 DIAGNOSIS — E7800 Pure hypercholesterolemia, unspecified: Secondary | ICD-10-CM | POA: Insufficient documentation

## 2014-02-15 DIAGNOSIS — Z87440 Personal history of urinary (tract) infections: Secondary | ICD-10-CM | POA: Insufficient documentation

## 2014-02-15 DIAGNOSIS — Z87442 Personal history of urinary calculi: Secondary | ICD-10-CM | POA: Insufficient documentation

## 2014-02-15 DIAGNOSIS — E119 Type 2 diabetes mellitus without complications: Secondary | ICD-10-CM | POA: Insufficient documentation

## 2014-02-15 DIAGNOSIS — K219 Gastro-esophageal reflux disease without esophagitis: Secondary | ICD-10-CM | POA: Insufficient documentation

## 2014-02-15 MED ORDER — HYDROCODONE-ACETAMINOPHEN 5-325 MG PO TABS: 1.0000 | ORAL_TABLET | ORAL | Status: DC | PRN

## 2014-02-15 MED ORDER — NAPROXEN 500 MG PO TABS: 500.0000 mg | ORAL_TABLET | Freq: Two times a day (BID) | ORAL | Status: DC

## 2014-02-15 NOTE — ED Provider Notes (Signed)
CSN: [id number]     Arrival date & time 02/15/14  1610 History   First MD Initiated Contact with Patient 02/15/14 1647     Chief Complaint  Patient presents with  . Knee Pain     (Consider location/radiation/quality/duration/timing/severity/associated sxs/prior Treatment) Patient is a 63 y.o. female presenting with knee pain. The history is provided by the patient. No language interpreter was used.  Knee Pain Location:  Knee Knee location:  R knee Associated symptoms: no fever   Associated symptoms comment:  Pain and swelling in the right knee worse than usual pain in knee that she has intermittently. No injury.    Past Medical History  Diagnosis Date  . Hypertension   . Diabetes mellitus   . High cholesterol   . Asthma   . COPD (chronic obstructive pulmonary disease)   . Hyperlipidemia   . Reflux   . Kidney infection   . Kidney stone   . Arthritis   . Heart murmur    Past Surgical History  Procedure Laterality Date  . Fillopian tube removal    . Right breast cyst removal     Family History  Problem Relation Age of Onset  . CVA    . Hypertension    . Diabetes Mellitus II     History  Substance Use Topics  . Smoking status: Current Every Day Smoker -- 0.25 packs/day    Types: Cigarettes  . Smokeless tobacco: Never Used     Comment: Started smoking at age 65.  Currently smoking 5-6 cig per day  . Alcohol Use: No   OB History   Grav Para Term Preterm Abortions TAB SAB Ect Mult Living                 Review of Systems  Constitutional: Negative for fever and chills.  Musculoskeletal:       See HPI.  Skin: Positive for color change. Negative for wound.       Complains of redness to the knee.   Neurological: Negative.  Negative for numbness.      Allergies  Azithromycin; Cephalexin; Benadryl; Nexium; and Spiriva  Home Medications   Prior to Admission medications   Medication Sig Start Date End Date Taking? Authorizing Provider  albuterol (PROVENTIL)  (2.5 MG/3ML) 0.083% nebulizer solution Take 3 mLs (2.5 mg total) by nebulization 2 (two) times daily. And as needed For wheezing 11/01/12   Storm Frisk, MD  albuterol-ipratropium [facility]) [phone] MCG/ACT inhaler Inhale 2 puffs into the lungs every 6 (six) hours as needed for wheezing. 02/03/12   April K Palumbo-Rasch, MD  amLODipine (NORVASC) 10 MG tablet Take 10 mg by mouth daily.    Historical Provider, MD  aspirin EC 81 MG tablet Take 81 mg by mouth daily.     Historical Provider, MD  famotidine (PEPCID) 40 MG tablet Take 40 mg by mouth daily as needed. 09/06/12   Storm Frisk, MD  Fluticasone-Salmeterol (ADVAIR) 500-50 MCG/DOSE AEPB Inhale 1 puff into the lungs every 12 (twelve) hours.      Historical Provider, MD  HYDROCHLOROTHIAZIDE PO Take 1 tablet by mouth daily.     Historical Provider, MD  ipratropium (ATROVENT) 0.02 % nebulizer solution Take 2.5 mLs (0.5 mg total) by nebulization 2 (two) times daily. And as needed 11/01/12   Storm Frisk, MD  lubiprostone (AMITIZA) 24 MCG capsule Take 24 mcg by mouth daily with breakfast.    Historical Provider, MD  metFORMIN (GLUCOPHAGE) 500 MG tablet Take  500 mg by mouth 2 (two) times daily with a meal.      Historical Provider, MD  Ondansetron (ZUPLENZ) 4 MG FILM Take 4 mg by mouth as needed.    Historical Provider, MD  potassium chloride SA (K-DUR,KLOR-CON) 20 MEQ tablet Take 1 tablet (20 mEq total) by mouth 2 (two) times daily. 04/26/13   Dione Booze, MD  pravastatin (PRAVACHOL) 40 MG tablet Take 40 mg by mouth at bedtime.    Historical Provider, MD   BP 119/69  Pulse 77  Temp(Src) 98.1 F (36.7 C) (Oral)  Resp 18  SpO2 98% Physical Exam  Constitutional: She is oriented to person, place, and time. She appears well-developed and well-nourished. No distress.  Musculoskeletal:  Right knee moderately swollen. Joint stable without laxity. Increased pain medially with flexion. No calf or thigh tenderness.   Neurological: She is oriented to  person, place, and time.    ED Course  Procedures (including critical care time) Labs Review Labs Reviewed - No data to display  Imaging Review Dg Knee Complete 4 Views Right  02/15/2014   CLINICAL DATA:  63 year old female with right knee pain. No known injury.  EXAM: RIGHT KNEE - COMPLETE 4+ VIEW  COMPARISON:  None.  FINDINGS: There is no evidence of fracture, subluxation or dislocation.  Mild tricompartmental joint space narrowing is present.  No focal bony lesions are identified.  There is no evidence of joint effusion.  IMPRESSION: No evidence of acute abnormality.  Mild tricompartmental degenerative changes.   Electronically Signed   By: Laveda Abbe M.D.   On: 02/15/2014 16:45     EKG Interpretation None      MDM   Final diagnoses:  None    1. Knee arthritis  The knee appears red, however, there has been an ice pack on the knee since arrival in ED. No evidence of septic joint - fever, red streaking into thigh.     Arnoldo Hooker, PA-C 02/15/14 1756

## 2014-02-15 NOTE — ED Provider Notes (Signed)
Medical screening examination/treatment/procedure(s) were performed by non-physician practitioner and as supervising physician I was immediately available for consultation/collaboration.   EKG Interpretation None        Rolan Bucco, MD 02/15/14 2352

## 2014-02-15 NOTE — Discharge Instructions (Signed)
Arthralgia °Your caregiver has diagnosed you as suffering from an arthralgia. Arthralgia means there is pain in a joint. This can come from many reasons including: °· Bruising the joint which causes soreness (inflammation) in the joint. °· Wear and tear on the joints which occur as we grow older (osteoarthritis). °· Overusing the joint. °· Various forms of arthritis. °· Infections of the joint. °Regardless of the cause of pain in your joint, most of these different pains respond to anti-inflammatory drugs and rest. The exception to this is when a joint is infected, and these cases are treated with antibiotics, if it is a bacterial infection. °HOME CARE INSTRUCTIONS  °· Rest the injured area for as long as directed by your caregiver. Then slowly start using the joint as directed by your caregiver and as the pain allows. Crutches as directed may be useful if the ankles, knees or hips are involved. If the knee was splinted or casted, continue use and care as directed. If an stretchy or elastic wrapping bandage has been applied today, it should be removed and re-applied every 3 to 4 hours. It should not be applied tightly, but firmly enough to keep swelling down. Watch toes and feet for swelling, bluish discoloration, coldness, numbness or excessive pain. If any of these problems (symptoms) occur, remove the ace bandage and re-apply more loosely. If these symptoms persist, contact your caregiver or return to this location. °· For the first 24 hours, keep the injured extremity elevated on pillows while lying down. °· Apply ice for 15-20 minutes to the sore joint every couple hours while awake for the first half day. Then 03-04 times per day for the first 48 hours. Put the ice in a plastic bag and place a towel between the bag of ice and your skin. °· Wear any splinting, casting, elastic bandage applications, or slings as instructed. °· Only take over-the-counter or prescription medicines for pain, discomfort, or fever as  directed by your caregiver. Do not use aspirin immediately after the injury unless instructed by your physician. Aspirin can cause increased bleeding and bruising of the tissues. °· If you were given crutches, continue to use them as instructed and do not resume weight bearing on the sore joint until instructed. °Persistent pain and inability to use the sore joint as directed for more than 2 to 3 days are warning signs indicating that you should see a caregiver for a follow-up visit as soon as possible. Initially, a hairline fracture (break in bone) may not be evident on X-rays. Persistent pain and swelling indicate that further evaluation, non-weight bearing or use of the joint (use of crutches or slings as instructed), or further X-rays are indicated. X-rays may sometimes not show a small fracture until a week or 10 days later. Make a follow-up appointment with your own caregiver or one to whom we have referred you. A radiologist (specialist in reading X-rays) may read your X-rays. Make sure you know how you are to obtain your X-ray results. Do not assume everything is normal if you do not hear from us. °SEEK MEDICAL CARE IF: °Bruising, swelling, or pain increases. °SEEK IMMEDIATE MEDICAL CARE IF:  °· Your fingers or toes are numb or blue. °· The pain is not responding to medications and continues to stay the same or get worse. °· The pain in your joint becomes severe. °· You develop a fever over 102° F (38.9° C). °· It becomes impossible to move or use the joint. °MAKE SURE YOU:  °·   Understand these instructions. °· Will watch your condition. °· Will get help right away if you are not doing well or get worse. °Document Released: 09/12/2005 Document Revised: 12/05/2011 Document Reviewed: 04/30/2008 °ExitCare® Patient Information ©2014 [employer]. ° °Cryotherapy °Cryotherapy means treatment with cold. Ice or gel packs can be used to reduce both pain and swelling. Ice is the most helpful within the first 24 to 48  hours after an injury or flareup from overusing a muscle or joint. Sprains, strains, spasms, burning pain, shooting pain, and aches can all be eased with ice. Ice can also be used when recovering from surgery. Ice is effective, has very few side effects, and is safe for most people to use. °PRECAUTIONS  °Ice is not a safe treatment option for people with: °· Raynaud's phenomenon. This is a condition affecting small blood vessels in the extremities. Exposure to cold may cause your problems to return. °· Cold hypersensitivity. There are many forms of cold hypersensitivity, including: °· Cold urticaria. Red, itchy hives appear on the skin when the tissues begin to warm after being iced. °· Cold erythema. This is a red, itchy rash caused by exposure to cold. °· Cold hemoglobinuria. Red blood cells break down when the tissues begin to warm after being iced. The hemoglobin that carry oxygen are passed into the urine because they cannot combine with blood proteins fast enough. °· Numbness or altered sensitivity in the area being iced. °If you have any of the following conditions, do not use ice until you have discussed cryotherapy with your caregiver: °· Heart conditions, such as arrhythmia, angina, or chronic heart disease. °· High blood pressure. °· Healing wounds or open skin in the area being iced. °· Current infections. °· Rheumatoid arthritis. °· Poor circulation. °· Diabetes. °Ice slows the blood flow in the region it is applied. This is beneficial when trying to stop inflamed tissues from spreading irritating chemicals to surrounding tissues. However, if you expose your skin to cold temperatures for too long or without the proper protection, you can damage your skin or nerves. Watch for signs of skin damage due to cold. °HOME CARE INSTRUCTIONS °Follow these tips to use ice and cold packs safely. °· Place a dry or damp towel between the ice and skin. A damp towel will cool the skin more quickly, so you may need to  shorten the time that the ice is used. °· For a more rapid response, add gentle compression to the ice. °· Ice for no more than 10 to 20 minutes at a time. The bonier the area you are icing, the less time it will take to get the benefits of ice. °· Check your skin after 5 minutes to make sure there are no signs of a poor response to cold or skin damage. °· Rest 20 minutes or more in between uses. °· Once your skin is numb, you can end your treatment. You can test numbness by very lightly touching your skin. The touch should be so light that you do not see the skin dimple from the pressure of your fingertip. When using ice, most people will feel these normal sensations in this order: cold, burning, aching, and numbness. °· Do not use ice on someone who cannot communicate their responses to pain, such as small children or people with dementia. °HOW TO MAKE AN ICE PACK °Ice packs are the most common way to use ice therapy. Other methods include ice massage, ice baths, and cryo-sprays. Muscle creams that cause a   cold, tingly feeling do not offer the same benefits that ice offers and should not be used as a substitute unless recommended by your caregiver. °To make an ice pack, do one of the following: °· Place crushed ice or a bag of frozen vegetables in a sealable plastic bag. Squeeze out the excess air. Place this bag inside another plastic bag. Slide the bag into a pillowcase or place a damp towel between your skin and the bag. °· Mix 3 parts water with 1 part rubbing alcohol. Freeze the mixture in a sealable plastic bag. When you remove the mixture from the freezer, it will be slushy. Squeeze out the excess air. Place this bag inside another plastic bag. Slide the bag into a pillowcase or place a damp towel between your skin and the bag. °SEEK MEDICAL CARE IF: °· You develop white spots on your skin. This may give the skin a blotchy (mottled) appearance. °· Your skin turns blue or pale. °· Your skin becomes waxy or  hard. °· Your swelling gets worse. °MAKE SURE YOU:  °· Understand these instructions. °· Will watch your condition. °· Will get help right away if you are not doing well or get worse. °Document Released: 05/09/2011 Document Revised: 12/05/2011 Document Reviewed: 05/09/2011 °ExitCare® Patient Information ©2014 [employer]. ° °

## 2014-02-15 NOTE — ED Notes (Signed)
Patient here with right knee pain x the past several days, reports that she has had right heel problems and doesn't know if related, pain with ambulation

## 2014-05-14 DIAGNOSIS — M659 Unspecified synovitis and tenosynovitis, unspecified site: Secondary | ICD-10-CM | POA: Diagnosis not present

## 2014-05-14 DIAGNOSIS — I10 Essential (primary) hypertension: Secondary | ICD-10-CM | POA: Diagnosis not present

## 2014-05-14 DIAGNOSIS — M25569 Pain in unspecified knee: Secondary | ICD-10-CM | POA: Diagnosis not present

## 2014-05-14 DIAGNOSIS — E119 Type 2 diabetes mellitus without complications: Secondary | ICD-10-CM | POA: Diagnosis not present

## 2014-07-05 DIAGNOSIS — Z23 Encounter for immunization: Secondary | ICD-10-CM | POA: Diagnosis not present

## 2014-09-03 ENCOUNTER — Telehealth: Payer: Self-pay | Admitting: *Deleted

## 2014-09-03 DIAGNOSIS — Z1231 Encounter for screening mammogram for malignant neoplasm of breast: Secondary | ICD-10-CM | POA: Diagnosis not present

## 2014-09-03 DIAGNOSIS — R928 Other abnormal and inconclusive findings on diagnostic imaging of breast: Secondary | ICD-10-CM | POA: Diagnosis not present

## 2014-09-03 NOTE — Telephone Encounter (Signed)
Patient requesting refill of Tessalon Perles.  Not on current medication list.  Pt's last OV was 09/10/13.  Ok to send refill?

## 2014-09-04 ENCOUNTER — Ambulatory Visit (INDEPENDENT_AMBULATORY_CARE_PROVIDER_SITE_OTHER): Payer: Medicare Other | Admitting: Critical Care Medicine

## 2014-09-04 ENCOUNTER — Encounter: Payer: Self-pay | Admitting: Critical Care Medicine

## 2014-09-04 VITALS — BP 105/65 | HR 72 | Temp 98.1°F | Ht 64.0 in | Wt 191.8 lb

## 2014-09-04 DIAGNOSIS — Z720 Tobacco use: Secondary | ICD-10-CM

## 2014-09-04 DIAGNOSIS — F17210 Nicotine dependence, cigarettes, uncomplicated: Secondary | ICD-10-CM | POA: Diagnosis not present

## 2014-09-04 DIAGNOSIS — J441 Chronic obstructive pulmonary disease with (acute) exacerbation: Secondary | ICD-10-CM | POA: Diagnosis not present

## 2014-09-04 MED ORDER — IPRATROPIUM BROMIDE 0.02 % IN SOLN: 0.5000 mg | Freq: Two times a day (BID) | RESPIRATORY_TRACT | Status: DC

## 2014-09-04 MED ORDER — PREDNISONE 10 MG PO TABS: ORAL_TABLET | ORAL | Status: DC

## 2014-09-04 MED ORDER — BENZONATATE 100 MG PO CAPS: ORAL_CAPSULE | ORAL | Status: DC

## 2014-09-04 NOTE — Progress Notes (Signed)
Subjective:    Patient ID: [name], female    DOB: 01/16/1951, 63 y.o.   MRN: [id number]  HPI CAT Score 09/10/2013 11/01/2012 09/06/2012  Total CAT Score 20 24 27    09/04/2014 Chief Complaint  Patient presents with  . Follow-up    Pt c/o having some hoarseness, increased SOB, productive cough with yellow phlegm, chest congestion x 2 weeks. Pt is on zpak prescribed by her PCP, started this yesterday.    Ill for 5 days, pcp rx depomedrol and zpak.  Pt just took first doses of zpak this am.  Notes wheezing.  No chest xray done. Pt using nicorette chewing. Not currently smoking .   No real chest pain.  Notes dyspnea with exertion only.  Hard to sleep on back from breathing. Notes qhs symptoms of breathing issues.  No fever.    Review of Systems  Constitutional: Negative for chills, diaphoresis, activity change, appetite change, fatigue and unexpected weight change.  HENT: Negative for congestion, dental problem, ear discharge, facial swelling, hearing loss, mouth sores, nosebleeds, postnasal drip, sinus pressure, sneezing, tinnitus, trouble swallowing and voice change.   Eyes: Negative for photophobia, discharge, itching and visual disturbance.  Respiratory: Positive for cough and chest tightness. Negative for apnea, choking and stridor.   Cardiovascular: Negative for palpitations.  Gastrointestinal: Negative for nausea, constipation, blood in stool and abdominal distention.       Pt with GERD, pt will break through heartburn meds  Genitourinary: Negative for dysuria, urgency, frequency, hematuria, flank pain, decreased urine volume and difficulty urinating.  Musculoskeletal: Negative for myalgias, back pain, joint swelling, arthralgias, gait problem and neck stiffness.  Skin: Negative for color change and pallor.  Neurological: Negative for dizziness, tremors, seizures, syncope, speech difficulty, weakness, light-headedness and numbness.  Hematological: Negative for adenopathy. Does  not bruise/bleed easily.  Psychiatric/Behavioral: Negative for confusion, sleep disturbance and agitation. The patient is not nervous/anxious.        Objective:   Physical Exam  Filed Vitals:   09/04/14 1041  BP: 105/65  Pulse: 72  Temp: 98.1 F (36.7 C)  TempSrc: Oral  Height: 5\' 4"  (1.626 m)  Weight: 191 lb 12.8 oz (87 kg)  SpO2: 98%    Gen: Pleasant, well-nourished, in no distress,  normal affect  ENT: No lesions,  mouth clear,  oropharynx clear, no postnasal drip  Neck: No JVD, no TMG, no carotid bruits  Lungs: No use of accessory muscles, no dullness to percussion, poor airflow  Cardiovascular: RRR, heart sounds normal, no murmur or gallops, no peripheral edema  Abdomen: soft and NT, no HSM,  BS normal  Musculoskeletal: No deformities, no cyanosis or clubbing  Neuro: alert, non focal  Skin: Warm, no lesions or rashes     Assessment & Plan:   No problem-specific assessment & plan notes found for this encounter.   Updated Medication List Outpatient Encounter Prescriptions as of 09/04/2014  Medication Sig  . albuterol (PROVENTIL) (2.5 MG/3ML) 0.083% nebulizer solution Take 3 mLs (2.5 mg total) by nebulization 2 (two) times daily. And as needed For wheezing  . albuterol-ipratropium (COMBIVENT) 18-103 MCG/ACT inhaler Inhale 2 puffs into the lungs every 6 (six) hours as needed for wheezing.  amLODipine (NORVASC) 10 MG tablet Take 10 mg by mouth daily.  14/06/2014 aspirin EC 81 MG tablet Take 81 mg by mouth daily.   Marland Kitchen azithromycin (ZITHROMAX) 250 MG tablet Take 250 mg by mouth daily. As directed  . famotidine (PEPCID) 40 MG tablet Take 40  mg by mouth daily as needed.  . Fluticasone-Salmeterol (ADVAIR) 500-50 MCG/DOSE AEPB Inhale 1 puff into the lungs every 12 (twelve) hours.    Marland Kitchen HYDROCHLOROTHIAZIDE PO Take 1 tablet by mouth daily.   Marland Kitchen HYDROcodone-acetaminophen (NORCO/VICODIN) 5-325 MG per tablet Take 1-2 tablets by mouth every 4 (four) hours as needed.  Marland Kitchen ipratropium  (ATROVENT) 0.02 % nebulizer solution Take 2.5 mLs (0.5 mg total) by nebulization 2 (two) times daily. And as needed  . metFORMIN (GLUCOPHAGE) 500 MG tablet Take 500 mg by mouth 2 (two) times daily with a meal.    . naproxen (NAPROSYN) 500 MG tablet Take 1 tablet (500 mg total) by mouth 2 (two) times daily.  . Ondansetron (ZUPLENZ) 4 MG FILM Take 4 mg by mouth as needed.  . potassium chloride SA (K-DUR,KLOR-CON) 20 MEQ tablet Take 1 tablet (20 mEq total) by mouth 2 (two) times daily.  . pravastatin (PRAVACHOL) 40 MG tablet Take 40 mg by mouth at bedtime.  . [DISCONTINUED] lubiprostone (AMITIZA) 24 MCG capsule Take 24 mcg by mouth daily with breakfast.

## 2014-09-04 NOTE — Telephone Encounter (Signed)
Needs f/u ov and refill one time

## 2014-09-04 NOTE — Telephone Encounter (Signed)
Spoke with pt and advised of Dr Lynelle Doctor recommendations.  Pt states she seen her pcp and given meds but not doing well.  Scheduled ov with Dr Delford Field in [place].

## 2014-09-04 NOTE — Patient Instructions (Addendum)
Prednisone 10mg  Take 4 for three days 3 for three days 2 for three days 1 for three days and stop, sent to downstairs pharmacy Get nicotine gum or lozenges to stop smoking Finish azithromycin Refill on atrovent nebulizer sent to pharmacy [facility] No other changes in medications Return 4 months

## 2014-09-05 NOTE — Assessment & Plan Note (Signed)
Ongoing tobacco use The patient was given 3-10 minutes of smoking cessation counseling The patient was advised to begin nicotine replacement therapy using Nicorette minis

## 2014-09-05 NOTE — Assessment & Plan Note (Signed)
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

## 2014-09-09 ENCOUNTER — Encounter (HOSPITAL_BASED_OUTPATIENT_CLINIC_OR_DEPARTMENT_OTHER): Payer: Self-pay | Admitting: *Deleted

## 2014-09-09 ENCOUNTER — Emergency Department (HOSPITAL_BASED_OUTPATIENT_CLINIC_OR_DEPARTMENT_OTHER): Payer: Medicaid Other

## 2014-09-09 ENCOUNTER — Emergency Department (HOSPITAL_BASED_OUTPATIENT_CLINIC_OR_DEPARTMENT_OTHER)
Admission: EM | Admit: 2014-09-09 | Discharge: 2014-09-09 | Disposition: A | Discharge status: Home / Self Care | Payer: Medicaid Other | Attending: Emergency Medicine | Admitting: Emergency Medicine

## 2014-09-09 DIAGNOSIS — Z791 Long term (current) use of non-steroidal anti-inflammatories (NSAID): Secondary | ICD-10-CM | POA: Insufficient documentation

## 2014-09-09 DIAGNOSIS — Z7951 Long term (current) use of inhaled steroids: Secondary | ICD-10-CM | POA: Diagnosis not present

## 2014-09-09 DIAGNOSIS — Z792 Long term (current) use of antibiotics: Secondary | ICD-10-CM | POA: Diagnosis not present

## 2014-09-09 DIAGNOSIS — R011 Cardiac murmur, unspecified: Secondary | ICD-10-CM | POA: Diagnosis not present

## 2014-09-09 DIAGNOSIS — K5900 Constipation, unspecified: Secondary | ICD-10-CM | POA: Diagnosis not present

## 2014-09-09 DIAGNOSIS — E119 Type 2 diabetes mellitus without complications: Secondary | ICD-10-CM | POA: Insufficient documentation

## 2014-09-09 DIAGNOSIS — I10 Essential (primary) hypertension: Secondary | ICD-10-CM | POA: Diagnosis not present

## 2014-09-09 DIAGNOSIS — E780 Pure hypercholesterolemia: Secondary | ICD-10-CM | POA: Diagnosis not present

## 2014-09-09 DIAGNOSIS — M1990 Unspecified osteoarthritis, unspecified site: Secondary | ICD-10-CM | POA: Insufficient documentation

## 2014-09-09 DIAGNOSIS — Z7982 Long term (current) use of aspirin: Secondary | ICD-10-CM | POA: Diagnosis not present

## 2014-09-09 DIAGNOSIS — Z87440 Personal history of urinary (tract) infections: Secondary | ICD-10-CM | POA: Diagnosis not present

## 2014-09-09 DIAGNOSIS — K219 Gastro-esophageal reflux disease without esophagitis: Secondary | ICD-10-CM | POA: Diagnosis not present

## 2014-09-09 DIAGNOSIS — Z720 Tobacco use: Secondary | ICD-10-CM | POA: Diagnosis not present

## 2014-09-09 DIAGNOSIS — Z87442 Personal history of urinary calculi: Secondary | ICD-10-CM | POA: Insufficient documentation

## 2014-09-09 DIAGNOSIS — J449 Chronic obstructive pulmonary disease, unspecified: Secondary | ICD-10-CM | POA: Insufficient documentation

## 2014-09-09 DIAGNOSIS — Z79899 Other long term (current) drug therapy: Secondary | ICD-10-CM | POA: Insufficient documentation

## 2014-09-09 DIAGNOSIS — Z7952 Long term (current) use of systemic steroids: Secondary | ICD-10-CM | POA: Diagnosis not present

## 2014-09-09 MED ORDER — FLEET ENEMA 7-19 GM/118ML RE ENEM
1.0000 | ENEMA | Freq: Once | RECTAL | Status: AC
  Administered 2014-09-09: 1 via RECTAL

## 2014-09-09 MED ORDER — FLEET ENEMA 7-19 GM/118ML RE ENEM
ENEMA | RECTAL | Status: AC
  Filled 2014-09-09: qty 1

## 2014-09-09 NOTE — ED Notes (Signed)
t c/o constipation last BM x 2 weeks

## 2014-09-09 NOTE — Discharge Instructions (Signed)

## 2014-09-09 NOTE — ED Provider Notes (Signed)
CSN: [id number]     Arrival date & time 09/09/14  1857 History  This chart was scribed for Toy Cookey, MD by Murriel Hopper, ED Scribe. This patient was seen in room MH12/MH12 and the patient's care was started at 11:01 PM.    Chief Complaint  Patient presents with  . Constipation    The history is provided by the patient. No language interpreter was used.     HPI Comments: PESSY DELAMAR is a 63 y.o. female who presents to the Emergency Department complaining of constant constipation that has been present for 8 months. Pt states that she had a bowel movement in the ED during this visit, but states that she had not had a decent bowel movement prior to tonight in about a month. Pt states that she has had hemorrhoids recently and that she has been prescribed painkillers for them, which could be contributing to her constipation. Pt states that she takes OTC stool softeners and fiber supplements which have not provided her with any relief. Pt denies fevers or chills.      Past Medical History  Diagnosis Date  . Hypertension   . Diabetes mellitus   . High cholesterol   . Asthma   . COPD (chronic obstructive pulmonary disease)   . Hyperlipidemia   . Reflux   . Kidney infection   . Kidney stone   . Arthritis   . Heart murmur    Past Surgical History  Procedure Laterality Date  . Fillopian tube removal    . Right breast cyst removal     Family History  Problem Relation Age of Onset  . CVA    . Hypertension    . Diabetes Mellitus II     History  Substance Use Topics  . Smoking status: Current Every Day Smoker -- 0.25 packs/day    Types: Cigarettes  . Smokeless tobacco: Never Used     Comment: Started smoking at age 45.  Currently smoking 2-3 cig per day  . Alcohol Use: No   OB History    No data available     Review of Systems  Constitutional: Negative for fever, chills, diaphoresis, activity change, appetite change and fatigue.  HENT: Negative for congestion,  facial swelling, rhinorrhea and sore throat.   Eyes: Negative for photophobia and discharge.  Respiratory: Negative for cough, chest tightness and shortness of breath.   Cardiovascular: Negative for chest pain, palpitations and leg swelling.  Gastrointestinal: Positive for constipation.  Endocrine: Negative for polydipsia and polyuria.  Genitourinary: Negative for frequency, difficulty urinating and pelvic pain.  Musculoskeletal: Negative for arthralgias, neck pain and neck stiffness.  Skin: Negative for color change and wound.  Allergic/Immunologic: Negative for immunocompromised state.  Neurological: Negative for facial asymmetry, weakness, numbness and headaches.  Hematological: Does not bruise/bleed easily.  Psychiatric/Behavioral: Negative for confusion and agitation.      Allergies  Azithromycin; Cephalexin; Benadryl; Nexium; and Spiriva  Home Medications   Prior to Admission medications   Medication Sig Start Date End Date Taking? Authorizing Provider  albuterol (PROVENTIL) (2.5 MG/3ML) 0.083% nebulizer solution Take 3 mLs (2.5 mg total) by nebulization 2 (two) times daily. And as needed For wheezing 11/01/12   Storm Frisk, MD  albuterol-ipratropium [facility]) [phone] MCG/ACT inhaler Inhale 2 puffs into the lungs every 6 (six) hours as needed for wheezing. 02/03/12   April K Palumbo-Rasch, MD  amLODipine (NORVASC) 10 MG tablet Take 10 mg by mouth daily.    Historical Provider, MD  aspirin EC 81 MG tablet Take 81 mg by mouth daily.     Historical Provider, MD  azithromycin (ZITHROMAX) 250 MG tablet Take 250 mg by mouth daily. As directed    Historical Provider, MD  benzonatate (TESSALON) 100 MG capsule Take 1 - 2 every 6 hours as needed for cough 09/04/14   Storm Frisk, MD  famotidine (PEPCID) 40 MG tablet Take 40 mg by mouth daily as needed. 09/06/12   Storm Frisk, MD  Fluticasone-Salmeterol (ADVAIR) 500-50 MCG/DOSE AEPB Inhale 1 puff into the lungs every 12 (twelve)  hours.      Historical Provider, MD  HYDROCHLOROTHIAZIDE PO Take 1 tablet by mouth daily.     Historical Provider, MD  HYDROcodone-acetaminophen (NORCO/VICODIN) 5-325 MG per tablet Take 1-2 tablets by mouth every 4 (four) hours as needed. 02/15/14   Shari A Upstill, PA-C  ipratropium (ATROVENT) 0.02 % nebulizer solution Take 2.5 mLs (0.5 mg total) by nebulization 2 (two) times daily. And as needed 09/04/14   Storm Frisk, MD  metFORMIN (GLUCOPHAGE) 500 MG tablet Take 500 mg by mouth 2 (two) times daily with a meal.      Historical Provider, MD  naproxen (NAPROSYN) 500 MG tablet Take 1 tablet (500 mg total) by mouth 2 (two) times daily. 02/15/14   Shari A Upstill, PA-C  Ondansetron (ZUPLENZ) 4 MG FILM Take 4 mg by mouth as needed.    Historical Provider, MD  potassium chloride SA (K-DUR,KLOR-CON) 20 MEQ tablet Take 1 tablet (20 mEq total) by mouth 2 (two) times daily. 04/26/13   Dione Booze, MD  pravastatin (PRAVACHOL) 40 MG tablet Take 40 mg by mouth at bedtime.    Historical Provider, MD  predniSONE (DELTASONE) 10 MG tablet Take 4 for three days 3 for three days 2 for three days 1 for three days and stop 09/04/14   Storm Frisk, MD   BP 126/73 mmHg  Pulse 53  Temp(Src) 98.6 F (37 C) (Oral)  Resp 20  Ht 5\' 4"  (1.626 m)  Wt 191 lb (86.637 kg)  BMI 32.77 kg/m2  SpO2 99% Physical Exam  Constitutional: She is oriented to person, place, and time. She appears well-developed and well-nourished. No distress.  HENT:  Head: Normocephalic.  Mouth/Throat: Oropharynx is clear and moist.  Eyes: Pupils are equal, round, and reactive to light.  Neck: Neck supple.  Cardiovascular: Normal rate, regular rhythm and normal heart sounds.   Pulmonary/Chest: Effort normal and breath sounds normal. No respiratory distress. She has no wheezes.  Abdominal: Soft. She exhibits no distension. There is no tenderness. There is no rebound and no guarding.  Musculoskeletal: She exhibits no edema or tenderness.   Neurological: She is alert and oriented to person, place, and time.  Skin: Skin is warm and dry.  Psychiatric: She has a normal mood and affect.  Nursing note and vitals reviewed.   ED Course  Procedures (including critical care time)  DIAGNOSTIC STUDIES: Oxygen Saturation is 99% on RA, normal by my interpretation.    COORDINATION OF CARE: 11:05 PM Discussed treatment plan with pt at bedside and pt agreed to plan.   Labs Review Labs Reviewed - No data to display  Imaging Review Dg Abd 1 View  09/09/2014   CLINICAL DATA:  Three-week history of constipation and lower abdominal and lower back pain.  EXAM: ABDOMEN - 1 VIEW  COMPARISON:  Abdominal radiograph 07/06/2012.  FINDINGS: Gas and stool are seen scattered throughout the colon extending to the level  of the distal rectum. No pathologic distension of small bowel is noted. No gross evidence of pneumoperitoneum. Moderate stool burden throughout the colon.  IMPRESSION: 1.  Nonobstructive bowel gas pattern. 2. No pneumoperitoneum. 3. Moderate stool burden compatible with patient's reported history of constipation.   Electronically Signed   By: Trudie Reed M.D.   On: 09/09/2014 20:19     EKG Interpretation None      MDM   Final diagnoses:  Constipated    Pt is a 63 y.o. female with Pmhx as above who presents with about 1 month sof constipation, passing only small amts of hard stool. AAS w/ moderate stool burden. Pt given enema in ED prior to my exam and afterwards had large BM. At time of my exam, pt feeling much improved, nml abdominal exam. Will rec at least 1 week outpt tx with Miralax and outpt PCP f/u. I have recommended to her that if she continues to have constipation, should may require GI referral.      Ronnette Hila Paiz evaluation in the Emergency Department is complete. It has been determined that no acute conditions requiring further emergency intervention are present at this time. The patient/guardian have been  advised of the diagnosis and plan. We have discussed signs and symptoms that warrant return to the ED, such as changes or worsening in symptoms, worsening pain, fever, inability to tolerate PO.     I personally performed the services described in this documentation, which was scribed in my presence. The recorded information has been reviewed and is accurate.     Toy Cookey, MD 09/11/14 [phone]

## 2014-09-11 DIAGNOSIS — R928 Other abnormal and inconclusive findings on diagnostic imaging of breast: Secondary | ICD-10-CM | POA: Diagnosis not present

## 2014-12-30 ENCOUNTER — Telehealth: Payer: Self-pay | Admitting: Critical Care Medicine

## 2014-12-30 DIAGNOSIS — H25013 Cortical age-related cataract, bilateral: Secondary | ICD-10-CM | POA: Diagnosis not present

## 2014-12-30 DIAGNOSIS — E11319 Type 2 diabetes mellitus with unspecified diabetic retinopathy without macular edema: Secondary | ICD-10-CM | POA: Diagnosis not present

## 2014-12-30 DIAGNOSIS — H40011 Open angle with borderline findings, low risk, right eye: Secondary | ICD-10-CM | POA: Diagnosis not present

## 2014-12-30 DIAGNOSIS — H2513 Age-related nuclear cataract, bilateral: Secondary | ICD-10-CM | POA: Diagnosis not present

## 2014-12-30 NOTE — Telephone Encounter (Signed)
Work in with [employer]

## 2014-12-30 NOTE — Telephone Encounter (Signed)
Spoke with patient, pt c/o upper back since taking Symbicort. Pt concerned that Symbicort is causing back pain and increased SOB.  Pt stopped taking x  1 week ago. Pt states that she is feeling better since stopping but the pollen is causing increased breathing trouble.  Pt requesting sooner OV than 01/22/15 and she is wanting to have a CXR then as well - please advise if Symbicort could be causing her increased issues. Thanks.

## 2014-12-30 NOTE — Telephone Encounter (Signed)
lmtcb X1 for pt  

## 2014-12-30 NOTE — Telephone Encounter (Signed)
lmtcb X1 to schedule acute visit with TP next available.

## 2015-01-01 NOTE — Telephone Encounter (Signed)
Spoke with pt and given appt with [name] on 01/02/15 at 2:30

## 2015-01-02 ENCOUNTER — Ambulatory Visit: Payer: Medicare Other | Admitting: Adult Health

## 2015-01-08 ENCOUNTER — Telehealth: Payer: Self-pay | Admitting: Critical Care Medicine

## 2015-01-08 NOTE — Telephone Encounter (Signed)
Spoke with pt, needs to reschedule 4/8 appt. Pt has been scheduled for 4/28 with PW in [place] but was wanting a sooner appt with PW in [place].  I advised pt that this was the next date PW was in the [place] office.  Nothing further needed.

## 2015-01-08 NOTE — Telephone Encounter (Signed)
lmtcb x1 for pt. 

## 2015-01-08 NOTE — Telephone Encounter (Signed)
Pt cb, please cb at previous number listed °

## 2015-01-22 ENCOUNTER — Telehealth: Payer: Self-pay | Admitting: Critical Care Medicine

## 2015-01-22 ENCOUNTER — Ambulatory Visit (INDEPENDENT_AMBULATORY_CARE_PROVIDER_SITE_OTHER): Payer: Medicare Other | Admitting: Critical Care Medicine

## 2015-01-22 ENCOUNTER — Encounter: Payer: Self-pay | Admitting: Critical Care Medicine

## 2015-01-22 VITALS — BP 126/70 | HR 58 | Temp 97.9°F | Ht 66.0 in | Wt 195.0 lb

## 2015-01-22 DIAGNOSIS — F17210 Nicotine dependence, cigarettes, uncomplicated: Secondary | ICD-10-CM

## 2015-01-22 DIAGNOSIS — J449 Chronic obstructive pulmonary disease, unspecified: Secondary | ICD-10-CM

## 2015-01-22 DIAGNOSIS — Z23 Encounter for immunization: Secondary | ICD-10-CM | POA: Diagnosis not present

## 2015-01-22 DIAGNOSIS — Z720 Tobacco use: Secondary | ICD-10-CM

## 2015-01-22 MED ORDER — FLUTICASONE FUROATE-VILANTEROL 100-25 MCG/INH IN AEPB: 1.0000 | INHALATION_SPRAY | Freq: Every day | RESPIRATORY_TRACT | Status: DC

## 2015-01-22 MED ORDER — LOSARTAN POTASSIUM-HCTZ 100-25 MG PO TABS: 1.0000 | ORAL_TABLET | Freq: Every day | ORAL | Status: DC

## 2015-01-22 NOTE — Patient Instructions (Signed)
Stop lisinopril/hctz Start losartan 100/25 one daily Stop advair Start Breo one puff daily Stay on albuterol and atrovent in nebulizer twice daily A prevnar 13 vaccine was given Return 2 months [place] to see Dr Vassie Loll

## 2015-01-22 NOTE — Assessment & Plan Note (Signed)
Ongoing tobacco use Plan Greater than 10 minutes smoking cessation counseling was issued to the patient

## 2015-01-22 NOTE — Telephone Encounter (Signed)
Called and spoke to pt. Pt questioned what BP med she was to stop and what to start. Informed her per PW's OV note (stop lisinopril/hctz and start losartan/hctz). Pt verbalized understanding and denied any further questions or concerns at this time.

## 2015-01-22 NOTE — Progress Notes (Signed)
Subjective:    Patient ID: [name], female    DOB: [birth year], 64 y.o.   MRN: [id number]  HPI CAT Score 01/22/2015 09/04/2014 09/10/2013 11/01/2012  Total CAT Score 25 32 20 24   09/04/2014 Chief Complaint  Patient presents with  . Follow-up    Pt c/o having some hoarseness, increased SOB, productive cough with yellow phlegm, chest congestion x 2 weeks. Pt is on zpak prescribed by her PCP, started this yesterday.    Ill for 5 days, pcp rx depomedrol and zpak.  Pt just took first doses of zpak this am.  Notes wheezing.  No chest xray done. Pt using nicorette chewing. Not currently smoking .   No real chest pain.  Notes dyspnea with exertion only.  Hard to sleep on back from breathing. Notes qhs symptoms of breathing issues.  No fever.   01/22/2015 Chief Complaint  Patient presents with  . 4 month follow up    SOB, wheezing, chest tightness, and slight cough x 3-4 wks.  Was seen at PCP office - Spiriva started.  Stopped med d/t upper back pain after starting medication.  Cxr also done during this timefram at PCP office. Feels SOB has improved, wheezing still present, but doesn't feel albuterol nebs help as much recently.  No chest tightness/CP or cough now.    As per CC above   Review of Systems  Constitutional: Negative for chills, diaphoresis, activity change, appetite change, fatigue and unexpected weight change.  HENT: Negative for congestion, dental problem, ear discharge, facial swelling, hearing loss, mouth sores, nosebleeds, postnasal drip, sinus pressure, sneezing, tinnitus, trouble swallowing and voice change.   Eyes: Negative for photophobia, discharge, itching and visual disturbance.  Respiratory: Positive for cough and chest tightness. Negative for apnea, choking and stridor.   Cardiovascular: Negative for palpitations.  Gastrointestinal: Negative for nausea, constipation, blood in stool and abdominal distention.       Pt with GERD, pt will break through heartburn meds   Genitourinary: Negative for dysuria, urgency, frequency, hematuria, flank pain, decreased urine volume and difficulty urinating.  Musculoskeletal: Negative for myalgias, back pain, joint swelling, arthralgias, gait problem and neck stiffness.  Skin: Negative for color change and pallor.  Neurological: Negative for dizziness, tremors, seizures, syncope, speech difficulty, weakness, light-headedness and numbness.  Hematological: Negative for adenopathy. Does not bruise/bleed easily.  Psychiatric/Behavioral: Negative for confusion, sleep disturbance and agitation. The patient is not nervous/anxious.        Objective:   Physical Exam  Filed Vitals:   01/22/15 1034  BP: 126/70  Pulse: 58  Temp: 97.9 F (36.6 C)  TempSrc: Oral  Height: 5\' 6"  (1.676 m)  Weight: 195 lb (88.451 kg)  SpO2: 99%    Gen: Pleasant, well-nourished, in no distress,  normal affect  ENT: No lesions,  mouth clear,  oropharynx clear, no postnasal drip  Neck: No JVD, no TMG, no carotid bruits  Lungs: No use of accessory muscles, no dullness to percussion, poor airflow  Cardiovascular: RRR, heart sounds normal, no murmur or gallops, no peripheral edema  Abdomen: soft and NT, no HSM,  BS normal  Musculoskeletal: No deformities, no cyanosis or clubbing  Neuro: alert, non focal  Skin: Warm, no lesions or rashes     Assessment & Plan:   No problem-specific assessment & plan notes found for this encounter.   Updated Medication List Outpatient Encounter Prescriptions as of 01/22/2015  Medication Sig  . albuterol (PROVENTIL) (2.5 MG/3ML) 0.083% nebulizer solution Take 3 mLs (  2.5 mg total) by nebulization 2 (two) times daily. And as needed For wheezing  . amLODipine (NORVASC) 10 MG tablet Take 10 mg by mouth daily.  Marland Kitchen aspirin EC 81 MG tablet Take 81 mg by mouth daily.   . benazepril-hydrochlorthiazide (LOTENSIN HCT) 20-25 MG per tablet Take 1 tablet by mouth daily.  . benzonatate (TESSALON) 100 MG capsule  Take 1 - 2 every 6 hours as needed for cough  . Fluticasone-Salmeterol (ADVAIR) 500-50 MCG/DOSE AEPB Inhale 1 puff into the lungs every 12 (twelve) hours.    Marland Kitchen HYDROcodone-acetaminophen (NORCO/VICODIN) 5-325 MG per tablet Take 1-2 tablets by mouth every 4 (four) hours as needed.  Marland Kitchen ipratropium (ATROVENT) 0.02 % nebulizer solution Take 2.5 mLs (0.5 mg total) by nebulization 2 (two) times daily. And as needed  . metFORMIN (GLUCOPHAGE) 500 MG tablet Take 500 mg by mouth 2 (two) times daily with a meal.    . pantoprazole (PROTONIX) 40 MG tablet Take 1 tablet by mouth daily.  . potassium chloride SA (K-DUR,KLOR-CON) 20 MEQ tablet Take 1 tablet (20 mEq total) by mouth 2 (two) times daily.  . pravastatin (PRAVACHOL) 40 MG tablet Take 40 mg by mouth at bedtime.  Marland Kitchen PROAIR HFA 108 (90 BASE) MCG/ACT inhaler Inhale 2 puffs into the lungs every 6 (six) hours as needed.  Marland Kitchen albuterol-ipratropium (COMBIVENT) 18-103 MCG/ACT inhaler Inhale 2 puffs into the lungs every 6 (six) hours as needed for wheezing. (Patient not taking: Reported on 01/22/2015)  . [DISCONTINUED] azithromycin (ZITHROMAX) 250 MG tablet Take 250 mg by mouth daily. As directed  . [DISCONTINUED] famotidine (PEPCID) 40 MG tablet Take 40 mg by mouth daily as needed.  . [DISCONTINUED] HYDROCHLOROTHIAZIDE PO Take 1 tablet by mouth daily.   . [DISCONTINUED] naproxen (NAPROSYN) 500 MG tablet Take 1 tablet (500 mg total) by mouth 2 (two) times daily. (Patient not taking: Reported on 01/22/2015)  . [DISCONTINUED] Ondansetron (ZUPLENZ) 4 MG FILM Take 4 mg by mouth as needed.  . [DISCONTINUED] predniSONE (DELTASONE) 10 MG tablet Take 4 for three days 3 for three days 2 for three days 1 for three days and stop (Patient not taking: Reported on 01/22/2015)

## 2015-01-22 NOTE — Assessment & Plan Note (Signed)
COPD gold stage B with asthmatic bronchitic component and continued smoking use I spent greater than 10 minutes with smoking cessation counseling on this patient at this visit and the patient was advised initiate nicotine replacement therapy Also there is upper airway instability being precipitated by lisinopril as an ACE inhibitor Plan Stop lisinopril/hctz Start losartan 100/25 one daily Stop advair Start Breo one puff daily Stay on albuterol and atrovent in nebulizer twice daily A prevnar 13 vaccine was given Return 2 months [place] to see Dr Vassie Loll

## 2015-01-31 ENCOUNTER — Telehealth: Payer: Self-pay | Admitting: Internal Medicine

## 2015-01-31 NOTE — Telephone Encounter (Signed)
Confused with meds:  doesn't think she's on lisinopril/ hasn't started losartan  rec ov with Tammy NP with all meds in hand asap to regroup but stay on whatever she's on right now as sounds good over the phone

## 2015-02-02 NOTE — Telephone Encounter (Signed)
Spoke with pt. ROV has been made for 02/06/15 at 3pm. Nothing further was needed.

## 2015-02-06 ENCOUNTER — Encounter: Payer: Medicare Other | Admitting: Adult Health

## 2015-03-03 DIAGNOSIS — Z1211 Encounter for screening for malignant neoplasm of colon: Secondary | ICD-10-CM | POA: Diagnosis not present

## 2015-03-04 DIAGNOSIS — H2511 Age-related nuclear cataract, right eye: Secondary | ICD-10-CM | POA: Diagnosis not present

## 2015-03-04 DIAGNOSIS — H18412 Arcus senilis, left eye: Secondary | ICD-10-CM | POA: Diagnosis not present

## 2015-03-04 DIAGNOSIS — H25011 Cortical age-related cataract, right eye: Secondary | ICD-10-CM | POA: Diagnosis not present

## 2015-03-04 DIAGNOSIS — H18411 Arcus senilis, right eye: Secondary | ICD-10-CM | POA: Diagnosis not present

## 2015-03-04 DIAGNOSIS — H2512 Age-related nuclear cataract, left eye: Secondary | ICD-10-CM | POA: Diagnosis not present

## 2015-03-04 DIAGNOSIS — H25012 Cortical age-related cataract, left eye: Secondary | ICD-10-CM | POA: Diagnosis not present

## 2015-03-19 ENCOUNTER — Ambulatory Visit: Payer: Medicare Other | Admitting: Adult Health

## 2015-03-24 DIAGNOSIS — I10 Essential (primary) hypertension: Secondary | ICD-10-CM | POA: Diagnosis not present

## 2015-03-24 DIAGNOSIS — Z888 Allergy status to other drugs, medicaments and biological substances status: Secondary | ICD-10-CM | POA: Diagnosis not present

## 2015-03-24 DIAGNOSIS — E785 Hyperlipidemia, unspecified: Secondary | ICD-10-CM | POA: Diagnosis not present

## 2015-03-24 DIAGNOSIS — Z79899 Other long term (current) drug therapy: Secondary | ICD-10-CM | POA: Diagnosis not present

## 2015-03-24 DIAGNOSIS — F17200 Nicotine dependence, unspecified, uncomplicated: Secondary | ICD-10-CM | POA: Diagnosis not present

## 2015-03-24 DIAGNOSIS — J45909 Unspecified asthma, uncomplicated: Secondary | ICD-10-CM | POA: Diagnosis not present

## 2015-03-24 DIAGNOSIS — Z7951 Long term (current) use of inhaled steroids: Secondary | ICD-10-CM | POA: Diagnosis not present

## 2015-03-24 DIAGNOSIS — H2512 Age-related nuclear cataract, left eye: Secondary | ICD-10-CM | POA: Diagnosis not present

## 2015-03-24 DIAGNOSIS — J449 Chronic obstructive pulmonary disease, unspecified: Secondary | ICD-10-CM | POA: Diagnosis not present

## 2015-03-24 DIAGNOSIS — K219 Gastro-esophageal reflux disease without esophagitis: Secondary | ICD-10-CM | POA: Diagnosis not present

## 2015-03-24 DIAGNOSIS — Z881 Allergy status to other antibiotic agents status: Secondary | ICD-10-CM | POA: Diagnosis not present

## 2015-03-24 DIAGNOSIS — E119 Type 2 diabetes mellitus without complications: Secondary | ICD-10-CM | POA: Diagnosis not present

## 2015-03-24 DIAGNOSIS — M1711 Unilateral primary osteoarthritis, right knee: Secondary | ICD-10-CM | POA: Diagnosis not present

## 2015-03-24 DIAGNOSIS — H269 Unspecified cataract: Secondary | ICD-10-CM | POA: Diagnosis not present

## 2015-03-24 DIAGNOSIS — Z7982 Long term (current) use of aspirin: Secondary | ICD-10-CM | POA: Diagnosis not present

## 2015-03-25 DIAGNOSIS — H2511 Age-related nuclear cataract, right eye: Secondary | ICD-10-CM | POA: Diagnosis not present

## 2015-04-09 ENCOUNTER — Ambulatory Visit: Payer: Medicare Other | Admitting: Adult Health

## 2015-04-09 DIAGNOSIS — Z1211 Encounter for screening for malignant neoplasm of colon: Secondary | ICD-10-CM | POA: Diagnosis not present

## 2015-04-14 DIAGNOSIS — I10 Essential (primary) hypertension: Secondary | ICD-10-CM | POA: Diagnosis not present

## 2015-04-14 DIAGNOSIS — Z881 Allergy status to other antibiotic agents status: Secondary | ICD-10-CM | POA: Diagnosis not present

## 2015-04-14 DIAGNOSIS — Z7982 Long term (current) use of aspirin: Secondary | ICD-10-CM | POA: Diagnosis not present

## 2015-04-14 DIAGNOSIS — Z888 Allergy status to other drugs, medicaments and biological substances status: Secondary | ICD-10-CM | POA: Diagnosis not present

## 2015-04-14 DIAGNOSIS — Z79899 Other long term (current) drug therapy: Secondary | ICD-10-CM | POA: Diagnosis not present

## 2015-04-14 DIAGNOSIS — E785 Hyperlipidemia, unspecified: Secondary | ICD-10-CM | POA: Diagnosis not present

## 2015-04-14 DIAGNOSIS — E119 Type 2 diabetes mellitus without complications: Secondary | ICD-10-CM | POA: Diagnosis not present

## 2015-04-14 DIAGNOSIS — H2511 Age-related nuclear cataract, right eye: Secondary | ICD-10-CM | POA: Diagnosis not present

## 2015-04-14 DIAGNOSIS — K219 Gastro-esophageal reflux disease without esophagitis: Secondary | ICD-10-CM | POA: Diagnosis not present

## 2015-04-14 DIAGNOSIS — F17200 Nicotine dependence, unspecified, uncomplicated: Secondary | ICD-10-CM | POA: Diagnosis not present

## 2015-04-14 DIAGNOSIS — Z7951 Long term (current) use of inhaled steroids: Secondary | ICD-10-CM | POA: Diagnosis not present

## 2015-04-14 DIAGNOSIS — J45909 Unspecified asthma, uncomplicated: Secondary | ICD-10-CM | POA: Diagnosis not present

## 2015-04-14 DIAGNOSIS — J449 Chronic obstructive pulmonary disease, unspecified: Secondary | ICD-10-CM | POA: Diagnosis not present

## 2015-04-14 DIAGNOSIS — E780 Pure hypercholesterolemia: Secondary | ICD-10-CM | POA: Diagnosis not present

## 2015-04-20 DIAGNOSIS — M5032 Other cervical disc degeneration, mid-cervical region: Secondary | ICD-10-CM | POA: Diagnosis not present

## 2015-04-20 DIAGNOSIS — M791 Myalgia: Secondary | ICD-10-CM | POA: Diagnosis not present

## 2015-04-20 DIAGNOSIS — M545 Low back pain: Secondary | ICD-10-CM | POA: Diagnosis not present

## 2015-04-20 DIAGNOSIS — M542 Cervicalgia: Secondary | ICD-10-CM | POA: Diagnosis not present

## 2015-04-20 DIAGNOSIS — M25561 Pain in right knee: Secondary | ICD-10-CM | POA: Diagnosis not present

## 2015-04-20 DIAGNOSIS — M5412 Radiculopathy, cervical region: Secondary | ICD-10-CM | POA: Diagnosis not present

## 2015-04-20 DIAGNOSIS — M25562 Pain in left knee: Secondary | ICD-10-CM | POA: Diagnosis not present

## 2015-04-29 DIAGNOSIS — M9905 Segmental and somatic dysfunction of pelvic region: Secondary | ICD-10-CM | POA: Diagnosis not present

## 2015-04-29 DIAGNOSIS — M5412 Radiculopathy, cervical region: Secondary | ICD-10-CM | POA: Diagnosis not present

## 2015-04-29 DIAGNOSIS — M5382 Other specified dorsopathies, cervical region: Secondary | ICD-10-CM | POA: Diagnosis not present

## 2015-04-29 DIAGNOSIS — M4602 Spinal enthesopathy, cervical region: Secondary | ICD-10-CM | POA: Diagnosis not present

## 2015-04-29 DIAGNOSIS — M9901 Segmental and somatic dysfunction of cervical region: Secondary | ICD-10-CM | POA: Diagnosis not present

## 2015-04-30 ENCOUNTER — Ambulatory Visit: Payer: Medicare Other | Admitting: Adult Health

## 2015-05-13 DIAGNOSIS — K648 Other hemorrhoids: Secondary | ICD-10-CM | POA: Diagnosis not present

## 2015-05-13 DIAGNOSIS — Z9889 Other specified postprocedural states: Secondary | ICD-10-CM | POA: Diagnosis not present

## 2015-05-13 DIAGNOSIS — N200 Calculus of kidney: Secondary | ICD-10-CM | POA: Diagnosis not present

## 2015-05-13 DIAGNOSIS — K5900 Constipation, unspecified: Secondary | ICD-10-CM | POA: Diagnosis not present

## 2015-05-13 DIAGNOSIS — K921 Melena: Secondary | ICD-10-CM | POA: Diagnosis not present

## 2015-05-14 ENCOUNTER — Ambulatory Visit (INDEPENDENT_AMBULATORY_CARE_PROVIDER_SITE_OTHER): Payer: Medicare Other | Admitting: Adult Health

## 2015-05-14 ENCOUNTER — Encounter: Payer: Self-pay | Admitting: Adult Health

## 2015-05-14 VITALS — BP 96/62 | HR 64 | Ht 66.0 in | Wt 185.0 lb

## 2015-05-14 DIAGNOSIS — J441 Chronic obstructive pulmonary disease with (acute) exacerbation: Secondary | ICD-10-CM

## 2015-05-14 MED ORDER — BENZONATATE 200 MG PO CAPS: 200.0000 mg | ORAL_CAPSULE | Freq: Three times a day (TID) | ORAL | Status: DC | PRN

## 2015-05-14 MED ORDER — DOXYCYCLINE HYCLATE 100 MG PO TABS: 100.0000 mg | ORAL_TABLET | Freq: Two times a day (BID) | ORAL | Status: DC

## 2015-05-14 NOTE — Progress Notes (Signed)
   Subjective:    Patient ID: LAQUASIA PINCUS, female    DOB: 03-09-1951, 64 y.o.   MRN: [id number]  HPI 64 yo female with COPD , smoker   05/14/2015 Follow up : COPD  Returns for 4 month follow up .  Complains of Coughing up yellow mucus; chest tightness, having to use rescue inhaler more often. Wheezing on and off for last 3 months .  Remains on Advair. Was changed to [facility] last ov but did not like it.  ACE was stopped last ov d/t to recurrent cough.  Still smoking , discussed smoking cessation .  No fever, chest pain, orthopnea, edema.       Review of Systems Constitutional:   No  weight loss, night sweats,  Fevers, chills,  +fatigue, or  lassitude.  HEENT:   No headaches,  Difficulty swallowing,  Tooth/dental problems, or  Sore throat,                No sneezing, itching, ear ache,  +nasal congestion, post nasal drip,   CV:  No chest pain,  Orthopnea, PND, swelling in lower extremities, anasarca, dizziness, palpitations, syncope.   GI  No heartburn, indigestion, abdominal pain, nausea, vomiting, diarrhea, change in bowel habits, loss of appetite, bloody stools.   Resp:    No chest wall deformity  Skin: no rash or lesions.  GU: no dysuria, change in color of urine, no urgency or frequency.  No flank pain, no hematuria   MS:  No joint pain or swelling.  No decreased range of motion.  No back pain.  Psych:  No change in mood or affect. No depression or anxiety.  No memory loss.         Objective:   Physical Exam  GEN: A/Ox3; pleasant , NAD   HEENT:  [place]/AT,  EACs-clear, TMs-wnl, NOSE-clear, THROAT-clear, no lesions, no postnasal drip or exudate noted.   NECK:  Supple w/ fair ROM; no JVD; normal carotid impulses w/o bruits; no thyromegaly or nodules palpated; no lymphadenopathy.  RESP  Trace exp wheezing, speaks in full sentences   No accessory muscle use, no dullness to percussion  CARD:  RRR, no m/r/g  , no peripheral edema, pulses intact, no cyanosis or  clubbing.  GI:   Soft & nt; nml bowel sounds; no organomegaly or masses detected.  Musco: Warm bil, no deformities or joint swelling noted.   Neuro: alert, no focal deficits noted.    Skin: Warm, no lesions or rashes        Assessment & Plan:

## 2015-05-14 NOTE — Assessment & Plan Note (Signed)
Exacerbation   Plan  Doxycycline 100mg  Twice daily  For 7 days , take with food.  Wear sunscreen as may cause you to sunburn while taking .  Mucinex DM Twice daily  As needed  Cough/congestion  Tessalon Three times a day  As needed  Cough  Please contact office for sooner follow up if symptoms do not improve or worsen or seek emergency care  follow up Dr.  In 3 months  Work on not smoking .

## 2015-05-14 NOTE — Patient Instructions (Addendum)
Doxycycline 100mg  Twice daily  For 7 days , take with food.  Wear sunscreen as may cause you to sunburn while taking .  Mucinex DM Twice daily  As needed  Cough/congestion  Tessalon Three times a day  As needed  Cough  Please contact office for sooner follow up if symptoms do not improve or worsen or seek emergency care  follow up Dr.  In 3 months  Work on not smoking .

## 2015-05-18 NOTE — Progress Notes (Signed)
Reviewed & agree with plan  

## 2015-05-19 DIAGNOSIS — R928 Other abnormal and inconclusive findings on diagnostic imaging of breast: Secondary | ICD-10-CM | POA: Diagnosis not present

## 2015-05-19 DIAGNOSIS — N63 Unspecified lump in breast: Secondary | ICD-10-CM | POA: Diagnosis not present

## 2015-06-15 DIAGNOSIS — M545 Low back pain: Secondary | ICD-10-CM | POA: Diagnosis not present

## 2015-06-15 DIAGNOSIS — S39012A Strain of muscle, fascia and tendon of lower back, initial encounter: Secondary | ICD-10-CM | POA: Diagnosis not present

## 2015-07-07 ENCOUNTER — Emergency Department (HOSPITAL_BASED_OUTPATIENT_CLINIC_OR_DEPARTMENT_OTHER): Payer: Medicare Other

## 2015-07-07 ENCOUNTER — Encounter (HOSPITAL_BASED_OUTPATIENT_CLINIC_OR_DEPARTMENT_OTHER): Payer: Self-pay | Admitting: *Deleted

## 2015-07-07 ENCOUNTER — Emergency Department (HOSPITAL_BASED_OUTPATIENT_CLINIC_OR_DEPARTMENT_OTHER)
Admission: EM | Admit: 2015-07-07 | Discharge: 2015-07-07 | Disposition: A | Discharge status: Home / Self Care | Payer: Medicare Other | Attending: Emergency Medicine | Admitting: Emergency Medicine

## 2015-07-07 DIAGNOSIS — J441 Chronic obstructive pulmonary disease with (acute) exacerbation: Secondary | ICD-10-CM | POA: Diagnosis not present

## 2015-07-07 DIAGNOSIS — Z87440 Personal history of urinary (tract) infections: Secondary | ICD-10-CM | POA: Insufficient documentation

## 2015-07-07 DIAGNOSIS — E119 Type 2 diabetes mellitus without complications: Secondary | ICD-10-CM | POA: Insufficient documentation

## 2015-07-07 DIAGNOSIS — E7800 Pure hypercholesterolemia, unspecified: Secondary | ICD-10-CM | POA: Insufficient documentation

## 2015-07-07 DIAGNOSIS — M1990 Unspecified osteoarthritis, unspecified site: Secondary | ICD-10-CM | POA: Insufficient documentation

## 2015-07-07 DIAGNOSIS — J209 Acute bronchitis, unspecified: Secondary | ICD-10-CM | POA: Diagnosis not present

## 2015-07-07 DIAGNOSIS — E785 Hyperlipidemia, unspecified: Secondary | ICD-10-CM | POA: Diagnosis not present

## 2015-07-07 DIAGNOSIS — Z87448 Personal history of other diseases of urinary system: Secondary | ICD-10-CM | POA: Insufficient documentation

## 2015-07-07 DIAGNOSIS — Z79899 Other long term (current) drug therapy: Secondary | ICD-10-CM | POA: Diagnosis not present

## 2015-07-07 DIAGNOSIS — Z720 Tobacco use: Secondary | ICD-10-CM | POA: Diagnosis not present

## 2015-07-07 DIAGNOSIS — Z792 Long term (current) use of antibiotics: Secondary | ICD-10-CM | POA: Insufficient documentation

## 2015-07-07 DIAGNOSIS — I10 Essential (primary) hypertension: Secondary | ICD-10-CM | POA: Diagnosis not present

## 2015-07-07 DIAGNOSIS — R011 Cardiac murmur, unspecified: Secondary | ICD-10-CM | POA: Diagnosis not present

## 2015-07-07 DIAGNOSIS — Z7982 Long term (current) use of aspirin: Secondary | ICD-10-CM | POA: Diagnosis not present

## 2015-07-07 DIAGNOSIS — Z7984 Long term (current) use of oral hypoglycemic drugs: Secondary | ICD-10-CM | POA: Diagnosis not present

## 2015-07-07 DIAGNOSIS — R05 Cough: Secondary | ICD-10-CM | POA: Diagnosis present

## 2015-07-07 DIAGNOSIS — K219 Gastro-esophageal reflux disease without esophagitis: Secondary | ICD-10-CM | POA: Diagnosis not present

## 2015-07-07 DIAGNOSIS — F17210 Nicotine dependence, cigarettes, uncomplicated: Secondary | ICD-10-CM | POA: Diagnosis not present

## 2015-07-07 MED ORDER — MOXIFLOXACIN HCL 400 MG PO TABS: 400.0000 mg | ORAL_TABLET | Freq: Every day | ORAL | Status: DC

## 2015-07-07 MED ORDER — PREDNISONE 10 MG PO TABS: 20.0000 mg | ORAL_TABLET | Freq: Two times a day (BID) | ORAL | Status: DC

## 2015-07-07 MED ORDER — ALBUTEROL SULFATE (2.5 MG/3ML) 0.083% IN NEBU
5.0000 mg | INHALATION_SOLUTION | Freq: Once | RESPIRATORY_TRACT | Status: AC
  Administered 2015-07-07: 5 mg via RESPIRATORY_TRACT
  Filled 2015-07-07: qty 6

## 2015-07-07 MED ORDER — IPRATROPIUM BROMIDE 0.02 % IN SOLN
0.5000 mg | Freq: Once | RESPIRATORY_TRACT | Status: AC
  Administered 2015-07-07: 0.5 mg via RESPIRATORY_TRACT
  Filled 2015-07-07: qty 2.5

## 2015-07-07 NOTE — ED Notes (Signed)
Cough x 2 weeks. Wheezing. Worse at night.

## 2015-07-07 NOTE — ED Provider Notes (Signed)
CSN: [id number]     Arrival date & time 07/07/15  1621 History   First MD Initiated Contact with Patient 07/07/15 1720     Chief Complaint  Patient presents with  . Cough     (Consider location/radiation/quality/duration/timing/severity/associated sxs/prior Treatment) HPI Comments: Patient is a 64 year old female with history of COPD. She presents for evaluation of shortness of breath which has been worsening over the past 2 weeks. She was seen by her primary doctor and started on doxycycline, however this has not helped. She continues to wheeze and cough up yellow sputum. She denies any fevers or chills. She denies any chest pain. She continues to smoke.  Patient is a 64 y.o. female presenting with cough. The history is provided by the patient.  Cough Cough characteristics:  Productive Sputum characteristics:  Yellow Severity:  Moderate Onset quality:  Gradual Duration:  2 weeks Timing:  Constant Progression:  Worsening Chronicity:  New Smoker: yes   Relieved by:  Nothing Worsened by:  Nothing tried Ineffective treatments:  Beta-agonist inhaler Associated symptoms: no chest pain and no fever     Past Medical History  Diagnosis Date  . Hypertension   . Diabetes mellitus   . High cholesterol   . Asthma   . COPD (chronic obstructive pulmonary disease) (HCC)   . Hyperlipidemia   . Reflux   . Kidney infection   . Kidney stone   . Arthritis   . Heart murmur    Past Surgical History  Procedure Laterality Date  . Fillopian tube removal    . Right breast cyst removal     Family History  Problem Relation Age of Onset  . CVA    . Hypertension    . Diabetes Mellitus II     Social History  Substance Use Topics  . Smoking status: Current Every Day Smoker -- 0.25 packs/day    Types: Cigarettes  . Smokeless tobacco: Never Used     Comment: Started smoking at age 76.  Currently smoking 2-3 cig per day  . Alcohol Use: No   OB History    No data available     Review  of Systems  Constitutional: Negative for fever.  Respiratory: Positive for cough.   Cardiovascular: Negative for chest pain.  All other systems reviewed and are negative.     Allergies  Azithromycin; Benazepril; Cephalexin; Benadryl; Nexium; and Spiriva  Home Medications   Prior to Admission medications   Medication Sig Start Date End Date Taking? Authorizing Provider  albuterol (PROVENTIL) (2.5 MG/3ML) 0.083% nebulizer solution Take 3 mLs (2.5 mg total) by nebulization 2 (two) times daily. And as needed For wheezing 11/01/12   Storm Frisk, MD  albuterol-ipratropium [facility]) [phone] MCG/ACT inhaler Inhale 2 puffs into the lungs every 6 (six) hours as needed for wheezing. 02/03/12   April Palumbo, MD  amLODipine (NORVASC) 10 MG tablet Take 10 mg by mouth daily.    Historical Provider, MD  aspirin EC 81 MG tablet Take 81 mg by mouth daily.     Historical Provider, MD  benzonatate (TESSALON) 200 MG capsule Take 1 capsule (200 mg total) by mouth 3 (three) times daily as needed for cough. 05/14/15   Tammy S Parrett, NP  doxycycline (VIBRA-TABS) 100 MG tablet Take 1 tablet (100 mg total) by mouth 2 (two) times daily. 05/14/15   Tammy S Parrett, NP  Fluticasone-Salmeterol (ADVAIR DISKUS) 500-50 MCG/DOSE AEPB Inhale into the lungs.    Historical Provider, MD  HYDROcodone-acetaminophen (NORCO/VICODIN) 5-325 MG  per tablet Take 1-2 tablets by mouth every 4 (four) hours as needed. 02/15/14   Elpidio Anis, PA-C  ipratropium (ATROVENT) 0.02 % nebulizer solution Take 2.5 mLs (0.5 mg total) by nebulization 2 (two) times daily. And as needed 09/04/14   Storm Frisk, MD  metFORMIN (GLUCOPHAGE) 500 MG tablet Take 500 mg by mouth 2 (two) times daily with a meal.      Historical Provider, MD  pantoprazole (PROTONIX) 40 MG tablet Take 1 tablet by mouth daily.    Historical Provider, MD  pravastatin (PRAVACHOL) 40 MG tablet Take 40 mg by mouth at bedtime.    Historical Provider, MD  PROAIR HFA 108 (90  BASE) MCG/ACT inhaler Inhale 2 puffs into the lungs every 6 (six) hours as needed. 01/09/15   Historical Provider, MD   BP 105/64 mmHg  Pulse 77  Temp(Src) 98.7 F (37.1 C) (Oral)  Resp 20  Ht 5' 5.5" (1.664 m)  Wt 185 lb (83.915 kg)  BMI 30.31 kg/m2  SpO2 99% Physical Exam  Constitutional: She is oriented to person, place, and time. She appears well-developed and well-nourished. No distress.  HENT:  Head: Normocephalic and atraumatic.  Neck: Normal range of motion. Neck supple.  Cardiovascular: Normal rate and regular rhythm.  Exam reveals no gallop and no friction rub.   No murmur heard. Pulmonary/Chest: Effort normal. No respiratory distress. She has wheezes. She has no rales.  There are slight expiratory rhonchi bilaterally.  Abdominal: Soft. Bowel sounds are normal. She exhibits no distension. There is no tenderness.  Musculoskeletal: Normal range of motion. She exhibits no edema.  Neurological: She is alert and oriented to person, place, and time.  Skin: Skin is warm and dry. She is not diaphoretic.  Nursing note and vitals reviewed.   ED Course  Procedures (including critical care time) Labs Review Labs Reviewed - No data to display  Imaging Review No results found. I have personally reviewed and evaluated these images and lab results as part of my medical decision-making.   EKG Interpretation None      MDM   Final diagnoses:  None    Patient feeling markedly improved after a breathing treatment. Her chest x-ray reveals no evidence for pneumonia. Due to her persistent cough which is now productive for the past 2 weeks, I will prescribe Zithromax, prednisone and have her continue her albuterol treatments at home.    Geoffery Lyons, MD 07/07/15 [phone]

## 2015-07-07 NOTE — Discharge Instructions (Signed)
Prednisone and Avelox as prescribed.  Continue your albuterol treatment every 4 hours as needed for wheezing.  Return to the ER if symptoms significantly worsen or change.   Acute Bronchitis Bronchitis is inflammation of the airways that extend from the windpipe into the lungs (bronchi). The inflammation often causes mucus to develop. This leads to a cough, which is the most common symptom of bronchitis.  In acute bronchitis, the condition usually develops suddenly and goes away over time, usually in a couple weeks. Smoking, allergies, and asthma can make bronchitis worse. Repeated episodes of bronchitis may cause further lung problems.  CAUSES Acute bronchitis is most often caused by the same virus that causes a cold. The virus can spread from person to person (contagious) through coughing, sneezing, and touching contaminated objects. SIGNS AND SYMPTOMS   Cough.   Fever.   Coughing up mucus.   Body aches.   Chest congestion.   Chills.   Shortness of breath.   Sore throat.  DIAGNOSIS  Acute bronchitis is usually diagnosed through a physical exam. Your health care provider will also ask you questions about your medical history. Tests, such as chest X-rays, are sometimes done to rule out other conditions.  TREATMENT  Acute bronchitis usually goes away in a couple weeks. Oftentimes, no medical treatment is necessary. Medicines are sometimes given for relief of fever or cough. Antibiotic medicines are usually not needed but may be prescribed in certain situations. In some cases, an inhaler may be recommended to help reduce shortness of breath and control the cough. A cool mist vaporizer may also be used to help thin bronchial secretions and make it easier to clear the chest.  HOME CARE INSTRUCTIONS  Get plenty of rest.   Drink enough fluids to keep your urine clear or pale yellow (unless you have a medical condition that requires fluid restriction). Increasing fluids may  help thin your respiratory secretions (sputum) and reduce chest congestion, and it will prevent dehydration.   Take medicines only as directed by your health care provider.  If you were prescribed an antibiotic medicine, finish it all even if you start to feel better.  Avoid smoking and secondhand smoke. Exposure to cigarette smoke or irritating chemicals will make bronchitis worse. If you are a smoker, consider using nicotine gum or skin patches to help control withdrawal symptoms. Quitting smoking will help your lungs heal faster.   Reduce the chances of another bout of acute bronchitis by washing your hands frequently, avoiding people with cold symptoms, and trying not to touch your hands to your mouth, nose, or eyes.   Keep all follow-up visits as directed by your health care provider.  SEEK MEDICAL CARE IF: Your symptoms do not improve after 1 week of treatment.  SEEK IMMEDIATE MEDICAL CARE IF:  You develop an increased fever or chills.   You have chest pain.   You have severe shortness of breath.  You have bloody sputum.   You develop dehydration.  You faint or repeatedly feel like you are going to pass out.  You develop repeated vomiting.  You develop a severe headache. MAKE SURE YOU:   Understand these instructions.  Will watch your condition.  Will get help right away if you are not doing well or get worse.   This information is not intended to replace advice given to you by your health care provider. Make sure you discuss any questions you have with your health care provider.   Document Released: 10/20/2004 Document Revised:  10/03/2014 Document Reviewed: 03/05/2013 Elsevier Interactive Patient Education [employer].

## 2015-07-16 ENCOUNTER — Encounter: Payer: Self-pay | Admitting: Adult Health

## 2015-07-16 ENCOUNTER — Ambulatory Visit (INDEPENDENT_AMBULATORY_CARE_PROVIDER_SITE_OTHER): Payer: Medicare Other | Admitting: Adult Health

## 2015-07-16 VITALS — BP 120/81 | HR 70 | Temp 98.0°F | Ht 66.0 in | Wt 186.0 lb

## 2015-07-16 DIAGNOSIS — J449 Chronic obstructive pulmonary disease, unspecified: Secondary | ICD-10-CM

## 2015-07-16 DIAGNOSIS — Z23 Encounter for immunization: Secondary | ICD-10-CM | POA: Diagnosis not present

## 2015-07-16 NOTE — Assessment & Plan Note (Addendum)
Recurrent flare in smoker  Discussed smoking cessation   Plan Mucinex DM Twice daily  As needed  Cough/congestion  Flu shot today   Follow up Dr. Vassie Loll  In 3 months and As needed   Work on not smoking -you can do it .

## 2015-07-16 NOTE — Patient Instructions (Addendum)
Mucinex DM Twice daily  As needed  Cough/congestion  Flu shot today   Follow up Dr. Vassie Loll  In 3 months and As needed   Work on not smoking -you can do it .

## 2015-07-16 NOTE — Progress Notes (Signed)
   Subjective:    Patient ID: [name], female    DOB: 1951/02/02, 64 y.o.   MRN: [id number]  HPI  64 yo female with COPD , smoker   TEST  Cleda Daub 09/06/2012  FeV1 77%  FeV1/FVC 77%  Fef 25 75 49% Spiro 09/10/2013 FeV1 64% FVC 72% Fef 25- 75  37%  FeV1/FVC 70%    07/16/2015 Follow up : COPD  Returns for follow up .  Has been having frequent COPD flares requiring abx and steroids .  Seen in ER on 10/11 given zpack  And prednisone. CXR w/ no acute process.  Remains on Advair. , says she takes regular with no missed doses.  Still smoking , discussed smoking cessation . We discussed several options for quitting  She is down to 4 cigs daily , encouragement given.  She is feeling better but still has some lingering cough and wheezing . On and off.  No fever, chest pain, orthopnea, edema, discolored mucus , or hemoptysis     Review of Systems  Constitutional:   No  weight loss, night sweats,  Fevers, chills,  +fatigue, or  lassitude.  HEENT:   No headaches,  Difficulty swallowing,  Tooth/dental problems, or  Sore throat,                No sneezing, itching, ear ache, +nasal congestion, post nasal drip,   CV:  No chest pain,  Orthopnea, PND, swelling in lower extremities, anasarca, dizziness, palpitations, syncope.   GI  No heartburn, indigestion, abdominal pain, nausea, vomiting, diarrhea, change in bowel habits, loss of appetite, bloody stools.   Resp:    No chest wall deformity  Skin: no rash or lesions.  GU: no dysuria, change in color of urine, no urgency or frequency.  No flank pain, no hematuria   MS:  No joint pain or swelling.  No decreased range of motion.  No back pain.  Psych:  No change in mood or affect. No depression or anxiety.  No memory loss.         Objective:   Physical Exam   GEN: A/Ox3; pleasant , NAD   HEENT:  [place]/AT,  EACs-clear, TMs-wnl, NOSE-clear, THROAT-clear, no lesions, no postnasal drip or exudate noted.   NECK:  Supple w/ fair ROM;  no JVD; normal carotid impulses w/o bruits; no thyromegaly or nodules palpated; no lymphadenopathy.  RESP CTA w/ no wheezing , no accessory muscle use, no dullness to percussion  CARD:  RRR, no m/r/g  , no peripheral edema, pulses intact, no cyanosis or clubbing.  GI:   Soft & nt; nml bowel sounds; no organomegaly or masses detected.  Musco: Warm bil, no deformities or joint swelling noted.   Neuro: alert, no focal deficits noted.    Skin: Warm, no lesions or rashes        Assessment & Plan:

## 2015-07-16 NOTE — Progress Notes (Signed)
Reviewed & agree with plan  

## 2015-08-13 ENCOUNTER — Ambulatory Visit: Payer: Medicare Other | Admitting: Adult Health

## 2015-08-13 DIAGNOSIS — M25561 Pain in right knee: Secondary | ICD-10-CM | POA: Diagnosis not present

## 2015-08-13 DIAGNOSIS — M179 Osteoarthritis of knee, unspecified: Secondary | ICD-10-CM | POA: Diagnosis not present

## 2015-08-13 DIAGNOSIS — R109 Unspecified abdominal pain: Secondary | ICD-10-CM | POA: Diagnosis not present

## 2015-08-13 DIAGNOSIS — M545 Low back pain: Secondary | ICD-10-CM | POA: Diagnosis not present

## 2015-08-13 DIAGNOSIS — M79674 Pain in right toe(s): Secondary | ICD-10-CM | POA: Diagnosis not present

## 2015-08-18 DIAGNOSIS — H43811 Vitreous degeneration, right eye: Secondary | ICD-10-CM | POA: Diagnosis not present

## 2015-08-24 DIAGNOSIS — M79671 Pain in right foot: Secondary | ICD-10-CM | POA: Diagnosis not present

## 2015-08-24 DIAGNOSIS — M2011 Hallux valgus (acquired), right foot: Secondary | ICD-10-CM | POA: Diagnosis not present

## 2015-08-24 DIAGNOSIS — E119 Type 2 diabetes mellitus without complications: Secondary | ICD-10-CM | POA: Diagnosis not present

## 2015-08-24 DIAGNOSIS — M2041 Other hammer toe(s) (acquired), right foot: Secondary | ICD-10-CM | POA: Diagnosis not present

## 2015-09-15 DIAGNOSIS — M179 Osteoarthritis of knee, unspecified: Secondary | ICD-10-CM | POA: Diagnosis not present

## 2015-09-15 DIAGNOSIS — F17200 Nicotine dependence, unspecified, uncomplicated: Secondary | ICD-10-CM | POA: Diagnosis not present

## 2015-09-15 DIAGNOSIS — N949 Unspecified condition associated with female genital organs and menstrual cycle: Secondary | ICD-10-CM | POA: Diagnosis not present

## 2015-09-15 DIAGNOSIS — M25561 Pain in right knee: Secondary | ICD-10-CM | POA: Diagnosis not present

## 2015-09-15 DIAGNOSIS — E118 Type 2 diabetes mellitus with unspecified complications: Secondary | ICD-10-CM | POA: Diagnosis not present

## 2015-09-15 DIAGNOSIS — E782 Mixed hyperlipidemia: Secondary | ICD-10-CM | POA: Diagnosis not present

## 2015-09-15 DIAGNOSIS — I10 Essential (primary) hypertension: Secondary | ICD-10-CM | POA: Diagnosis not present

## 2015-09-15 DIAGNOSIS — J4520 Mild intermittent asthma, uncomplicated: Secondary | ICD-10-CM | POA: Diagnosis not present

## 2015-09-25 DIAGNOSIS — Z1231 Encounter for screening mammogram for malignant neoplasm of breast: Secondary | ICD-10-CM | POA: Diagnosis not present

## 2015-10-15 ENCOUNTER — Ambulatory Visit: Payer: Medicare Other | Admitting: Pulmonary Disease

## 2015-10-19 DIAGNOSIS — R319 Hematuria, unspecified: Secondary | ICD-10-CM | POA: Diagnosis not present

## 2015-11-12 ENCOUNTER — Ambulatory Visit (INDEPENDENT_AMBULATORY_CARE_PROVIDER_SITE_OTHER): Payer: Medicare Other | Admitting: Adult Health

## 2015-11-12 ENCOUNTER — Encounter: Payer: Self-pay | Admitting: Adult Health

## 2015-11-12 VITALS — BP 131/80 | HR 64 | Ht 66.0 in | Wt 187.0 lb

## 2015-11-12 DIAGNOSIS — Z720 Tobacco use: Secondary | ICD-10-CM

## 2015-11-12 DIAGNOSIS — M1990 Unspecified osteoarthritis, unspecified site: Secondary | ICD-10-CM

## 2015-11-12 DIAGNOSIS — J449 Chronic obstructive pulmonary disease, unspecified: Secondary | ICD-10-CM

## 2015-11-12 NOTE — Assessment & Plan Note (Signed)
Smoking cessation  

## 2015-11-12 NOTE — Assessment & Plan Note (Signed)
Compensated on present regimen   Plan  Continue on Advair Twice daily  , rinse after use.  Work on not smoking .  Follow up with Dr. Vassie Loll  In 3-4 months and As needed   Please contact office for sooner follow up if symptoms do not improve or worsen or seek emergency care

## 2015-11-12 NOTE — Assessment & Plan Note (Signed)
Back pain ? Etiology , ? MSK in nature  Advised to see PCP for further evaluation.

## 2015-11-12 NOTE — Patient Instructions (Addendum)
Continue on Advair Twice daily  , rinse after use.  Follow up with Primary MD for stomach and back issues.  Work on not smoking .  Follow up with Dr. Vassie Loll  In 3-4 months and As needed   Please contact office for sooner follow up if symptoms do not improve or worsen or seek emergency care

## 2015-11-12 NOTE — Progress Notes (Signed)
   Subjective:    Patient ID: [name], female    DOB: 1951-09-25, 65 y.o.   MRN: [id number]  HPI 65 yo female with COPD , smoker   TEST  Cleda Daub 09/06/2012  FeV1 77%  FeV1/FVC 77%  Fef 25 75 49% Spiro 09/10/2013 FeV1 64% FVC 72% Fef 25- 75  37%  FeV1/FVC 70%    11/12/2015 Follow up : COPD  Returns for 4 month follow up for COPD  Still smoking , discussed smoking cessation . We discussed several options for quitting  She is down to 4-5 cigs daily , encouragement given.  No fever, chest pain, orthopnea, edema, discolored mucus , or hemoptysis  CXR 06/2015 NAD .  Complains of constipation , has to use laxative. Says she had colonscopy that was reported normal .  Complains that when she eats and takes meds feels hot and sweaty.  Has tried probiotics, and stool softnner .  Discussed following up PCP and/or GI doctor to discuss these issues. No blood stools , fever or urinary symptoms.  Complains of soreness and tenderness in right lower /mid back . No known injury .  No urinary symptoms , chest pain, orthopnea, edema , syncope .  Flu and PVX are utd .      Review of Systems Constitutional:   No  weight loss, night sweats,  Fevers, chills,  +fatigue, or  lassitude.  HEENT:   No headaches,  Difficulty swallowing,  Tooth/dental problems, or  Sore throat,                No sneezing, itching, ear ache, nasal congestion, post nasal drip,   CV:  No chest pain,  Orthopnea, PND, swelling in lower extremities, anasarca, dizziness, palpitations, syncope.   GI  No heartburn, indigestion, abdominal pain, nausea, vomiting, diarrhea, change in bowel habits, loss of appetite, bloody stools. +constipation.   Resp:    No chest wall deformity  Skin: no rash or lesions.  GU: no dysuria, change in color of urine, no urgency or frequency.  No flank pain, no hematuria   MS:  No joint pain or swelling.  No decreased range of motion.  + back pain.  Psych:  No change in mood or affect. No  depression or anxiety.  No memory loss.         Objective:   Physical Exam  Filed Vitals:   11/12/15 1009  BP: 131/80  Pulse: 64  Height: 5\' 6"  (1.676 m)  Weight: 187 lb (84.823 kg)  SpO2: 99%    GEN: A/Ox3; pleasant , NAD , obese   HEENT:  [place]/AT,  EACs-clear, TMs-wnl, NOSE-clear, THROAT-clear, no lesions, no postnasal drip or exudate noted.   NECK:  Supple w/ fair ROM; no JVD; normal carotid impulses w/o bruits; no thyromegaly or nodules palpated; no lymphadenopathy.  RESP  CTA w/ no wheezing , no accessory muscle use, no dullness to percussion  CARD:  RRR, no m/r/g  , no peripheral edema, pulses intact, no cyanosis or clubbing.  GI:   Soft & nt; nml bowel sounds; no organomegaly or masses detected. No guarding or rebound   Musco: Warm bil, no deformities or joint swelling noted. No back deformity noted, tender along post back to touch   Neuro: alert, no focal deficits noted.    Skin: Warm, no lesions or rashes        Assessment & Plan:

## 2015-11-14 NOTE — Progress Notes (Signed)
Reviewed & agree with plan  

## 2015-11-24 DIAGNOSIS — K5904 Chronic idiopathic constipation: Secondary | ICD-10-CM | POA: Diagnosis not present

## 2015-11-24 DIAGNOSIS — Z13820 Encounter for screening for osteoporosis: Secondary | ICD-10-CM | POA: Diagnosis not present

## 2015-11-24 DIAGNOSIS — F17200 Nicotine dependence, unspecified, uncomplicated: Secondary | ICD-10-CM | POA: Diagnosis not present

## 2015-11-24 DIAGNOSIS — J449 Chronic obstructive pulmonary disease, unspecified: Secondary | ICD-10-CM | POA: Diagnosis not present

## 2015-12-08 ENCOUNTER — Telehealth: Payer: Self-pay | Admitting: Pulmonary Disease

## 2015-12-08 NOTE — Telephone Encounter (Signed)
Per Dr. Kriste Basque:  The only thing that helps the thick mucus is OTC Mucinex, needs to take 1200mg  BID with lots of fluids.  Needs to Quit smoking.  Needs an appointment with Dr. in [facility] office.  ------------------------------------- Patient notified of Dr. [facility] recommendations. Patient states that she had an appointment with Dr. Jodelle Green on 3/30, but that appointment had been cancelled when she saw TP on 2/16 appointment because TP said she didn't need to come back for 3-4 months.  Patient was put on the Recall list to schedule appointment in 3-4 months.  Patient did not want to schedule another appointment at this time.  Nothing further needed.

## 2015-12-08 NOTE — Telephone Encounter (Signed)
Patient coughing up a lot of mucus, clear, sometimes has yellow thickness in it.  Patient has been doing breathing treatments, inhalers, but having coughing spasms and shortness of breath.  Not taking any prednisone currently.  Patient would like something called in.   [facility] [address]  Allergies  Allergen Reactions  . Azithromycin Anaphylaxis and Swelling    Eyes swell  . Benazepril Anaphylaxis    Other reaction(s): SWELLING  . Cephalexin Anaphylaxis and Swelling  . Benadryl [Diphenhydramine Hcl] Nausea And Vomiting  . Nexium [Esomeprazole Magnesium] Palpitations  . Spiriva [Tiotropium Bromide Monohydrate] Palpitations   Current Outpatient Prescriptions on File Prior to Visit  Medication Sig Dispense Refill  . albuterol (PROVENTIL) (2.5 MG/3ML) 0.083% nebulizer solution Take 3 mLs (2.5 mg total) by nebulization 2 (two) times daily. And as needed For wheezing 75 mL 0  . albuterol-ipratropium (COMBIVENT) 18-103 MCG/ACT inhaler Inhale 2 puffs into the lungs every 6 (six) hours as needed for wheezing. 1 Inhaler 0  . amLODipine (NORVASC) 10 MG tablet Take 10 mg by mouth daily.    Marland Kitchen aspirin EC 81 MG tablet Take 81 mg by mouth daily.     . benzonatate (TESSALON) 200 MG capsule Take 1 capsule (200 mg total) by mouth 3 (three) times daily as needed for cough. (Patient not taking: Reported on 11/12/2015) 30 capsule 1  . Fluticasone-Salmeterol (ADVAIR DISKUS) 500-50 MCG/DOSE AEPB Inhale into the lungs.    . hydrochlorothiazide (HYDRODIURIL) 25 MG tablet     . HYDROcodone-acetaminophen (NORCO/VICODIN) 5-325 MG per tablet Take 1-2 tablets by mouth every 4 (four) hours as needed. 12 tablet 0  . ipratropium (ATROVENT) 0.02 % nebulizer solution Take 2.5 mLs (0.5 mg total) by nebulization 2 (two) times daily. And as needed 75 mL 0  . metFORMIN (GLUCOPHAGE) 500 MG tablet Take 500 mg by mouth 2 (two) times daily with a meal.      . pantoprazole (PROTONIX) 40 MG tablet Take 1 tablet by mouth daily.    .  pravastatin (PRAVACHOL) 40 MG tablet Take 40 mg by mouth at bedtime.    Marland Kitchen PROAIR HFA 108 (90 BASE) MCG/ACT inhaler Inhale 2 puffs into the lungs every 6 (six) hours as needed.     No current facility-administered medications on file prior to visit.   To Dr. Kriste Basque in Dr. Reginia Naas absence.

## 2015-12-13 DIAGNOSIS — J441 Chronic obstructive pulmonary disease with (acute) exacerbation: Secondary | ICD-10-CM | POA: Diagnosis not present

## 2015-12-13 DIAGNOSIS — R0602 Shortness of breath: Secondary | ICD-10-CM | POA: Diagnosis not present

## 2015-12-13 DIAGNOSIS — R05 Cough: Secondary | ICD-10-CM | POA: Diagnosis not present

## 2015-12-13 DIAGNOSIS — J45901 Unspecified asthma with (acute) exacerbation: Secondary | ICD-10-CM | POA: Diagnosis not present

## 2015-12-13 DIAGNOSIS — R062 Wheezing: Secondary | ICD-10-CM | POA: Diagnosis not present

## 2015-12-24 ENCOUNTER — Ambulatory Visit: Payer: Medicare Other | Admitting: Pulmonary Disease

## 2016-01-19 DIAGNOSIS — J449 Chronic obstructive pulmonary disease, unspecified: Secondary | ICD-10-CM | POA: Diagnosis not present

## 2016-01-19 DIAGNOSIS — E119 Type 2 diabetes mellitus without complications: Secondary | ICD-10-CM | POA: Diagnosis not present

## 2016-01-19 DIAGNOSIS — F17210 Nicotine dependence, cigarettes, uncomplicated: Secondary | ICD-10-CM | POA: Diagnosis not present

## 2016-01-19 DIAGNOSIS — E669 Obesity, unspecified: Secondary | ICD-10-CM | POA: Diagnosis not present

## 2016-01-19 DIAGNOSIS — J45909 Unspecified asthma, uncomplicated: Secondary | ICD-10-CM | POA: Diagnosis not present

## 2016-01-19 DIAGNOSIS — G8929 Other chronic pain: Secondary | ICD-10-CM | POA: Diagnosis not present

## 2016-01-19 DIAGNOSIS — K219 Gastro-esophageal reflux disease without esophagitis: Secondary | ICD-10-CM | POA: Diagnosis not present

## 2016-01-19 DIAGNOSIS — E782 Mixed hyperlipidemia: Secondary | ICD-10-CM | POA: Diagnosis not present

## 2016-01-19 DIAGNOSIS — I10 Essential (primary) hypertension: Secondary | ICD-10-CM | POA: Diagnosis not present

## 2016-01-19 DIAGNOSIS — K5900 Constipation, unspecified: Secondary | ICD-10-CM | POA: Diagnosis not present

## 2016-01-19 DIAGNOSIS — M25561 Pain in right knee: Secondary | ICD-10-CM | POA: Diagnosis not present

## 2016-02-18 ENCOUNTER — Ambulatory Visit: Payer: Medicare Other | Admitting: Pulmonary Disease

## 2016-03-04 DIAGNOSIS — E119 Type 2 diabetes mellitus without complications: Secondary | ICD-10-CM | POA: Diagnosis not present

## 2016-03-04 DIAGNOSIS — H40011 Open angle with borderline findings, low risk, right eye: Secondary | ICD-10-CM | POA: Diagnosis not present

## 2016-03-09 DIAGNOSIS — S0195XA Open bite of unspecified part of head, initial encounter: Secondary | ICD-10-CM | POA: Diagnosis not present

## 2016-03-09 DIAGNOSIS — I10 Essential (primary) hypertension: Secondary | ICD-10-CM | POA: Diagnosis not present

## 2016-03-09 DIAGNOSIS — R05 Cough: Secondary | ICD-10-CM | POA: Diagnosis not present

## 2016-03-09 DIAGNOSIS — F17210 Nicotine dependence, cigarettes, uncomplicated: Secondary | ICD-10-CM | POA: Diagnosis not present

## 2016-03-09 DIAGNOSIS — E785 Hyperlipidemia, unspecified: Secondary | ICD-10-CM | POA: Diagnosis not present

## 2016-03-09 DIAGNOSIS — E119 Type 2 diabetes mellitus without complications: Secondary | ICD-10-CM | POA: Diagnosis not present

## 2016-03-09 DIAGNOSIS — S0086XA Insect bite (nonvenomous) of other part of head, initial encounter: Secondary | ICD-10-CM | POA: Diagnosis not present

## 2016-03-09 DIAGNOSIS — R0600 Dyspnea, unspecified: Secondary | ICD-10-CM | POA: Diagnosis not present

## 2016-03-10 DIAGNOSIS — T6394XA Toxic effect of contact with unspecified venomous animal, undetermined, initial encounter: Secondary | ICD-10-CM | POA: Diagnosis not present

## 2016-03-10 DIAGNOSIS — R062 Wheezing: Secondary | ICD-10-CM | POA: Diagnosis not present

## 2016-03-31 DIAGNOSIS — H903 Sensorineural hearing loss, bilateral: Secondary | ICD-10-CM | POA: Diagnosis not present

## 2016-03-31 DIAGNOSIS — F17210 Nicotine dependence, cigarettes, uncomplicated: Secondary | ICD-10-CM | POA: Diagnosis not present

## 2016-05-26 ENCOUNTER — Ambulatory Visit: Payer: Medicare Other | Admitting: Pulmonary Disease

## 2016-06-02 ENCOUNTER — Telehealth: Payer: Self-pay | Admitting: Pulmonary Disease

## 2016-06-02 ENCOUNTER — Ambulatory Visit (INDEPENDENT_AMBULATORY_CARE_PROVIDER_SITE_OTHER): Payer: Medicare Other | Admitting: Adult Health

## 2016-06-02 ENCOUNTER — Encounter: Payer: Self-pay | Admitting: Adult Health

## 2016-06-02 DIAGNOSIS — J441 Chronic obstructive pulmonary disease with (acute) exacerbation: Secondary | ICD-10-CM

## 2016-06-02 MED ORDER — BENZONATATE 200 MG PO CAPS: 200.0000 mg | ORAL_CAPSULE | Freq: Three times a day (TID) | ORAL | 1 refills | Status: DC | PRN

## 2016-06-02 MED ORDER — DOXYCYCLINE HYCLATE 100 MG PO TABS: 100.0000 mg | ORAL_TABLET | Freq: Two times a day (BID) | ORAL | 0 refills | Status: DC

## 2016-06-02 MED FILL — DOXYCYCLINE HYCLATE 100 MG: 100 | 7 days supply | Qty: 14 | Fill #0

## 2016-06-02 MED FILL — BENZONATATE 200 MG CAPSULE: 200 | 10 days supply | Qty: 30 | Fill #0

## 2016-06-02 NOTE — Telephone Encounter (Signed)
Pt has ov with TP on 06/02/16 at 9:30. Nothing further needed at this time.

## 2016-06-02 NOTE — Patient Instructions (Addendum)
Doxycycline 100mg  Twice daily  For 7 days , take with food.  Wear sunscreen as may cause you to sunburn while taking .  Mucinex DM Twice daily  As needed  Cough/congestion  Tessalon Three times a day  As needed  Cough  Please contact office for sooner follow up if symptoms do not improve or worsen or seek emergency care  follow up Dr.  In  6 months  Work on not smoking .

## 2016-06-02 NOTE — Assessment & Plan Note (Signed)
Flare ,  Smoking cessation discussed   Plan  Patient Instructions  Doxycycline 100mg  Twice daily  For 7 days , take with food.  Wear sunscreen as may cause you to sunburn while taking .  Mucinex DM Twice daily  As needed  Cough/congestion  Tessalon Three times a day  As needed  Cough  Please contact office for sooner follow up if symptoms do not improve or worsen or seek emergency care  follow up Dr.  In 3 months  Work on not smoking .

## 2016-06-02 NOTE — Progress Notes (Signed)
Subjective:    Patient ID: [name], female    DOB: [birth year], 65 y.o.   MRN: [id number]  HPI 65 yo female with COPD , smoker   TEST  Cleda Daub 09/06/2012  FeV1 77%  FeV1/FVC 77%  Fef 25 75 49% Spiro 09/10/2013 FeV1 64% FVC 72% Fef 25- 75  37%  FeV1/FVC 70%    06/02/2016 Acute OV  : COPD  Returns for acute office visit. Complains of  1 week cough, congestion , tightness, with thick mucus .  Still smoking , discussed smoking cessation . We discussed several options for quitting  Denies chest pain, orthopnea, edema , hemoptysis , fever or n/v.  Remains on Advair . Has combivent As needed   No recent travel or abx use.  CXR 06/2015 was nml .  Prevnar utd.   Past Medical History:  Diagnosis Date  . Arthritis   . Asthma   . COPD (chronic obstructive pulmonary disease) (HCC)   . Diabetes mellitus   . Heart murmur   . High cholesterol   . Hyperlipidemia   . Hypertension   . Kidney infection   . Kidney stone   . Reflux    Current Outpatient Prescriptions on File Prior to Visit  Medication Sig Dispense Refill  . albuterol (PROVENTIL) (2.5 MG/3ML) 0.083% nebulizer solution Take 3 mLs (2.5 mg total) by nebulization 2 (two) times daily. And as needed For wheezing 75 mL 0  . albuterol-ipratropium (COMBIVENT) 18-103 MCG/ACT inhaler Inhale 2 puffs into the lungs every 6 (six) hours as needed for wheezing. 1 Inhaler 0  . amLODipine (NORVASC) 10 MG tablet Take 10 mg by mouth daily.    Marland Kitchen aspirin EC 81 MG tablet Take 81 mg by mouth daily.     . Fluticasone-Salmeterol (ADVAIR DISKUS) 500-50 MCG/DOSE AEPB Inhale into the lungs.    Marland Kitchen ipratropium (ATROVENT) 0.02 % nebulizer solution Take 2.5 mLs (0.5 mg total) by nebulization 2 (two) times daily. And as needed 75 mL 0  . metFORMIN (GLUCOPHAGE) 500 MG tablet Take 500 mg by mouth 2 (two) times daily with a meal.      . pantoprazole (PROTONIX) 40 MG tablet Take 1 tablet by mouth daily.    . pravastatin (PRAVACHOL) 40 MG tablet Take 40 mg by  mouth at bedtime.    Marland Kitchen PROAIR HFA 108 (90 BASE) MCG/ACT inhaler Inhale 2 puffs into the lungs every 6 (six) hours as needed.    . benzonatate (TESSALON) 200 MG capsule Take 1 capsule (200 mg total) by mouth 3 (three) times daily as needed for cough. (Patient not taking: Reported on 11/12/2015) 30 capsule 1  . HYDROcodone-acetaminophen (NORCO/VICODIN) 5-325 MG per tablet Take 1-2 tablets by mouth every 4 (four) hours as needed. (Patient not taking: Reported on 06/02/2016) 12 tablet 0   No current facility-administered medications on file prior to visit.        Review of Systems Constitutional:   No  weight loss, night sweats,  Fevers, chills,  +fatigue, or  lassitude.  HEENT:   No headaches,  Difficulty swallowing,  Tooth/dental problems, or  Sore throat,                No sneezing, itching, ear ache, nasal congestion, post nasal drip,   CV:  No chest pain,  Orthopnea, PND, swelling in lower extremities, anasarca, dizziness, palpitations, syncope.   GI  No heartburn, indigestion, abdominal pain, nausea, vomiting, diarrhea, change in bowel habits, loss of appetite, bloody stools.  Resp:    No chest wall deformity  Skin: no rash or lesions.  GU: no dysuria, change in color of urine, no urgency or frequency.  No flank pain, no hematuria   MS:  No joint pain or swelling.  No decreased range of motion.  + back pain.  Psych:  No change in mood or affect. No depression or anxiety.  No memory loss.         Objective:   Physical Exam  Vitals:   06/02/16 0936  BP: 126/77  Pulse: 60  SpO2: 99%  Weight: 190 lb (86.2 kg)  Height: 5\' 6"  (1.676 m)    GEN: A/Ox3; pleasant , NAD , obese    HEENT:  [place]/AT,  EACs-clear, TMs-wnl, NOSE-clear, THROAT-clear, no lesions, no postnasal drip or exudate noted.   NECK:  Supple w/ fair ROM; no JVD; normal carotid impulses w/o bruits; no thyromegaly or nodules palpated; no lymphadenopathy.    RESP  Few trace rhonchi , no accessory muscle use, no  dullness to percussion  CARD:  RRR, no m/r/g  , no peripheral edema, pulses intact, no cyanosis or clubbing.  GI:   Soft & nt; nml bowel sounds; no organomegaly or masses detected.  No guarding or rebound   Musco: Warm bil, no deformities or joint swelling noted. No back deformity noted, tender along post back to touch   Neuro: alert, no focal deficits noted.    Skin: Warm, no lesions or rashes       Tammy Parrett NP-C  [facility]  06/02/2016

## 2016-06-02 NOTE — Telephone Encounter (Signed)
Appoint already scheduled..disguard msg.Caren Griffins

## 2016-06-07 DIAGNOSIS — K5900 Constipation, unspecified: Secondary | ICD-10-CM | POA: Diagnosis not present

## 2016-06-07 NOTE — Progress Notes (Signed)
Reviewed & agree with plan  

## 2016-06-14 ENCOUNTER — Telehealth: Payer: Self-pay | Admitting: Pulmonary Disease

## 2016-06-14 DIAGNOSIS — R059 Cough, unspecified: Secondary | ICD-10-CM

## 2016-06-14 DIAGNOSIS — R05 Cough: Secondary | ICD-10-CM

## 2016-06-14 NOTE — Telephone Encounter (Signed)
Patient returning call she can be reached at [phone]

## 2016-06-14 NOTE — Telephone Encounter (Signed)
Spoke with pt, will come in either this evening or tomorrow morning for cxr.  Nothing further needed at this time.

## 2016-06-14 NOTE — Telephone Encounter (Signed)
Pl schedule CXR

## 2016-06-14 NOTE — Telephone Encounter (Signed)
Pt c/o cough, PND, chest congestion with clear mucus and wheezing.  Pt states that she is still smoking. Using all meds as directed. Pt has not been using Mucinex DM - never picked up from drug store.  Pt has been using Tessalon Perles for cough suppression.  Last OV was given Doxy - pt took this as directed.   Patient Instructions  Doxycycline 100mg  Twice daily  For 7 days , take with food.  Wear sunscreen as may cause you to sunburn while taking .  Mucinex DM Twice daily  As needed  Cough/congestion  Tessalon Three times a day  As needed  Cough  Please contact office for sooner follow up if symptoms do not improve or worsen or seek emergency care  follow up Dr.  In 3 months  Work on not smoking .   Please advise Dr Vassie Loll of any rec's. Thanks.

## 2016-06-14 NOTE — Telephone Encounter (Signed)
LM x 1 

## 2016-06-15 ENCOUNTER — Ambulatory Visit (HOSPITAL_BASED_OUTPATIENT_CLINIC_OR_DEPARTMENT_OTHER)
Admission: RE | Admit: 2016-06-15 | Discharge: 2016-06-15 | Disposition: A | Discharge status: Home / Self Care | Payer: Medicare Other | Source: Ambulatory Visit | Attending: Pulmonary Disease | Admitting: Pulmonary Disease

## 2016-06-15 DIAGNOSIS — R05 Cough: Secondary | ICD-10-CM | POA: Diagnosis not present

## 2016-06-15 DIAGNOSIS — R059 Cough, unspecified: Secondary | ICD-10-CM

## 2016-06-23 ENCOUNTER — Ambulatory Visit (INDEPENDENT_AMBULATORY_CARE_PROVIDER_SITE_OTHER): Payer: Medicare Other | Admitting: Pulmonary Disease

## 2016-06-23 ENCOUNTER — Telehealth: Payer: Self-pay | Admitting: Pulmonary Disease

## 2016-06-23 ENCOUNTER — Encounter: Payer: Self-pay | Admitting: Pulmonary Disease

## 2016-06-23 DIAGNOSIS — J441 Chronic obstructive pulmonary disease with (acute) exacerbation: Secondary | ICD-10-CM | POA: Diagnosis not present

## 2016-06-23 DIAGNOSIS — Z720 Tobacco use: Secondary | ICD-10-CM | POA: Diagnosis not present

## 2016-06-23 MED ORDER — PREDNISONE 10 MG PO TABS: ORAL_TABLET | ORAL | 0 refills | Status: DC

## 2016-06-23 MED ORDER — UMECLIDINIUM-VILANTEROL 62.5-25 MCG/INH IN AEPB: 1.0000 | INHALATION_SPRAY | Freq: Every day | RESPIRATORY_TRACT | 0 refills | Status: DC

## 2016-06-23 NOTE — Telephone Encounter (Signed)
Rx sent to Pharmacy. Nothing further needed.

## 2016-06-23 NOTE — Patient Instructions (Signed)
Trial of ANORO once daily-instead of Advair - call for prescription of this works  Use Proventil 2 puffs as needed for rescue  OK to refill Tessalon Perles  Prednisone 10 mg tabs  Take 2 tabs daily with food x 5ds, then 1 tab daily with food x 5ds then STOP  Take the flu shot once her breathing is doing better

## 2016-06-23 NOTE — Assessment & Plan Note (Signed)
Encouraged to quit smoking.  

## 2016-06-23 NOTE — Addendum Note (Signed)
Addended by: York Ram on: 06/23/2016 04:22 PM   Modules accepted: Orders

## 2016-06-23 NOTE — Progress Notes (Signed)
   Subjective:    Patient ID: [name], female    DOB: 17-Apr-1951, 65 y.o.   MRN: [id number]  HPI  65 yo female with COPD , smoker   06/23/2016  Chief Complaint  Patient presents with  . Follow-up    CXR done 06/15/16; needs refills on meds.  Cough is not as bad.  wants to check peak flow   Former [facility] pt She continues to smoke 3-4 cigarettes per day For the last few weeks, she feels difficulty with expiration. Inspiration is fine, occasional wheezing. Cough is overall better but she would like a refill on Tessalon She is compliant with Advair and uses Proventil for rescue -she wonders if Advair is working for her She has rarely needed Atrovent nebs  Chest x-ray was recently done which did not show any infiltrates or effusions  Significant tests/ events  Cleda Daub 09/06/2012  FeV1 77%  FeV1/FVC 77%  Fef 25 75 49% Spiro 09/10/2013 FeV1 64% FVC 72% Fef 25- 75  37%  FeV1/FVC 70%      Review of Systems neg for any significant sore throat, dysphagia, itching, sneezing, nasal congestion or excess/ purulent secretions, fever, chills, sweats, unintended wt loss, pleuritic or exertional cp, hempoptysis, orthopnea pnd or change in chronic leg swelling.   Also denies presyncope, palpitations, heartburn, abdominal pain, nausea, vomiting, diarrhea or change in bowel or urinary habits, dysuria,hematuria, rash, arthralgias, visual complaints, headache, numbness weakness or ataxia.     Objective:   Physical Exam  Gen. Pleasant, well-nourished, in no distress ENT - no lesions, no post nasal drip Neck: No JVD, no thyromegaly, no carotid bruits Lungs: no use of accessory muscles, no dullness to percussion,Decreased without rales or rhonchi  Cardiovascular: Rhythm regular, heart sounds  normal, no murmurs or gallops, no peripheral edema Musculoskeletal: No deformities, no cyanosis or clubbing        Assessment & Plan:

## 2016-06-23 NOTE — Assessment & Plan Note (Signed)
Trial of ANORO once daily-instead of Advair - call for prescription of this works  Use Proventil 2 puffs as needed for rescue  OK to refill Tessalon Perles  Prednisone 10 mg tabs  Take 2 tabs daily with food x 5ds, then 1 tab daily with food x 5ds then STOP  Take the flu shot once her breathing is doing better 

## 2016-06-24 ENCOUNTER — Other Ambulatory Visit (INDEPENDENT_AMBULATORY_CARE_PROVIDER_SITE_OTHER): Payer: Self-pay | Admitting: Orthopaedic Surgery

## 2016-06-24 DIAGNOSIS — M25561 Pain in right knee: Secondary | ICD-10-CM

## 2016-06-27 ENCOUNTER — Ambulatory Visit (HOSPITAL_BASED_OUTPATIENT_CLINIC_OR_DEPARTMENT_OTHER)
Admission: RE | Admit: 2016-06-27 | Discharge: 2016-06-27 | Disposition: A | Discharge status: Home / Self Care | Payer: Medicare Other | Source: Ambulatory Visit | Attending: Orthopaedic Surgery | Admitting: Orthopaedic Surgery

## 2016-06-27 ENCOUNTER — Ambulatory Visit (INDEPENDENT_AMBULATORY_CARE_PROVIDER_SITE_OTHER): Payer: Medicare Other | Admitting: Orthopaedic Surgery

## 2016-06-27 ENCOUNTER — Telehealth: Payer: Self-pay | Admitting: Pulmonary Disease

## 2016-06-27 DIAGNOSIS — M25561 Pain in right knee: Secondary | ICD-10-CM | POA: Diagnosis not present

## 2016-06-27 DIAGNOSIS — M179 Osteoarthritis of knee, unspecified: Secondary | ICD-10-CM | POA: Diagnosis not present

## 2016-06-27 DIAGNOSIS — J441 Chronic obstructive pulmonary disease with (acute) exacerbation: Secondary | ICD-10-CM

## 2016-06-27 DIAGNOSIS — Z720 Tobacco use: Secondary | ICD-10-CM

## 2016-06-27 NOTE — Telephone Encounter (Signed)
lmtcb

## 2016-06-28 MED ORDER — UMECLIDINIUM-VILANTEROL 62.5-25 MCG/INH IN AEPB: 1.0000 | INHALATION_SPRAY | Freq: Every day | RESPIRATORY_TRACT | 5 refills | Status: DC

## 2016-06-28 NOTE — Telephone Encounter (Signed)
Spoke with pt. She would like a prescription for Anoro to be sent to her pharmacy. This has been done. Nothing further was needed.

## 2016-06-28 NOTE — Telephone Encounter (Signed)
Pt returning call.[name] ° °

## 2016-07-26 DIAGNOSIS — K219 Gastro-esophageal reflux disease without esophagitis: Secondary | ICD-10-CM | POA: Diagnosis not present

## 2016-07-26 DIAGNOSIS — E782 Mixed hyperlipidemia: Secondary | ICD-10-CM | POA: Diagnosis not present

## 2016-07-26 DIAGNOSIS — J449 Chronic obstructive pulmonary disease, unspecified: Secondary | ICD-10-CM | POA: Diagnosis not present

## 2016-07-26 DIAGNOSIS — E1169 Type 2 diabetes mellitus with other specified complication: Secondary | ICD-10-CM | POA: Diagnosis not present

## 2016-07-26 DIAGNOSIS — E669 Obesity, unspecified: Secondary | ICD-10-CM | POA: Diagnosis not present

## 2016-07-26 DIAGNOSIS — Z23 Encounter for immunization: Secondary | ICD-10-CM | POA: Diagnosis not present

## 2016-07-26 DIAGNOSIS — I10 Essential (primary) hypertension: Secondary | ICD-10-CM | POA: Diagnosis not present

## 2016-07-26 DIAGNOSIS — F17210 Nicotine dependence, cigarettes, uncomplicated: Secondary | ICD-10-CM | POA: Diagnosis not present

## 2016-08-01 ENCOUNTER — Ambulatory Visit (INDEPENDENT_AMBULATORY_CARE_PROVIDER_SITE_OTHER): Payer: Medicare Other | Admitting: Orthopaedic Surgery

## 2016-08-01 DIAGNOSIS — M25561 Pain in right knee: Secondary | ICD-10-CM | POA: Diagnosis not present

## 2016-08-01 DIAGNOSIS — G8929 Other chronic pain: Secondary | ICD-10-CM | POA: Diagnosis not present

## 2016-08-01 NOTE — Progress Notes (Signed)
[name] is following up after having a steroid injection in her right knee. She says the right knee has done much better overall. He purchase a little bit. She does take meloxicam. Right now she is not interested in any other intervention. I talked her about the possibility of a hyaluronic acid injection down the road.  On examination of her right knee that is only minimal swelling. Her range of motion is full and the knee feels like it is a stable. Her knee is also strong.  At this point she'll follow-up as needed. I gave her handout on ironic acid to consider that type of injection of her data for knee Bowsher enough reason to consider repeat steroid injection. I'll her work on quad strengthening exercises and take anti-inflammatories as needed. She will follow up as needed.

## 2016-08-08 ENCOUNTER — Telehealth: Payer: Self-pay | Admitting: Pulmonary Disease

## 2016-08-08 MED ORDER — PREDNISONE 10 MG PO TABS: ORAL_TABLET | ORAL | 0 refills | Status: DC

## 2016-08-08 NOTE — Telephone Encounter (Signed)
Spoke with pt. States that she had her flu shot on Friday with her PCP. Since then she has been having increased coughing, chest congestion and wheezing. Cough is producing clear to yellow mucus. Denies chest tightness, SOB or fever. Would like to have RA's recommendations.  RA - please advise. Thanks.

## 2016-08-08 NOTE — Telephone Encounter (Signed)
Pt aware of rec's per RA Prednisone sent to [facility] on [address] in [place] Nothing further needed.

## 2016-08-08 NOTE — Telephone Encounter (Signed)
Prednisone 10 mg tabs  Take 2 tabs daily with food x 5ds, then 1 tab daily with food x 5ds then STOP   Take this only for wheezing or if breathing gets worse

## 2016-08-16 ENCOUNTER — Other Ambulatory Visit: Payer: Self-pay | Admitting: Pulmonary Disease

## 2016-08-16 NOTE — Telephone Encounter (Signed)
Patient is also requesting Tessalon pills.Charm Rings

## 2016-08-16 NOTE — Telephone Encounter (Signed)
RA  Pt. Called back requesting a refill of her Prednisone and a rx for Tessalon to be sent in. She states she has one more day of her Prednisone left and she is concerned that after she has finished the prednisone that if she is still completely  she won't have anything else to take. She is still coughing with yellowish mucus with chest congestion. Has some chest tightness and some wheezing. Denies fever,chills.

## 2016-08-17 ENCOUNTER — Encounter (HOSPITAL_BASED_OUTPATIENT_CLINIC_OR_DEPARTMENT_OTHER): Payer: Self-pay | Admitting: Emergency Medicine

## 2016-08-17 ENCOUNTER — Emergency Department (HOSPITAL_BASED_OUTPATIENT_CLINIC_OR_DEPARTMENT_OTHER): Payer: Medicare Other

## 2016-08-17 ENCOUNTER — Emergency Department (HOSPITAL_BASED_OUTPATIENT_CLINIC_OR_DEPARTMENT_OTHER)
Admission: EM | Admit: 2016-08-17 | Discharge: 2016-08-17 | Disposition: A | Discharge status: Home / Self Care | Payer: Medicare Other | Attending: Emergency Medicine | Admitting: Emergency Medicine

## 2016-08-17 DIAGNOSIS — Z79899 Other long term (current) drug therapy: Secondary | ICD-10-CM | POA: Diagnosis not present

## 2016-08-17 DIAGNOSIS — J441 Chronic obstructive pulmonary disease with (acute) exacerbation: Secondary | ICD-10-CM

## 2016-08-17 DIAGNOSIS — Z7984 Long term (current) use of oral hypoglycemic drugs: Secondary | ICD-10-CM | POA: Insufficient documentation

## 2016-08-17 DIAGNOSIS — E119 Type 2 diabetes mellitus without complications: Secondary | ICD-10-CM | POA: Diagnosis not present

## 2016-08-17 DIAGNOSIS — I10 Essential (primary) hypertension: Secondary | ICD-10-CM | POA: Diagnosis not present

## 2016-08-17 DIAGNOSIS — J069 Acute upper respiratory infection, unspecified: Secondary | ICD-10-CM | POA: Diagnosis not present

## 2016-08-17 DIAGNOSIS — J45909 Unspecified asthma, uncomplicated: Secondary | ICD-10-CM | POA: Insufficient documentation

## 2016-08-17 DIAGNOSIS — Z7982 Long term (current) use of aspirin: Secondary | ICD-10-CM | POA: Diagnosis not present

## 2016-08-17 DIAGNOSIS — R05 Cough: Secondary | ICD-10-CM | POA: Diagnosis not present

## 2016-08-17 DIAGNOSIS — F17210 Nicotine dependence, cigarettes, uncomplicated: Secondary | ICD-10-CM | POA: Insufficient documentation

## 2016-08-17 MED ORDER — BENZONATATE 100 MG PO CAPS
100.0000 mg | ORAL_CAPSULE | Freq: Once | ORAL | Status: AC
  Administered 2016-08-17: 100 mg via ORAL
  Filled 2016-08-17: qty 1

## 2016-08-17 MED ORDER — DOXYCYCLINE HYCLATE 100 MG PO CAPS: 100.0000 mg | ORAL_CAPSULE | Freq: Two times a day (BID) | ORAL | 0 refills | Status: DC

## 2016-08-17 MED ORDER — IPRATROPIUM-ALBUTEROL 0.5-2.5 (3) MG/3ML IN SOLN
3.0000 mL | RESPIRATORY_TRACT | Status: DC
  Administered 2016-08-17: 3 mL via RESPIRATORY_TRACT
  Filled 2016-08-17: qty 3

## 2016-08-17 MED ORDER — BENZONATATE 100 MG PO CAPS: 100.0000 mg | ORAL_CAPSULE | Freq: Three times a day (TID) | ORAL | 0 refills | Status: DC | PRN

## 2016-08-17 MED ORDER — ALBUTEROL SULFATE (2.5 MG/3ML) 0.083% IN NEBU: 2.5000 mg | INHALATION_SOLUTION | Freq: Four times a day (QID) | RESPIRATORY_TRACT | 12 refills | Status: DC | PRN

## 2016-08-17 MED ORDER — DOXYCYCLINE HYCLATE 100 MG PO TABS
100.0000 mg | ORAL_TABLET | Freq: Once | ORAL | Status: AC
  Administered 2016-08-17: 100 mg via ORAL
  Filled 2016-08-17: qty 1

## 2016-08-17 MED ORDER — ONDANSETRON HCL 4 MG/2ML IJ SOLN: 4.0000 mg | Freq: Once | INTRAMUSCULAR | Status: DC

## 2016-08-17 MED FILL — ALBUTEROL 0.083% INHAL SOLN: (2.5 MG/3ML | 7 days supply | Qty: 75 | Fill #0

## 2016-08-17 MED FILL — BENZONATATE 100 MG CAPSULE: 100 | 10 days supply | Qty: 30 | Fill #0

## 2016-08-17 MED FILL — DOXYCYCLINE HYC 100 MG CAP: 100 | 10 days supply | Qty: 20 | Fill #0

## 2016-08-17 NOTE — ED Triage Notes (Signed)
Pt reports cough and congestion x 10 days. Pt was seen previously and is finishing steroids for same. Pt reports no improvement in symptoms.

## 2016-08-17 NOTE — ED Provider Notes (Addendum)
[facility] Provider Note   CSN: [id number] Arrival date & time: 08/17/16  0631     History   Chief Complaint Chief Complaint  Patient presents with  . URI    HPI [name] is a 65 y.o. female.   Cough  This is a recurrent problem. The current episode started more than 1 week ago. The problem occurs constantly. The problem has been gradually worsening. The cough is productive of sputum. Maximum temperature: subjective. The fever has been present for less than 1 day. Associated symptoms include ear congestion, rhinorrhea, sore throat, shortness of breath and wheezing. Pertinent negatives include no chest pain and no chills. Treatments tried: prednisoen, tessalon. The treatment provided moderate relief. She is a smoker. Her past medical history is significant for COPD.    Past Medical History:  Diagnosis Date  . Arthritis   . Asthma   . COPD (chronic obstructive pulmonary disease) (HCC)   . Diabetes mellitus   . Heart murmur   . High cholesterol   . Hyperlipidemia   . Hypertension   . Kidney infection   . Kidney stone   . Reflux     Patient Active Problem List   Diagnosis Date Noted  . Tobacco abuse 09/06/2012  . Hyperlipidemia 09/02/2012  . DM (diabetes mellitus) (HCC) 09/02/2012  . COPD (chronic obstructive pulmonary disease) gold stage B with ongoing tobacco use 09/02/2012  . Arthritis 09/02/2012  . Hypertension 06/01/2011    Past Surgical History:  Procedure Laterality Date  . fillopian tube removal    . right breast cyst removal      OB History    No data available       Home Medications    Prior to Admission medications   Medication Sig Start Date End Date Taking? Authorizing Provider  albuterol (PROVENTIL) (2.5 MG/3ML) 0.083% nebulizer solution Take 3 mLs (2.5 mg total) by nebulization 2 (two) times daily. And as needed For wheezing 11/01/12   Storm Frisk, MD  amLODipine (NORVASC) 10 MG tablet Take 10 mg by mouth daily.     Historical Provider, MD  aspirin EC 81 MG tablet Take 81 mg by mouth daily.     Historical Provider, MD  benzonatate (TESSALON) 100 MG capsule Take 1 capsule (100 mg total) by mouth 3 (three) times daily as needed for cough. 08/17/16   Marily Memos, MD  doxycycline (VIBRAMYCIN) 100 MG capsule Take 1 capsule (100 mg total) by mouth 2 (two) times daily. 08/17/16   Marily Memos, MD  Fluticasone-Salmeterol (ADVAIR DISKUS) 500-50 MCG/DOSE AEPB Inhale into the lungs.    Historical Provider, MD  ipratropium (ATROVENT) 0.02 % nebulizer solution Take 2.5 mLs (0.5 mg total) by nebulization 2 (two) times daily. And as needed 09/04/14   Storm Frisk, MD  meloxicam [facility]) 7.5 MG tablet  06/27/16   Historical Provider, MD  metFORMIN (GLUCOPHAGE) 500 MG tablet Take 500 mg by mouth 2 (two) times daily with a meal.      Historical Provider, MD  pantoprazole (PROTONIX) 40 MG tablet Take 1 tablet by mouth daily.    Historical Provider, MD  pravastatin (PRAVACHOL) 40 MG tablet Take 40 mg by mouth at bedtime.    Historical Provider, MD  predniSONE (DELTASONE) 10 MG tablet Take 2 tabs daily with food x 5ds, then 1 tab daily with food x 5ds then STOP 06/23/16   Oretha Milch, MD  predniSONE (DELTASONE) 10 MG tablet Take 2 tabs daily with food x  5 days, then 1 tab daily with food x 5 days and STOP. 08/08/16   Oretha Milchakesh V Alva, MD  umeclidinium-vilanterol (ANORO ELLIPTA) 62.5-25 MCG/INH AEPB Inhale 1 puff into the lungs daily. 06/28/16   Oretha Milchakesh V Alva, MD    Family History Family History  Problem Relation Age of Onset  . CVA    . Hypertension    . Diabetes Mellitus II      Social History Social History  Substance Use Topics  . Smoking status: Current Every Day Smoker    Packs/day: 0.25    Years: 45.00    Types: Cigarettes  . Smokeless tobacco: Current User    Types: Chew     Comment: Started smoking at age 65.  Currently smoking 2-3 cig per day  . Alcohol use 0.0 oz/week     Comment: Socially only      Allergies   Azithromycin; Benazepril; Cephalexin; Benadryl [diphenhydramine hcl]; Nexium [esomeprazole magnesium]; and Spiriva [tiotropium bromide monohydrate]   Review of Systems Review of Systems  Constitutional: Negative for chills.  HENT: Positive for rhinorrhea and sore throat.   Respiratory: Positive for cough, shortness of breath and wheezing.   Cardiovascular: Negative for chest pain.  All other systems reviewed and are negative.    Physical Exam Updated Vital Signs BP 155/77 (BP Location: Right Arm)   Pulse (!) 56   Temp 98.7 F (37.1 C) (Oral)   Resp 18   Ht 5\' 5"  (1.651 m)   Wt 192 lb (87.1 kg)   SpO2 100%   BMI 31.95 kg/m   Physical Exam  Constitutional: She appears well-developed and well-nourished. No distress.  HENT:  Head: Normocephalic and atraumatic.  Eyes: Conjunctivae are normal.  Neck: Normal range of motion. Neck supple.  Cardiovascular: Normal rate and regular rhythm.   No murmur heard. Pulmonary/Chest: Effort normal. No respiratory distress. She has decreased breath sounds. She has wheezes.  Abdominal: Soft. There is no tenderness. There is no rebound.  Musculoskeletal: She exhibits no edema or deformity.  Neurological: She is alert.  Skin: Skin is warm and dry.  Psychiatric: She has a normal mood and affect.  Nursing note and vitals reviewed.    ED Treatments / Results  Labs (all labs ordered are listed, but only abnormal results are displayed) Labs Reviewed - No data to display  EKG  EKG Interpretation None       Radiology Dg Chest 2 View  Result Date: 08/17/2016 CLINICAL DATA:  Cough and congestion for 10 days. EXAM: CHEST  2 VIEW COMPARISON:  06/15/2016 FINDINGS: The heart size and mediastinal contours are within normal limits. Both lungs are clear. The visualized skeletal structures are unremarkable. IMPRESSION: Negative two view chest x-ray Electronically Signed   By: Marin Robertshristopher  Mattern M.D.   On: 08/17/2016 07:22     Procedures Procedures (including critical care time)  Medications Ordered in ED Medications  ipratropium-albuterol (DUONEB) 0.5-2.5 (3) MG/3ML nebulizer solution 3 mL (not administered)  doxycycline (VIBRA-TABS) tablet 100 mg (not administered)  benzonatate (TESSALON) capsule 100 mg (not administered)     Initial Impression / Assessment and Plan / ED Course  I have reviewed the triage vital signs and the nursing notes.  Pertinent labs & imaging results that were available during my care of the patient were reviewed by me and considered in my medical decision making (see chart for details).  Clinical Course    Likely COPD exacerbation. >10 days of symptoms at this point, with persistent yellow sputum production  worse than normal, didn't improve with steroids. CXR clear. No respiratory distress. Will start abx, pulm/PCP follow up.    Final Clinical Impressions(s) / ED Diagnoses   Final diagnoses:  Upper respiratory tract infection, unspecified type  COPD exacerbation (HCC)    New Prescriptions New Prescriptions   BENZONATATE (TESSALON) 100 MG CAPSULE    Take 1 capsule (100 mg total) by mouth 3 (three) times daily as needed for cough.   DOXYCYCLINE (VIBRAMYCIN) 100 MG CAPSULE    Take 1 capsule (100 mg total) by mouth 2 (two) times daily.     Marily Memos, MD 08/17/16 8546    Marily Memos, MD 08/17/16 [phone]

## 2016-08-20 NOTE — Telephone Encounter (Signed)
Pl call this pt -she had an ED visit If still feeling bad, needs OV

## 2016-08-22 NOTE — Telephone Encounter (Signed)
Called and spoke to pt. Appt made with TP on 12.1.17, pt states she will make this appt later in the week to give herself a little more time to recover and if she is feeling well she will cancel the appt and keep the appt if she does not feel she is improving. Nothing further needed at this time.

## 2016-08-23 DIAGNOSIS — H903 Sensorineural hearing loss, bilateral: Secondary | ICD-10-CM | POA: Diagnosis not present

## 2016-08-24 ENCOUNTER — Telehealth: Payer: Self-pay | Admitting: Pulmonary Disease

## 2016-08-24 NOTE — Telephone Encounter (Signed)
LMTCB

## 2016-08-24 NOTE — Telephone Encounter (Signed)
Patient called back to clinic, contact # [phone].Charm Rings

## 2016-08-25 NOTE — Telephone Encounter (Signed)
Spoke with pt. She had a question about an antibiotic she was prescribed by another MD. Her question was answered. Nothing further was needed.

## 2016-08-26 ENCOUNTER — Ambulatory Visit: Payer: Medicare Other | Admitting: Adult Health

## 2016-09-01 ENCOUNTER — Encounter: Payer: Self-pay | Admitting: Adult Health

## 2016-09-01 ENCOUNTER — Ambulatory Visit (INDEPENDENT_AMBULATORY_CARE_PROVIDER_SITE_OTHER): Payer: Medicare Other | Admitting: Adult Health

## 2016-09-01 DIAGNOSIS — J441 Chronic obstructive pulmonary disease with (acute) exacerbation: Secondary | ICD-10-CM

## 2016-09-01 MED ORDER — PREDNISONE 10 MG PO TABS: ORAL_TABLET | ORAL | 0 refills | Status: DC

## 2016-09-01 MED FILL — predniSONE 10 MG TABS: 10 | 8 days supply | Qty: 20 | Fill #0

## 2016-09-01 NOTE — Progress Notes (Signed)
   Subjective:    Patient ID: [name], female    DOB: 1951-08-16, 65 y.o.   MRN: [id number]  HPI  65 yo female with COPD , smoker   09/01/2016 Follow up : COPD   Chief Complaint  Patient presents with  . Follow-up   pt returns for a ER follow up . Seen in ER on 11/22 for COPD flare  . Tx w/ Doxycyline . Chest x-ray w/ no acute dz. She is feeling some better but still has cough and wheezing .  Still smoking , discussed smoking cessation. Denies chest pain, orthopnea,edmea or fever. Gets winded easily .     Significant tests/ events  Cleda Daub 09/06/2012  FeV1 77%  FeV1/FVC 77%  Fef 25 75 49% Cleda Daub 09/10/2013 FeV1 64% FVC 72% Fef 25- 75  37%  FeV1/FVC 70%      Review of Systems neg for any significant sore throat, dysphagia, itching, sneezing, nasal congestion or excess/ purulent secretions, fever, chills, sweats, unintended wt loss, pleuritic or exertional cp, hempoptysis, orthopnea pnd or change in chronic leg swelling.   Also denies presyncope, palpitations, heartburn, abdominal pain, nausea, vomiting, diarrhea or change in bowel or urinary habits, dysuria,hematuria, rash, arthralgias, visual complaints, headache, numbness weakness or ataxia.     Objective:   Physical Exam Vitals:   09/01/16 1041  BP: 121/74  Pulse: 66  SpO2: 98%  Weight: 198 lb 9.6 oz (90.1 kg)  Height: 5\' 6"  (1.676 m)    Gen. Pleasant, well-nourished, in no distress ENT - no lesions, no post nasal drip Neck: No JVD, no thyromegaly, no carotid bruits Lungs: no use of accessory muscles, no dullness to percussion,Decreased without rales or rhonchi  Cardiovascular: Rhythm regular, heart sounds  normal, no murmurs or gallops, no peripheral edema Musculoskeletal: No deformities, no cyanosis or clubbing   Zionah Criswell NP-C  [facility]  09/01/16

## 2016-09-01 NOTE — Patient Instructions (Signed)
Continue on ANORO 1 puff daily  Prednisone taper over next week.  Mucinex DM Twice daily  As needed  Cough/congestion  Work on not smoking  Please contact office for sooner follow up if symptoms do not improve or worsen or seek emergency care  Follow up Dr. Vassie Loll  In 3 months and As needed

## 2016-09-02 NOTE — Assessment & Plan Note (Signed)
Slow to resolve flare in active smoker.  Smoking cessation is key  Plan  Patient Instructions  Continue on ANORO 1 puff daily  Prednisone taper over next week.  Mucinex DM Twice daily  As needed  Cough/congestion  Work on not smoking  Please contact office for sooner follow up if symptoms do not improve or worsen or seek emergency care  Follow up Dr. Vassie Loll  In 3 months and As needed

## 2016-09-27 DIAGNOSIS — Z298 Encounter for other specified prophylactic measures: Secondary | ICD-10-CM | POA: Diagnosis not present

## 2016-09-27 DIAGNOSIS — F17210 Nicotine dependence, cigarettes, uncomplicated: Secondary | ICD-10-CM | POA: Diagnosis not present

## 2016-09-27 DIAGNOSIS — J45909 Unspecified asthma, uncomplicated: Secondary | ICD-10-CM | POA: Diagnosis not present

## 2016-09-27 DIAGNOSIS — I10 Essential (primary) hypertension: Secondary | ICD-10-CM | POA: Diagnosis not present

## 2016-09-27 DIAGNOSIS — J449 Chronic obstructive pulmonary disease, unspecified: Secondary | ICD-10-CM | POA: Diagnosis not present

## 2016-10-01 NOTE — Progress Notes (Signed)
Reviewed & agree with plan  

## 2016-10-03 ENCOUNTER — Telehealth: Payer: Self-pay | Admitting: Pulmonary Disease

## 2016-10-03 MED ORDER — DOXYCYCLINE HYCLATE 100 MG PO TABS: 100.0000 mg | ORAL_TABLET | Freq: Two times a day (BID) | ORAL | 0 refills | Status: DC

## 2016-10-03 MED ORDER — PREDNISONE 10 MG PO TABS: ORAL_TABLET | ORAL | 0 refills | Status: DC

## 2016-10-03 NOTE — Telephone Encounter (Signed)
Prescribe doxy 100 mg bid for 7 days and pred taper starting at 40 mg. Reduce dose by 10 mg every 3 days

## 2016-10-03 NOTE — Telephone Encounter (Signed)
PM  Please Advise- Sick Message  This is a pt. Of RA. Pt. C/o coughing with yellow mucus, increase sob, wheezing, chest tightness, some congestion Denies fever. She wanted to know if something could be called in.    Allergies  Allergen Reactions  . Azithromycin Anaphylaxis and Swelling    Eyes swell  . Benazepril Anaphylaxis    Other reaction(s): SWELLING  . Cephalexin Anaphylaxis and Swelling  . Benadryl [Diphenhydramine Hcl] Nausea And Vomiting  . Nexium [Esomeprazole Magnesium] Palpitations  . Spiriva [Tiotropium Bromide Monohydrate] Palpitations

## 2016-10-03 NOTE — Telephone Encounter (Signed)
Rx sent to preferred pharmacy. Pt aware and voiced her understanding. Nothing further needed.  

## 2016-10-03 NOTE — Telephone Encounter (Signed)
lmtcb X1 

## 2016-10-28 DIAGNOSIS — S76012A Strain of muscle, fascia and tendon of left hip, initial encounter: Secondary | ICD-10-CM | POA: Diagnosis not present

## 2016-10-28 DIAGNOSIS — E785 Hyperlipidemia, unspecified: Secondary | ICD-10-CM | POA: Diagnosis not present

## 2016-10-28 DIAGNOSIS — K5900 Constipation, unspecified: Secondary | ICD-10-CM | POA: Diagnosis not present

## 2016-10-28 DIAGNOSIS — I10 Essential (primary) hypertension: Secondary | ICD-10-CM | POA: Diagnosis not present

## 2016-10-28 DIAGNOSIS — M25552 Pain in left hip: Secondary | ICD-10-CM | POA: Diagnosis not present

## 2016-10-28 DIAGNOSIS — R52 Pain, unspecified: Secondary | ICD-10-CM | POA: Diagnosis not present

## 2016-10-28 DIAGNOSIS — S39012A Strain of muscle, fascia and tendon of lower back, initial encounter: Secondary | ICD-10-CM | POA: Diagnosis not present

## 2016-10-28 DIAGNOSIS — F17210 Nicotine dependence, cigarettes, uncomplicated: Secondary | ICD-10-CM | POA: Diagnosis not present

## 2016-10-28 DIAGNOSIS — E119 Type 2 diabetes mellitus without complications: Secondary | ICD-10-CM | POA: Diagnosis not present

## 2016-10-28 DIAGNOSIS — Z780 Asymptomatic menopausal state: Secondary | ICD-10-CM | POA: Diagnosis not present

## 2016-10-28 DIAGNOSIS — R269 Unspecified abnormalities of gait and mobility: Secondary | ICD-10-CM | POA: Diagnosis not present

## 2016-10-29 DIAGNOSIS — M25552 Pain in left hip: Secondary | ICD-10-CM | POA: Diagnosis not present

## 2016-10-29 DIAGNOSIS — R52 Pain, unspecified: Secondary | ICD-10-CM | POA: Diagnosis not present

## 2016-11-01 DIAGNOSIS — I10 Essential (primary) hypertension: Secondary | ICD-10-CM | POA: Diagnosis not present

## 2016-11-01 DIAGNOSIS — F17200 Nicotine dependence, unspecified, uncomplicated: Secondary | ICD-10-CM | POA: Diagnosis not present

## 2016-11-01 DIAGNOSIS — J45909 Unspecified asthma, uncomplicated: Secondary | ICD-10-CM | POA: Diagnosis not present

## 2016-11-01 DIAGNOSIS — E1169 Type 2 diabetes mellitus with other specified complication: Secondary | ICD-10-CM | POA: Diagnosis not present

## 2016-11-01 DIAGNOSIS — J449 Chronic obstructive pulmonary disease, unspecified: Secondary | ICD-10-CM | POA: Diagnosis not present

## 2016-11-01 DIAGNOSIS — E669 Obesity, unspecified: Secondary | ICD-10-CM | POA: Diagnosis not present

## 2016-11-01 DIAGNOSIS — K5900 Constipation, unspecified: Secondary | ICD-10-CM | POA: Diagnosis not present

## 2016-11-01 DIAGNOSIS — K219 Gastro-esophageal reflux disease without esophagitis: Secondary | ICD-10-CM | POA: Diagnosis not present

## 2016-11-01 DIAGNOSIS — E782 Mixed hyperlipidemia: Secondary | ICD-10-CM | POA: Diagnosis not present

## 2016-11-01 DIAGNOSIS — Z1231 Encounter for screening mammogram for malignant neoplasm of breast: Secondary | ICD-10-CM | POA: Diagnosis not present

## 2016-11-02 DIAGNOSIS — M545 Low back pain: Secondary | ICD-10-CM | POA: Diagnosis not present

## 2016-11-02 DIAGNOSIS — G8929 Other chronic pain: Secondary | ICD-10-CM | POA: Diagnosis not present

## 2016-12-20 DIAGNOSIS — Z1231 Encounter for screening mammogram for malignant neoplasm of breast: Secondary | ICD-10-CM | POA: Diagnosis not present

## 2016-12-26 DIAGNOSIS — M791 Myalgia: Secondary | ICD-10-CM | POA: Diagnosis not present

## 2016-12-29 DIAGNOSIS — J45909 Unspecified asthma, uncomplicated: Secondary | ICD-10-CM | POA: Diagnosis not present

## 2016-12-29 DIAGNOSIS — E782 Mixed hyperlipidemia: Secondary | ICD-10-CM | POA: Diagnosis not present

## 2016-12-29 DIAGNOSIS — I10 Essential (primary) hypertension: Secondary | ICD-10-CM | POA: Diagnosis not present

## 2016-12-29 DIAGNOSIS — E669 Obesity, unspecified: Secondary | ICD-10-CM | POA: Diagnosis not present

## 2016-12-29 DIAGNOSIS — M2550 Pain in unspecified joint: Secondary | ICD-10-CM | POA: Diagnosis not present

## 2016-12-29 DIAGNOSIS — E1169 Type 2 diabetes mellitus with other specified complication: Secondary | ICD-10-CM | POA: Diagnosis not present

## 2016-12-29 DIAGNOSIS — J449 Chronic obstructive pulmonary disease, unspecified: Secondary | ICD-10-CM | POA: Diagnosis not present

## 2016-12-29 DIAGNOSIS — K219 Gastro-esophageal reflux disease without esophagitis: Secondary | ICD-10-CM | POA: Diagnosis not present

## 2017-02-21 DIAGNOSIS — M791 Myalgia: Secondary | ICD-10-CM | POA: Diagnosis not present

## 2017-03-20 DIAGNOSIS — M25551 Pain in right hip: Secondary | ICD-10-CM | POA: Diagnosis not present

## 2017-03-20 DIAGNOSIS — M1711 Unilateral primary osteoarthritis, right knee: Secondary | ICD-10-CM | POA: Diagnosis not present

## 2017-03-20 DIAGNOSIS — M25561 Pain in right knee: Secondary | ICD-10-CM | POA: Diagnosis not present

## 2017-05-25 ENCOUNTER — Ambulatory Visit (INDEPENDENT_AMBULATORY_CARE_PROVIDER_SITE_OTHER): Payer: Medicare Other | Admitting: Adult Health

## 2017-05-25 ENCOUNTER — Encounter: Payer: Self-pay | Admitting: Adult Health

## 2017-05-25 DIAGNOSIS — Z720 Tobacco use: Secondary | ICD-10-CM

## 2017-05-25 DIAGNOSIS — J449 Chronic obstructive pulmonary disease, unspecified: Secondary | ICD-10-CM | POA: Diagnosis not present

## 2017-05-25 NOTE — Assessment & Plan Note (Signed)
Cessation discussed 

## 2017-05-25 NOTE — Assessment & Plan Note (Signed)
Stable on present regimen  Smoking /tobacco cessation   Plan Patient Instructions  Check with your Primary Doctor to see if you have had a Pnuemovax vaccine .  Work on not smoking.  Continue on Advair 1 puff Twice daily  , rinse after use.  Follow up with Dr. Vassie Loll in 6 months and As needed

## 2017-05-25 NOTE — Patient Instructions (Signed)
Check with your Primary Doctor to see if you have had a Pnuemovax vaccine .  Work on not smoking.  Continue on Advair 1 puff Twice daily  , rinse after use.  Follow up with Dr. Vassie Loll in 6 months and As needed

## 2017-05-25 NOTE — Progress Notes (Signed)
@Patient  ID: , female    DOB: 09-30-1950, 66 y.o.   MRN: [id number]  Chief Complaint  Patient presents with  . Follow-up    COPD     Referring provider: [id number], MD  HPI: 66 yo female smoker followed for moderate COPD   TEST  66 09/06/2012 FeV1 77% FeV1/FVC 77% Fef 25 75 49% CXR 07/2016 neg   05/25/2017 Follow up ; COPD  Patient presents for 9 month follow up for COPD. Patient continues to smoke and chew tobacco. Smoking /tobacco cessation discussed. Patient says overall that her breathing has been doing ok . Feels she is doing good.  She denies a flare of cough or wheezing. She denies chest pain, hemoptysis, weight loss or edema.. She remains on Advair twice daily.   Prevnar vaccines are up-to-date.   Allergies  Allergen Reactions  . Azithromycin Anaphylaxis and Swelling    Eyes swell  . Benazepril Anaphylaxis    Other reaction(s): SWELLING  . Cephalexin Anaphylaxis and Swelling  . Benadryl [Diphenhydramine Hcl] Nausea And Vomiting  . Nexium [Esomeprazole Magnesium] Palpitations  . Spiriva [Tiotropium Bromide Monohydrate] Palpitations    Immunization History  Administered Date(s) Administered  . Influenza Split 06/26/2012, 08/19/2013, 07/05/2014, 07/26/2016  . Influenza,inj,Quad PF,6+ Mos 07/16/2015  . Pneumococcal Conjugate-13 01/22/2015    Past Medical History:  Diagnosis Date  . Arthritis   . Asthma   . COPD (chronic obstructive pulmonary disease) (HCC)   . Diabetes mellitus   . Heart murmur   . High cholesterol   . Hyperlipidemia   . Hypertension   . Kidney infection   . Kidney stone   . Reflux     Tobacco History: History  Smoking Status  . Current Every Day Smoker  . Packs/day: 0.25  . Years: 45.00  . Types: Cigarettes  Smokeless Tobacco  . Current User  . Types: Chew    Comment: Started smoking at age 28.  Currently smoking 2-3 cig per day   Ready to quit: Not Answered Counseling given: Not  Answered   Outpatient Encounter Prescriptions as of 05/25/2017  Medication Sig  . albuterol (PROVENTIL) (2.5 MG/3ML) 0.083% nebulizer solution Take 3 mLs (2.5 mg total) by nebulization every 6 (six) hours as needed for wheezing or shortness of breath.  05/27/2017 amLODipine (NORVASC) 10 MG tablet Take 10 mg by mouth daily.  Marland Kitchen aspirin EC 81 MG tablet Take 81 mg by mouth daily.   Marland Kitchen atorvastatin (LIPITOR) 20 MG tablet   . Fluticasone-Salmeterol (ADVAIR DISKUS) 500-50 MCG/DOSE AEPB Inhale 1 puff into the lungs 2 (two) times daily.   . meloxicam (MOBIC) 7.5 MG tablet Take 7.5 mg by mouth daily as needed.   . metFORMIN (GLUCOPHAGE) 500 MG tablet Take 500 mg by mouth 2 (two) times daily with a meal.    . pantoprazole (PROTONIX) 40 MG tablet Take 1 tablet by mouth daily.  . [DISCONTINUED] benzonatate (TESSALON) 100 MG capsule Take 1 capsule (100 mg total) by mouth 3 (three) times daily as needed for cough.  . [DISCONTINUED] doxycycline (VIBRA-TABS) 100 MG tablet Take 1 tablet (100 mg total) by mouth 2 (two) times daily.  . [DISCONTINUED] pravastatin (PRAVACHOL) 80 MG tablet Take 40 mg by mouth at bedtime.  . [DISCONTINUED] predniSONE (DELTASONE) 10 MG tablet 4 tabs for 2 days, then 3 tabs for 2 days, 2 tabs for 2 days, then 1 tab for 2 days, then stop  . [DISCONTINUED] predniSONE (DELTASONE) 10 MG tablet Take 4 tabs X  3 days, 3 tabs X 3 tabs, 2 tabs X 3 tabs, 1 tab X 3 days then stop  . [DISCONTINUED] umeclidinium-vilanterol (ANORO ELLIPTA) 62.5-25 MCG/INH AEPB Inhale 1 puff into the lungs daily.   No facility-administered encounter medications on file as of 05/25/2017.      Review of Systems  Constitutional:   No  weight loss, night sweats,  Fevers, chills,  +fatigue, or  lassitude.  HEENT:   No headaches,  Difficulty swallowing,  Tooth/dental problems, or  Sore throat,                No sneezing, itching, ear ache, nasal congestion, post nasal drip,   CV:  No chest pain,  Orthopnea, PND, swelling in  lower extremities, anasarca, dizziness, palpitations, syncope.   GI  No heartburn, indigestion, abdominal pain, nausea, vomiting, diarrhea, change in bowel habits, loss of appetite, bloody stools.   Resp: No shortness of breath with exertion or at rest.  No excess mucus, no productive cough,  No non-productive cough,  No coughing up of blood.  No change in color of mucus.  No wheezing.  No chest wall deformity  Skin: no rash or lesions.  GU: no dysuria, change in color of urine, no urgency or frequency.  No flank pain, no hematuria   MS:  +artrhtic pains in hands    Physical Exam  BP 120/80 (BP Location: Left Arm, Patient Position: Sitting, Cuff Size: Normal)   Pulse 63   Ht 5\' 6"  (1.676 m)   Wt 194 lb (88 kg)   SpO2 99%   BMI 31.31 kg/m   GEN: A/Ox3; pleasant , NAD , obese    HEENT:  [place]/AT,  EACs-clear, TMs-wnl, NOSE-clear, THROAT-clear, no lesions, no postnasal drip or exudate noted.   NECK:  Supple w/ fair ROM; no JVD; normal carotid impulses w/o bruits; no thyromegaly or nodules palpated; no lymphadenopathy.    RESP  Clear  P & A; w/o, wheezes/ rales/ or rhonchi. no accessory muscle use, no dullness to percussion  CARD:  RRR, no m/r/g, no peripheral edema, pulses intact, no cyanosis or clubbing.  GI:   Soft & nt; nml bowel sounds; no organomegaly or masses detected.   Musco: Warm bil, no deformities or joint swelling noted.   Neuro: alert, no focal deficits noted.    Skin: Warm, no lesions or rashes    Lab Results:  CBC  BMET  BNP No results found for: BNP  ProBNP No results found for: PROBNP  Imaging: No results found.   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  Work on not smoking.  Continue on Advair 1 puff Twice daily  ,  rinse after use.  Follow up with Dr. in 6 months and As needed       Tobacco abuse Cessation discussed      Vassie Loll, NP 05/25/2017

## 2017-07-10 DIAGNOSIS — Z23 Encounter for immunization: Secondary | ICD-10-CM | POA: Diagnosis not present

## 2017-07-10 DIAGNOSIS — Z0000 Encounter for general adult medical examination without abnormal findings: Secondary | ICD-10-CM | POA: Diagnosis not present

## 2017-07-13 ENCOUNTER — Other Ambulatory Visit (HOSPITAL_BASED_OUTPATIENT_CLINIC_OR_DEPARTMENT_OTHER): Payer: Self-pay | Admitting: Family Medicine

## 2017-07-13 DIAGNOSIS — Z1231 Encounter for screening mammogram for malignant neoplasm of breast: Secondary | ICD-10-CM

## 2017-08-01 DIAGNOSIS — R928 Other abnormal and inconclusive findings on diagnostic imaging of breast: Secondary | ICD-10-CM | POA: Diagnosis not present

## 2017-08-01 DIAGNOSIS — N644 Mastodynia: Secondary | ICD-10-CM | POA: Diagnosis not present

## 2017-12-11 DIAGNOSIS — K219 Gastro-esophageal reflux disease without esophagitis: Secondary | ICD-10-CM | POA: Diagnosis not present

## 2017-12-11 DIAGNOSIS — I10 Essential (primary) hypertension: Secondary | ICD-10-CM | POA: Diagnosis not present

## 2017-12-11 DIAGNOSIS — Z7251 High risk heterosexual behavior: Secondary | ICD-10-CM | POA: Diagnosis not present

## 2017-12-11 DIAGNOSIS — J45909 Unspecified asthma, uncomplicated: Secondary | ICD-10-CM | POA: Diagnosis not present

## 2017-12-11 DIAGNOSIS — Z9189 Other specified personal risk factors, not elsewhere classified: Secondary | ICD-10-CM | POA: Diagnosis not present

## 2017-12-11 DIAGNOSIS — E669 Obesity, unspecified: Secondary | ICD-10-CM | POA: Diagnosis not present

## 2017-12-11 DIAGNOSIS — E782 Mixed hyperlipidemia: Secondary | ICD-10-CM | POA: Diagnosis not present

## 2017-12-11 DIAGNOSIS — J449 Chronic obstructive pulmonary disease, unspecified: Secondary | ICD-10-CM | POA: Diagnosis not present

## 2017-12-11 DIAGNOSIS — F17200 Nicotine dependence, unspecified, uncomplicated: Secondary | ICD-10-CM | POA: Diagnosis not present

## 2017-12-11 DIAGNOSIS — R300 Dysuria: Secondary | ICD-10-CM | POA: Diagnosis not present

## 2017-12-11 DIAGNOSIS — E1169 Type 2 diabetes mellitus with other specified complication: Secondary | ICD-10-CM | POA: Diagnosis not present

## 2017-12-14 ENCOUNTER — Encounter: Payer: Self-pay | Admitting: Adult Health

## 2017-12-14 ENCOUNTER — Ambulatory Visit: Payer: Medicare Other | Admitting: Pulmonary Disease

## 2017-12-14 ENCOUNTER — Ambulatory Visit (INDEPENDENT_AMBULATORY_CARE_PROVIDER_SITE_OTHER): Payer: Medicare Other | Admitting: Adult Health

## 2017-12-14 ENCOUNTER — Ambulatory Visit (HOSPITAL_BASED_OUTPATIENT_CLINIC_OR_DEPARTMENT_OTHER)
Admission: RE | Admit: 2017-12-14 | Discharge: 2017-12-14 | Disposition: A | Discharge status: Home / Self Care | Payer: Medicare Other | Source: Ambulatory Visit | Attending: Adult Health | Admitting: Adult Health

## 2017-12-14 VITALS — BP 104/58 | HR 105 | Wt 192.0 lb

## 2017-12-14 DIAGNOSIS — R062 Wheezing: Secondary | ICD-10-CM | POA: Diagnosis not present

## 2017-12-14 DIAGNOSIS — J449 Chronic obstructive pulmonary disease, unspecified: Secondary | ICD-10-CM | POA: Diagnosis not present

## 2017-12-14 DIAGNOSIS — Z720 Tobacco use: Secondary | ICD-10-CM

## 2017-12-14 DIAGNOSIS — J441 Chronic obstructive pulmonary disease with (acute) exacerbation: Secondary | ICD-10-CM | POA: Diagnosis not present

## 2017-12-14 MED ORDER — BENZONATATE 200 MG PO CAPS: 200.0000 mg | ORAL_CAPSULE | Freq: Three times a day (TID) | ORAL | 1 refills | Status: DC | PRN

## 2017-12-14 MED ORDER — PREDNISONE 10 MG PO TABS: ORAL_TABLET | ORAL | 0 refills | Status: DC

## 2017-12-14 MED ORDER — DOXYCYCLINE HYCLATE 100 MG PO TABS: 100.0000 mg | ORAL_TABLET | Freq: Two times a day (BID) | ORAL | 0 refills | Status: DC

## 2017-12-14 MED FILL — DOXYCYCLINE HYCLATE 100 MG: 100 | 7 days supply | Qty: 14 | Fill #0

## 2017-12-14 MED FILL — predniSONE 10 MG TABS: 10 | 8 days supply | Qty: 20 | Fill #0

## 2017-12-14 MED FILL — BENZONATATE 200 MG CAP: 200 | 10 days supply | Qty: 30 | Fill #0

## 2017-12-14 NOTE — Assessment & Plan Note (Signed)
Exacerbation  Check cxr  smokign cessation discussed   Plan  Patient Instructions  Doxycycline 100mg  Twice daily  For 7 days , take with food.  Wear sunscreen as may cause you to sunburn while taking . Prednisone taper over next week.   Mucinex DM Twice daily  As needed  Cough/congestion  Tessalon Three times a day  As needed  Cough  Work on not smoking . May try nicotine patches-Nicoderm CQ step down is one option  Chest xray today .  Follow up with Dr.  In 6 months and As needed  -[place] office  Please contact office for sooner follow up if symptoms do not improve or worsen or seek emergency care

## 2017-12-14 NOTE — Patient Instructions (Addendum)
Doxycycline 100mg  Twice daily  For 7 days , take with food.  Wear sunscreen as may cause you to sunburn while taking . Prednisone taper over next week.   Mucinex DM Twice daily  As needed  Cough/congestion  Tessalon Three times a day  As needed  Cough  Work on not smoking . May try nicotine patches-Nicoderm CQ step down is one option  Chest xray today .  Follow up with Dr.  In 6 months and As needed  -[place] office  Please contact office for sooner follow up if symptoms do not improve or worsen or seek emergency care

## 2017-12-14 NOTE — Progress Notes (Signed)
@Patient  ID: , female    DOB: 1951/09/04, 67 y.o.   MRN: [id number]  Chief Complaint  Patient presents with  . Acute Visit    COPD     Referring provider: [id number], MD  HPI: 67 yo female smoker followed for moderate COPD   TEST  79 09/06/2012 FeV1 77% FeV1/FVC 77% Fef 25 75 49% CXR 07/2016 neg   12/14/2017 Acute OV : COPD  Pt presents for an acute office visit. Complains of 1 week of cough, congestion , wheezing and increased sob. No fever, chest pain, orthopnea, edema or fever.  Remains on Advair . Has used albuterol with some help.  No recent abx or steroid use.  Coughing up thick mucus . Hard to get up sometimes. Has nasal congestion and drainage .   Still smoking discussed smoking cessation .     Allergies  Allergen Reactions  . Azithromycin Anaphylaxis and Swelling    Eyes swell  . Benazepril Anaphylaxis    Other reaction(s): SWELLING  . Cephalexin Anaphylaxis and Swelling  . Benadryl [Diphenhydramine Hcl] Nausea And Vomiting  . Nexium [Esomeprazole Magnesium] Palpitations  . Spiriva [Tiotropium Bromide Monohydrate] Palpitations    Immunization History  Administered Date(s) Administered  . Influenza Split 06/26/2012, 08/19/2013, 07/05/2014, 07/26/2016  . Influenza,inj,Quad PF,6+ Mos 07/16/2015  . Pneumococcal Conjugate-13 01/22/2015    Past Medical History:  Diagnosis Date  . Arthritis   . Asthma   . COPD (chronic obstructive pulmonary disease) (HCC)   . Diabetes mellitus   . Heart murmur   . High cholesterol   . Hyperlipidemia   . Hypertension   . Kidney infection   . Kidney stone   . Reflux     Tobacco History: Social History   Tobacco Use  Smoking Status Current Every Day Smoker  . Packs/day: 0.25  . Years: 45.00  . Pack years: 11.25  . Types: Cigarettes  Smokeless Tobacco Current User  . Types: Chew  Tobacco Comment   Started smoking at age 23.  Currently smoking 2-3 cig per day   Ready to quit:  Yes Counseling given: Yes Comment: Started smoking at age 77.  Currently smoking 2-3 cig per day   Outpatient Encounter Medications as of 12/14/2017  Medication Sig  . albuterol (PROVENTIL) (2.5 MG/3ML) 0.083% nebulizer solution Take 3 mLs (2.5 mg total) by nebulization every 6 (six) hours as needed for wheezing or shortness of breath.  12/16/2017 amLODipine (NORVASC) 10 MG tablet Take 10 mg by mouth daily.  Marland Kitchen aspirin EC 81 MG tablet Take 81 mg by mouth daily.   Marland Kitchen atorvastatin (LIPITOR) 20 MG tablet   . benzonatate (TESSALON) 200 MG capsule Take 1 capsule (200 mg total) by mouth 3 (three) times daily as needed for cough.  . doxycycline (VIBRA-TABS) 100 MG tablet Take 1 tablet (100 mg total) by mouth 2 (two) times daily.  . Fluticasone-Salmeterol (ADVAIR DISKUS) 500-50 MCG/DOSE AEPB Inhale 1 puff into the lungs 2 (two) times daily.   . meloxicam (MOBIC) 7.5 MG tablet Take 7.5 mg by mouth daily as needed.   . metFORMIN (GLUCOPHAGE) 500 MG tablet Take 500 mg by mouth 2 (two) times daily with a meal.    . pantoprazole (PROTONIX) 40 MG tablet Take 1 tablet by mouth daily.  . predniSONE (DELTASONE) 10 MG tablet 4 tabs for 2 days, then 3 tabs for 2 days, 2 tabs for 2 days, then 1 tab for 2 days, then stop   No facility-administered encounter  medications on file as of 12/14/2017.      Review of Systems  Constitutional:   No  weight loss, night sweats,  Fevers, chills, fatigue, or  lassitude.  HEENT:   No headaches,  Difficulty swallowing,  Tooth/dental problems, or  Sore throat,                No sneezing, itching, ear ache, +nasal congestion, post nasal drip,   CV:  No chest pain,  Orthopnea, PND, swelling in lower extremities, anasarca, dizziness, palpitations, syncope.   GI  No heartburn, indigestion, abdominal pain, nausea, vomiting, diarrhea, change in bowel habits, loss of appetite, bloody stools.   Resp:   No chest wall deformity  Skin: no rash or lesions.  GU: no dysuria, change in color  of urine, no urgency or frequency.  No flank pain, no hematuria   MS:  No joint pain or swelling.  No decreased range of motion.  No back pain.    Physical Exam  BP (!) 104/58   Pulse (!) 105   Wt 192 lb (87.1 kg)   SpO2 94%   BMI 30.99 kg/m   GEN: A/Ox3; pleasant , NAD, elderly    HEENT:  [place]/AT,  EACs-clear, TMs-wnl, NOSE-clear, THROAT-clear, no lesions, no postnasal drip or exudate noted.   NECK:  Supple w/ fair ROM; no JVD; normal carotid impulses w/o bruits; no thyromegaly or nodules palpated; no lymphadenopathy.    RESP  Clear  P & A; w/o, wheezes/ rales/ or rhonchi. no accessory muscle use, no dullness to percussion  CARD:  RRR, no m/r/g, no peripheral edema, pulses intact, no cyanosis or clubbing.  GI:   Soft & nt; nml bowel sounds; no organomegaly or masses detected.   Musco: Warm bil, no deformities or joint swelling noted.   Neuro: alert, no focal deficits noted.    Skin: Warm, no lesions or rashes    Lab Results:  CBC    BNP No results found for: BNP  ProBNP No results found for: PROBNP  Imaging: No results found.   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  Wear sunscreen as may cause you to sunburn while taking . Prednisone taper over next week.   Mucinex DM Twice daily  As needed  Cough/congestion  Tessalon Three times a day  As needed  Cough  Work on not smoking . May try nicotine patches-Nicoderm CQ step down is one option  Chest xray today .  Follow up with Dr.  In 6 months and As needed  -[place] office  Please contact office for sooner follow up if symptoms do not improve or worsen or seek emergency care       Tobacco abuse Smoking cessation     Craige Cotta, NP 12/14/2017

## 2017-12-14 NOTE — Assessment & Plan Note (Signed)
Smoking cessation  

## 2017-12-14 NOTE — Progress Notes (Signed)
Reviewed and agree with assessment/plan.   Adaya Garmany, MD [facility] 09/21/2016, 12:24 PM Pager:  [phone]  

## 2017-12-15 DIAGNOSIS — E782 Mixed hyperlipidemia: Secondary | ICD-10-CM | POA: Diagnosis not present

## 2017-12-15 DIAGNOSIS — R300 Dysuria: Secondary | ICD-10-CM | POA: Diagnosis not present

## 2017-12-15 DIAGNOSIS — Z7251 High risk heterosexual behavior: Secondary | ICD-10-CM | POA: Diagnosis not present

## 2017-12-21 DIAGNOSIS — E119 Type 2 diabetes mellitus without complications: Secondary | ICD-10-CM | POA: Diagnosis not present

## 2017-12-21 DIAGNOSIS — Z961 Presence of intraocular lens: Secondary | ICD-10-CM | POA: Diagnosis not present

## 2017-12-21 DIAGNOSIS — H524 Presbyopia: Secondary | ICD-10-CM | POA: Diagnosis not present

## 2017-12-22 DIAGNOSIS — Z1231 Encounter for screening mammogram for malignant neoplasm of breast: Secondary | ICD-10-CM | POA: Diagnosis not present

## 2018-01-17 DIAGNOSIS — R102 Pelvic and perineal pain: Secondary | ICD-10-CM | POA: Diagnosis not present

## 2018-01-17 DIAGNOSIS — R399 Unspecified symptoms and signs involving the genitourinary system: Secondary | ICD-10-CM | POA: Diagnosis not present

## 2018-01-19 ENCOUNTER — Telehealth: Payer: Self-pay | Admitting: Pulmonary Disease

## 2018-01-19 NOTE — Telephone Encounter (Signed)
Antibiotic can cause gastritis but that should not last for this long   may have to consider other causes or other medications

## 2018-01-19 NOTE — Telephone Encounter (Signed)
RA please advise on recommendations for patient- she would like to know how to proceed.  Thanks.

## 2018-01-19 NOTE — Telephone Encounter (Signed)
Please have her make appointment with her PCP

## 2018-01-19 NOTE — Telephone Encounter (Signed)
Spoke with pt, she states she thinks the pain in her stomach pain is coming from the medications that were given but she has completed them as of now. She was given doxycycline on 12/14/2017. She states the package states abdominal pain can still occur after completion. I advised her that it was hard to evaluate abdominal pain over the phone. She would like to get advice on what she should do about the pain. Please advise Dr. Vassie Loll.   Patient Instructions  Doxycycline 100mg  Twice daily  For 7 days , take with food.  Wear sunscreen as may cause you to sunburn while taking . Prednisone taper over next week.   Mucinex DM Twice daily  As needed  Cough/congestion  Tessalon Three times a day  As needed  Cough  Work on not smoking . May try nicotine patches-Nicoderm CQ step down is one option  Chest xray today .  Follow up with Dr.  In 6 months and As needed  -[place] office  Please contact office for sooner follow up if symptoms do not improve or worsen or seek emergency care

## 2018-01-19 NOTE — Telephone Encounter (Signed)
Pt aware of recs.  Nothing further needed. 

## 2018-02-21 DIAGNOSIS — R51 Headache: Secondary | ICD-10-CM | POA: Diagnosis not present

## 2018-04-02 ENCOUNTER — Ambulatory Visit: Payer: Medicare Other | Admitting: Pulmonary Disease

## 2018-04-05 ENCOUNTER — Telehealth: Payer: Self-pay | Admitting: Pulmonary Disease

## 2018-04-05 NOTE — Telephone Encounter (Signed)
Attempted to contact pt. I did not receive an answer. There was no option for me to leave a message. Will try back.  

## 2018-04-09 ENCOUNTER — Ambulatory Visit: Payer: Medicare Other | Admitting: Adult Health

## 2018-04-09 ENCOUNTER — Ambulatory Visit: Payer: Medicare Other | Admitting: Primary Care

## 2018-04-09 NOTE — Telephone Encounter (Signed)
Attempted to call pt but no answer. Left message for pt to return call. Pt is scheduled for an acute visit today, 7/15 with TP at 4:30. If pt does not call back before visit, we can see what pt was needing at that time.

## 2018-04-10 NOTE — Telephone Encounter (Signed)
Spoke with pt. She states that she will discuss what she needs when she sees TP on 04/12/18 in [place].

## 2018-04-11 DIAGNOSIS — J45909 Unspecified asthma, uncomplicated: Secondary | ICD-10-CM | POA: Diagnosis not present

## 2018-04-11 DIAGNOSIS — E782 Mixed hyperlipidemia: Secondary | ICD-10-CM | POA: Diagnosis not present

## 2018-04-11 DIAGNOSIS — M2550 Pain in unspecified joint: Secondary | ICD-10-CM | POA: Diagnosis not present

## 2018-04-11 DIAGNOSIS — E669 Obesity, unspecified: Secondary | ICD-10-CM | POA: Diagnosis not present

## 2018-04-11 DIAGNOSIS — E1169 Type 2 diabetes mellitus with other specified complication: Secondary | ICD-10-CM | POA: Diagnosis not present

## 2018-04-11 DIAGNOSIS — F17200 Nicotine dependence, unspecified, uncomplicated: Secondary | ICD-10-CM | POA: Diagnosis not present

## 2018-04-11 DIAGNOSIS — K219 Gastro-esophageal reflux disease without esophagitis: Secondary | ICD-10-CM | POA: Diagnosis not present

## 2018-04-11 DIAGNOSIS — I10 Essential (primary) hypertension: Secondary | ICD-10-CM | POA: Diagnosis not present

## 2018-04-12 ENCOUNTER — Ambulatory Visit (INDEPENDENT_AMBULATORY_CARE_PROVIDER_SITE_OTHER): Payer: Medicare Other | Admitting: Adult Health

## 2018-04-12 ENCOUNTER — Encounter: Payer: Self-pay | Admitting: Adult Health

## 2018-04-12 DIAGNOSIS — J441 Chronic obstructive pulmonary disease with (acute) exacerbation: Secondary | ICD-10-CM

## 2018-04-12 DIAGNOSIS — Z720 Tobacco use: Secondary | ICD-10-CM | POA: Diagnosis not present

## 2018-04-12 MED ORDER — FLUTICASONE-SALMETEROL 500-50 MCG/DOSE IN AEPB: 1.0000 | INHALATION_SPRAY | Freq: Two times a day (BID) | RESPIRATORY_TRACT | 3 refills | Status: DC

## 2018-04-12 MED ORDER — PREDNISONE 10 MG PO TABS: ORAL_TABLET | ORAL | 0 refills | Status: DC

## 2018-04-12 MED FILL — ADVAIR 500/50 DISKUS: 500-50 | 30 days supply | Qty: 60 | Fill #0

## 2018-04-12 MED FILL — predniSONE 10 MG TABS: 10 | 8 days supply | Qty: 20 | Fill #0

## 2018-04-12 NOTE — Assessment & Plan Note (Signed)
Smoking cessation  

## 2018-04-12 NOTE — Assessment & Plan Note (Signed)
Mild flare with ongoing smoking - smoking cessation is key  Hold on abx at this time  Advair compliance discussed   Plan  Patient Instructions  Restart Advair 1 puff Twice daily  , rinse well after use.  Prednisone taper over next week.   Mucinex DM Twice daily  As needed  Cough/congestion  Tessalon Three times a day  As needed  Cough  Work on not smoking . May try nicotine patches-Nicoderm CQ step down is one option  Follow up with Dr. Craige Cotta  In 6 months and As needed  -[place] office  Please contact office for sooner follow up if symptoms do not improve or worsen or seek emergency care

## 2018-04-12 NOTE — Patient Instructions (Addendum)
Restart Advair 1 puff Twice daily  , rinse well after use.  Prednisone taper over next week.   Mucinex DM Twice daily  As needed  Cough/congestion  Tessalon Three times a day  As needed  Cough  Work on not smoking . May try nicotine patches-Nicoderm CQ step down is one option  Follow up with Dr. Craige Cotta  In 6 months and As needed  -[place] office  Please contact office for sooner follow up if symptoms do not improve or worsen or seek emergency care

## 2018-04-12 NOTE — Progress Notes (Signed)
@Patient  ID: , female    DOB: June 17, 1951, 67 y.o.   MRN: [id number]  Chief Complaint  Patient presents with  . Follow-up    COPD     Referring provider: [id number], MD  HPI: 67 yo female smoker followed for moderate COPD   TEST  76 09/06/2012 FeV1 77% FeV1/FVC 77% Fef 25 75 49% CXR 07/2016 neg  04/12/2018 Acute OV : COPD  Pt presents for an acute office visit . Complains of 2 weeks of increased cough , congestion with clear to  yellow mucus , wheezing and increased dyspnea. Yellow mucus is clearing . Using tessalon which helps some .  She continues to smoke , discussed cessation . Has cut back on smoking some .  Says she has not been taking Advair on regular basis . We discussed compliance .  Has multiple drug allergies . Esp to antibiotics.  Has tried spiriva and ANORO in past with intolerances.  CXR in  March 2019 was clear .     Allergies  Allergen Reactions  . Azithromycin Anaphylaxis and Swelling    Eyes swell  . Benazepril Anaphylaxis    Other reaction(s): SWELLING  . Cephalexin Anaphylaxis and Swelling  . Benadryl [Diphenhydramine Hcl] Nausea And Vomiting  . Nexium [Esomeprazole Magnesium] Palpitations  . Spiriva [Tiotropium Bromide Monohydrate] Palpitations    Immunization History  Administered Date(s) Administered  . Influenza Split 06/26/2012, 08/19/2013, 07/05/2014, 07/26/2016  . Influenza,inj,Quad PF,6+ Mos 07/16/2015  . Pneumococcal Conjugate-13 01/22/2015    Past Medical History:  Diagnosis Date  . Arthritis   . Asthma   . COPD (chronic obstructive pulmonary disease) (HCC)   . Diabetes mellitus   . Heart murmur   . High cholesterol   . Hyperlipidemia   . Hypertension   . Kidney infection   . Kidney stone   . Reflux     Tobacco History: Social History   Tobacco Use  Smoking Status Current Every Day Smoker  . Packs/day: 0.25  . Years: 45.00  . Pack years: 11.25  . Types: Cigarettes  Smokeless Tobacco  Current User  . Types: Chew  Tobacco Comment   Started smoking at age 34.  Currently smoking 2-3 cig per day   Ready to quit: No Counseling given: Yes Comment: Started smoking at age 80.  Currently smoking 2-3 cig per day   Outpatient Medications Prior to Visit  Medication Sig Dispense Refill  . albuterol (PROVENTIL) (2.5 MG/3ML) 0.083% nebulizer solution Take 3 mLs (2.5 mg total) by nebulization every 6 (six) hours as needed for wheezing or shortness of breath. 75 mL 12  . amLODipine (NORVASC) 10 MG tablet Take 10 mg by mouth daily.    18 aspirin EC 81 MG tablet Take 81 mg by mouth daily.     Marland Kitchen atorvastatin (LIPITOR) 20 MG tablet     . benzonatate (TESSALON) 200 MG capsule Take 1 capsule (200 mg total) by mouth 3 (three) times daily as needed for cough. 30 capsule 1  . doxycycline (VIBRA-TABS) 100 MG tablet Take 1 tablet (100 mg total) by mouth 2 (two) times daily. 14 tablet 0  . meloxicam (MOBIC) 7.5 MG tablet Take 7.5 mg by mouth daily as needed.     . metFORMIN (GLUCOPHAGE) 500 MG tablet Take 500 mg by mouth 2 (two) times daily with a meal.      . pantoprazole (PROTONIX) 40 MG tablet Take 1 tablet by mouth daily.    . Fluticasone-Salmeterol (ADVAIR DISKUS) 500-50  MCG/DOSE AEPB Inhale 1 puff into the lungs 2 (two) times daily.     . predniSONE (DELTASONE) 10 MG tablet 4 tabs for 2 days, then 3 tabs for 2 days, 2 tabs for 2 days, then 1 tab for 2 days, then stop 20 tablet 0   No facility-administered medications prior to visit.      Review of Systems  Constitutional:   No  weight loss, night sweats,  Fevers, chills,  +fatigue, or  lassitude.  HEENT:   No headaches,  Difficulty swallowing,  Tooth/dental problems, or  Sore throat,                No sneezing, itching, ear ache,  +nasal congestion, post nasal drip,   CV:  No chest pain,  Orthopnea, PND, swelling in lower extremities, anasarca, dizziness, palpitations, syncope.   GI  No heartburn, indigestion, abdominal pain,  nausea, vomiting, diarrhea, change in bowel habits, loss of appetite, bloody stools.   Resp: .  No chest wall deformity  Skin: no rash or lesions.  GU: no dysuria, change in color of urine, no urgency or frequency.  No flank pain, no hematuria   MS:  No joint pain or swelling.  No decreased range of motion.  No back pain.    Physical Exam  BP 130/70 (BP Location: Left Arm, Cuff Size: Normal)   Pulse 69   Ht 5\' 4"  (1.626 m)   Wt 192 lb (87.1 kg)   SpO2 98%   BMI 32.96 kg/m   GEN: A/Ox3; pleasant , NAD,  Obese    HEENT:  [place]/AT,  EACs-clear, TMs-wnl, NOSE-clear, THROAT-clear, no lesions, no postnasal drip or exudate noted.   NECK:  Supple w/ fair ROM; no JVD; normal carotid impulses w/o bruits; no thyromegaly or nodules palpated; no lymphadenopathy.    RESP  Trace exp wheeze on forced expiration . i. no accessory muscle use, no dullness to percussion  CARD:  RRR, no m/r/g, no peripheral edema, pulses intact, no cyanosis or clubbing.  GI:   Soft & nt; nml bowel sounds; no organomegaly or masses detected.   Musco: Warm bil, no deformities or joint swelling noted.   Neuro: alert, no focal deficits noted.    Skin: Warm, no lesions or rashes    Lab Results:  CBC   BMET   BNP No results found for: BNP  ProBNP No results found for: PROBNP  Imaging: No results found.   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  Prednisone taper over next week.   Mucinex DM Twice daily  As needed  Cough/congestion  Tessalon Three times a day  As needed  Cough  Work on not smoking . May try nicotine patches-Nicoderm CQ step down is one option  Follow up with Dr.  In 6 months and As needed  -[place] office   Please contact office for sooner follow up if symptoms do not improve or worsen or seek emergency care       Tobacco abuse Smoking cessation      Craige Cotta, NP 04/12/2018

## 2018-04-13 NOTE — Progress Notes (Signed)
Reviewed and agree with assessment/plan.   Bridgid Printz, MD [facility] 09/21/2016, 12:24 PM Pager:  [phone]  

## 2018-05-10 IMAGING — CR DG KNEE 1-2V*R*
2 series · 2 of 2 positions shown · non-contrast
Comparison: 08/13/2015.

CLINICAL DATA: Right knee pain.

EXAM:
RIGHT KNEE - 1-2 VIEW

[w knee ap right *]
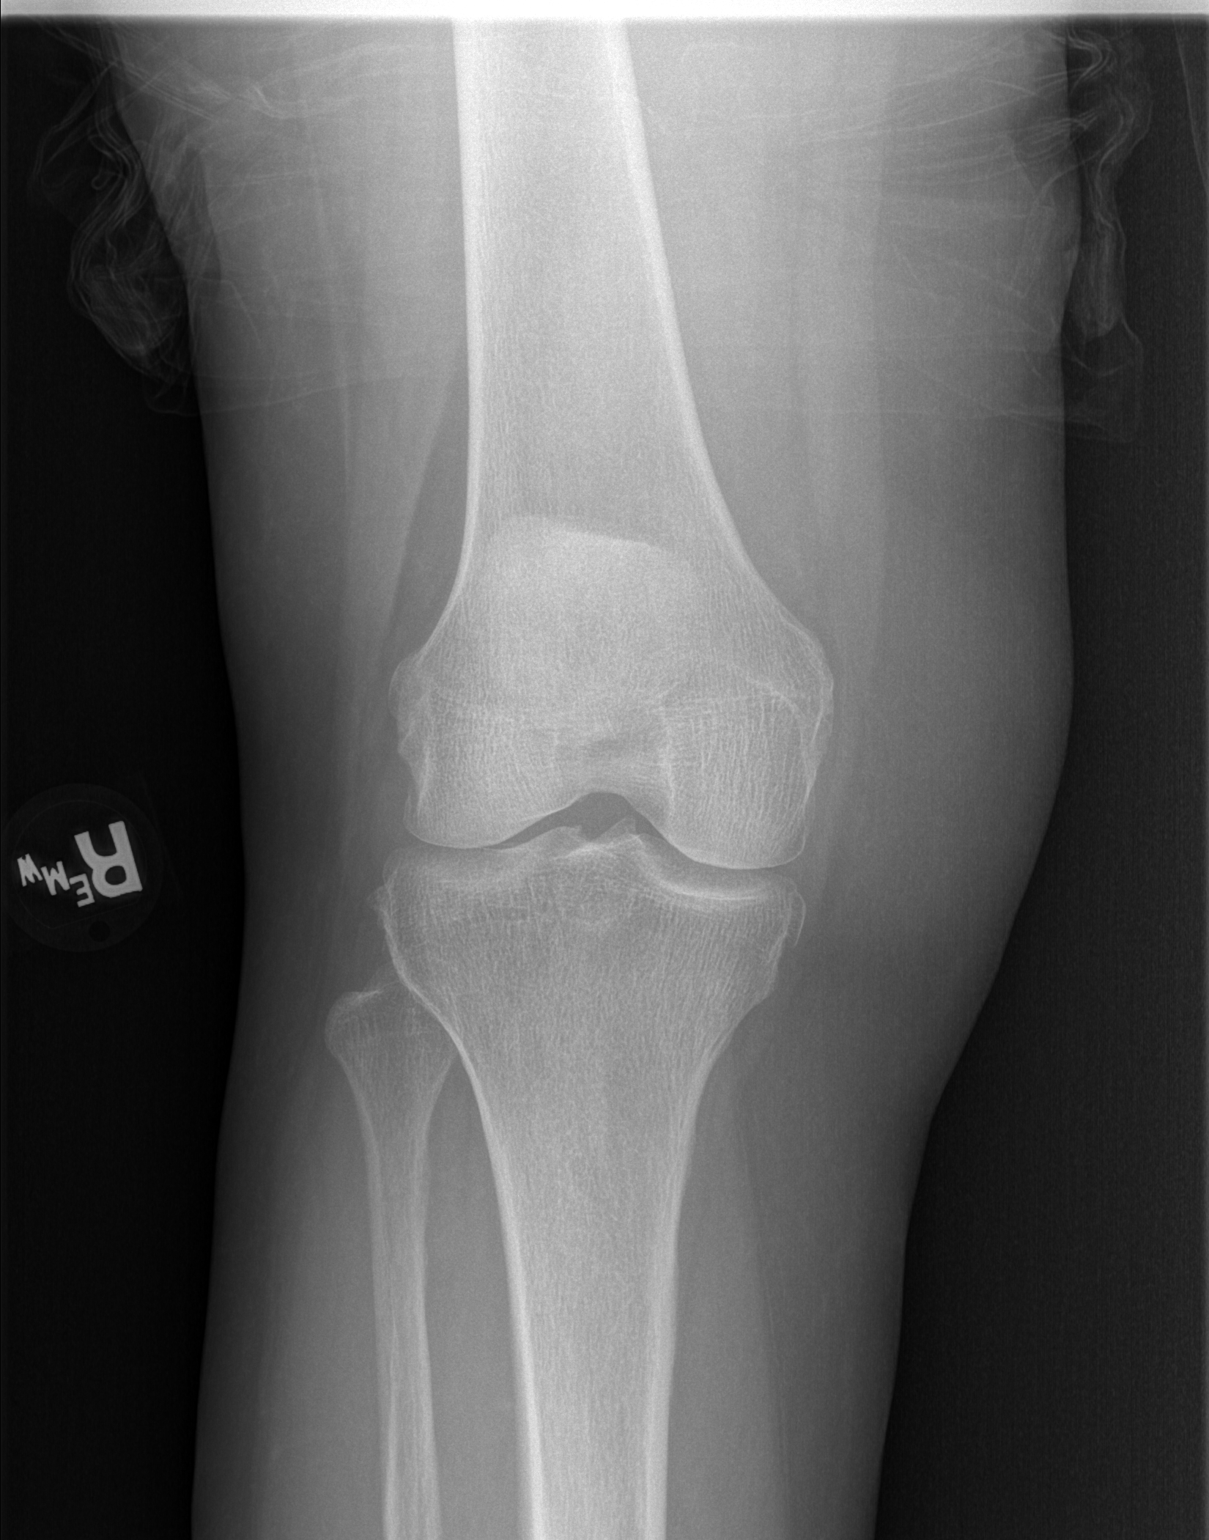

[w knee lat. right *]
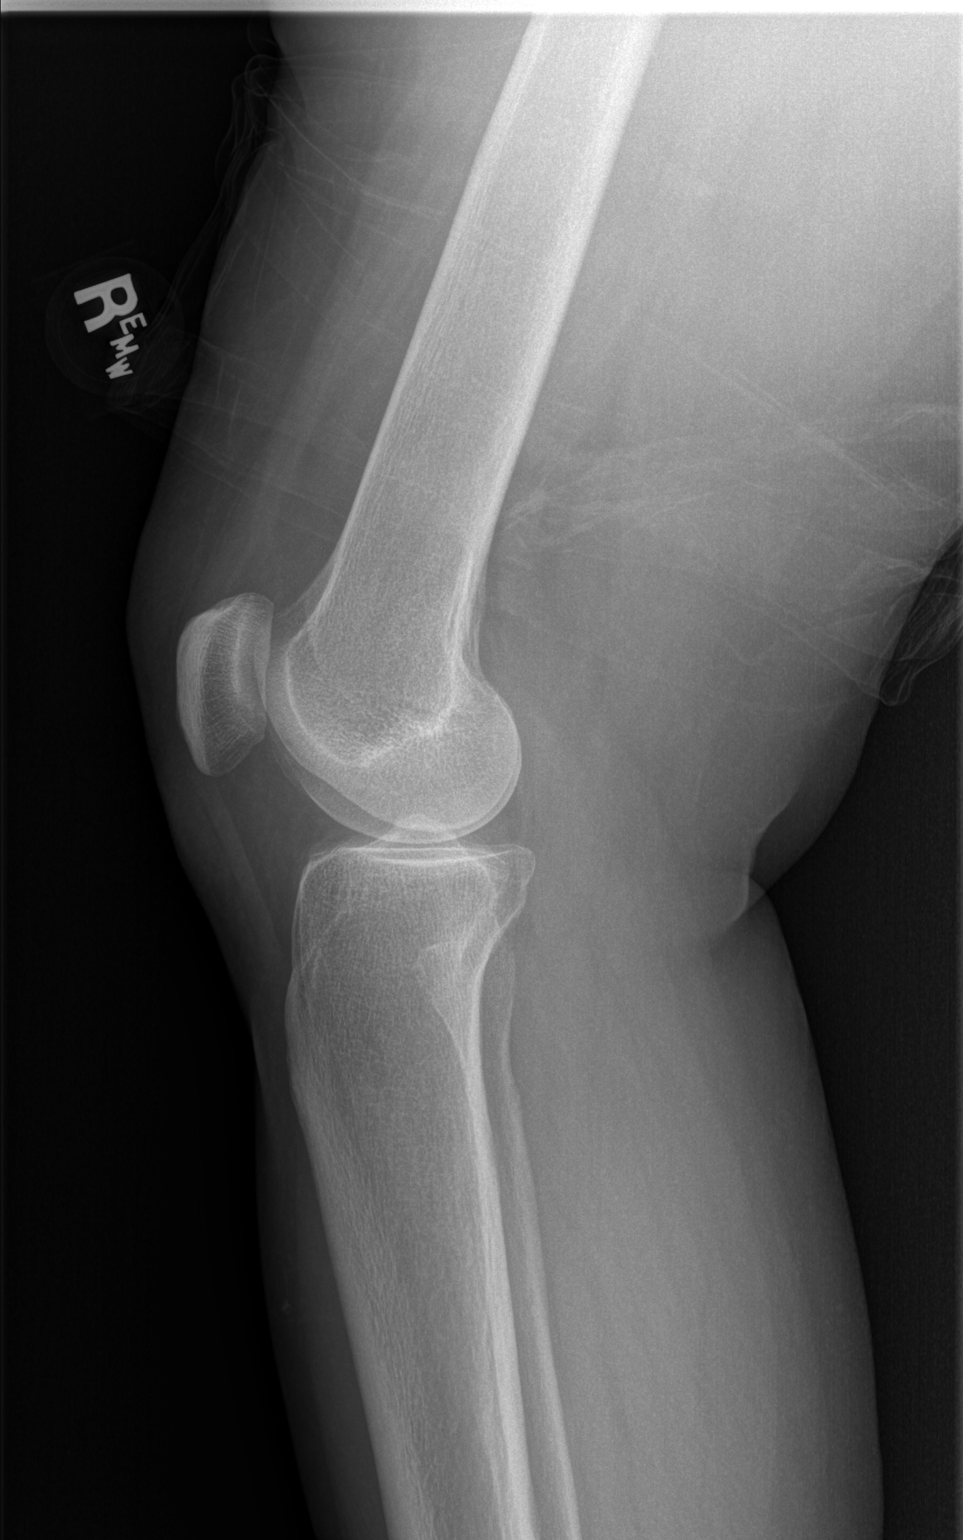

[2 of 2 positions shown; findings below may reference images not displayed]

FINDINGS: No acute bony or joint abnormality identified. Mild tricompartment
degenerative change.
IMPRESSION: No acute or focal abnormality. Mild tricompartment degenerative
change.

## 2018-06-01 DIAGNOSIS — M19071 Primary osteoarthritis, right ankle and foot: Secondary | ICD-10-CM | POA: Diagnosis not present

## 2018-06-01 DIAGNOSIS — M79675 Pain in left toe(s): Secondary | ICD-10-CM | POA: Diagnosis not present

## 2018-06-01 DIAGNOSIS — B351 Tinea unguium: Secondary | ICD-10-CM | POA: Diagnosis not present

## 2018-06-01 DIAGNOSIS — E1151 Type 2 diabetes mellitus with diabetic peripheral angiopathy without gangrene: Secondary | ICD-10-CM | POA: Diagnosis not present

## 2018-06-01 DIAGNOSIS — I739 Peripheral vascular disease, unspecified: Secondary | ICD-10-CM | POA: Diagnosis not present

## 2018-06-01 DIAGNOSIS — M79671 Pain in right foot: Secondary | ICD-10-CM | POA: Diagnosis not present

## 2018-06-01 DIAGNOSIS — M2032 Hallux varus (acquired), left foot: Secondary | ICD-10-CM | POA: Diagnosis not present

## 2018-06-01 DIAGNOSIS — M19072 Primary osteoarthritis, left ankle and foot: Secondary | ICD-10-CM | POA: Diagnosis not present

## 2018-06-01 DIAGNOSIS — M2041 Other hammer toe(s) (acquired), right foot: Secondary | ICD-10-CM | POA: Diagnosis not present

## 2018-06-07 ENCOUNTER — Telehealth: Payer: Self-pay | Admitting: Pulmonary Disease

## 2018-06-07 NOTE — Telephone Encounter (Signed)
Called and spoke with patient today regarding previous message Patient advised she would like to come in office for flu shot only Scheduled appt for pt for 06/13/18 at 10:15am in injection room Patient verbalized understanding, and had no further questions nor concerns today. Nothing further needed at this time.

## 2018-06-11 DIAGNOSIS — E782 Mixed hyperlipidemia: Secondary | ICD-10-CM | POA: Diagnosis not present

## 2018-06-11 DIAGNOSIS — J449 Chronic obstructive pulmonary disease, unspecified: Secondary | ICD-10-CM | POA: Diagnosis not present

## 2018-06-11 DIAGNOSIS — K219 Gastro-esophageal reflux disease without esophagitis: Secondary | ICD-10-CM | POA: Diagnosis not present

## 2018-06-11 DIAGNOSIS — R110 Nausea: Secondary | ICD-10-CM | POA: Diagnosis not present

## 2018-06-11 DIAGNOSIS — Z87891 Personal history of nicotine dependence: Secondary | ICD-10-CM | POA: Diagnosis not present

## 2018-06-11 DIAGNOSIS — G8929 Other chronic pain: Secondary | ICD-10-CM | POA: Diagnosis not present

## 2018-06-11 DIAGNOSIS — Z7984 Long term (current) use of oral hypoglycemic drugs: Secondary | ICD-10-CM | POA: Diagnosis not present

## 2018-06-11 DIAGNOSIS — E1169 Type 2 diabetes mellitus with other specified complication: Secondary | ICD-10-CM | POA: Diagnosis not present

## 2018-06-11 DIAGNOSIS — M19071 Primary osteoarthritis, right ankle and foot: Secondary | ICD-10-CM | POA: Diagnosis not present

## 2018-06-11 DIAGNOSIS — M25571 Pain in right ankle and joints of right foot: Secondary | ICD-10-CM | POA: Diagnosis not present

## 2018-06-11 DIAGNOSIS — I10 Essential (primary) hypertension: Secondary | ICD-10-CM | POA: Diagnosis not present

## 2018-06-11 DIAGNOSIS — E669 Obesity, unspecified: Secondary | ICD-10-CM | POA: Diagnosis not present

## 2018-06-11 DIAGNOSIS — Z7951 Long term (current) use of inhaled steroids: Secondary | ICD-10-CM | POA: Diagnosis not present

## 2018-06-13 ENCOUNTER — Ambulatory Visit: Payer: Medicare Other

## 2018-06-15 DIAGNOSIS — M722 Plantar fascial fibromatosis: Secondary | ICD-10-CM | POA: Diagnosis not present

## 2018-06-15 DIAGNOSIS — I739 Peripheral vascular disease, unspecified: Secondary | ICD-10-CM | POA: Diagnosis not present

## 2018-06-15 DIAGNOSIS — M2041 Other hammer toe(s) (acquired), right foot: Secondary | ICD-10-CM | POA: Diagnosis not present

## 2018-06-15 DIAGNOSIS — M79675 Pain in left toe(s): Secondary | ICD-10-CM | POA: Diagnosis not present

## 2018-06-15 DIAGNOSIS — B351 Tinea unguium: Secondary | ICD-10-CM | POA: Diagnosis not present

## 2018-06-15 DIAGNOSIS — E1151 Type 2 diabetes mellitus with diabetic peripheral angiopathy without gangrene: Secondary | ICD-10-CM | POA: Diagnosis not present

## 2018-06-25 ENCOUNTER — Encounter: Payer: Self-pay | Admitting: Adult Health

## 2018-06-25 ENCOUNTER — Ambulatory Visit (INDEPENDENT_AMBULATORY_CARE_PROVIDER_SITE_OTHER): Payer: Medicare Other | Admitting: Adult Health

## 2018-06-25 DIAGNOSIS — K219 Gastro-esophageal reflux disease without esophagitis: Secondary | ICD-10-CM | POA: Diagnosis not present

## 2018-06-25 DIAGNOSIS — J449 Chronic obstructive pulmonary disease, unspecified: Secondary | ICD-10-CM

## 2018-06-25 DIAGNOSIS — M1990 Unspecified osteoarthritis, unspecified site: Secondary | ICD-10-CM | POA: Diagnosis not present

## 2018-06-25 MED ORDER — NAPROXEN 500 MG PO TABS: 500.0000 mg | ORAL_TABLET | Freq: Two times a day (BID) | ORAL | 0 refills | Status: DC

## 2018-06-25 MED ORDER — RANITIDINE HCL 150 MG PO TABS: 150.0000 mg | ORAL_TABLET | Freq: Two times a day (BID) | ORAL | 3 refills | Status: DC

## 2018-06-25 MED FILL — NAPROXEN 500 MG TABLET: 500 | 7 days supply | Qty: 14 | Fill #0

## 2018-06-25 MED FILL — raNITIdine HCL 150 MG TABS: 150 | 30 days supply | Qty: 60 | Fill #0

## 2018-06-25 NOTE — Assessment & Plan Note (Addendum)
Appears stable  Smoking cessation  Check Spirometry on return .   Plan  Cont on Advair Twice daily  , oral inhaler care  Smoking cessation  Get flu shot next week .

## 2018-06-25 NOTE — Assessment & Plan Note (Signed)
Suspect Right shoulder strain vs tendonitis .  Trial of brief NSAIDs , ice and heat  If not improving see PCP or Ortho (has appointment with both) . Appears to be having lots of polyarthralgia may need further workup to r/o RA , etc.  No obvious joint destruction on exam .   Plan  Naproxen ( pt prefers as worked in past) x 1 week  Ice and heat Please contact office for sooner follow up if symptoms do not improve or worsen or seek emergency care

## 2018-06-25 NOTE — Patient Instructions (Addendum)
Begin Naproxen 500mg  Twice daily  , take with food for 1 week  Alternate ice and heat to shoulder As needed   Increase Ranitidine 150mg  Twice daily  .  GERD diet .  Continue on Advair 1 puff Twice daily  , rinse after use .  Work on getting off the tobacco products .  Get flu shot as discussed .  Follow up with PCP and Orthopedist this week as planned.  Follow up with Dr. or Dewane Timson in 3 months at [facility] office and As needed  (will get Spirometry that day ) Please contact office for sooner follow up if symptoms do not improve or worsen or seek emergency care

## 2018-06-25 NOTE — Assessment & Plan Note (Signed)
GERD - increase Zantac Twice daily   GERD diet  If not better may need to change back to PPI and or refer to GI .  follow up with PCP this week with labs.

## 2018-06-25 NOTE — Progress Notes (Signed)
@Patient  ID: [name], female    DOB: February 27, 1951, 67 y.o.   MRN: [id number]  Chief Complaint  Patient presents with  . Acute Visit    right shoulder pain     Referring provider: Bernerd Limbo, MD  HPI: 67 yo female active smoker (cigs and dip )  followed for moderate COPD  Medical hx significant for HTN, hyperlipidemia and diabetes  TEST Cleda Daub 09/06/2012 FeV1 77% FeV1/FVC 77% Fef 25 75 49% CXR 07/2016, 11/2017  neg Prevnar 2016   06/25/2018 Acute OV : Right Shoulder pain  Patient presents for an acute office visit.  Patient complains over the last 3 days that she has had right shoulder and neck pain.  Patient says it sore to touch along the upper part of her shoulder and neck area.  It is painful when she raises up her arm or flexes or extends her neck.  Patient denies any known injury or fall.  She is not taking any medications for treatment.  Patient denies any arm weakness, loss of grip, rash, chest pain or radicular symptoms.  No paresthesias.  Patient says she does have quite a bit of joint problems.  She says she is been seeing an orthopedist and has an appointment later this week.  She says she has ankle and knee problems.  Says that she has used naproxen in the past which has helped but she does not have any more at this time.  Says she took Mobic in the past but did not like the way it made her feel  Patient has underlying COPD.  She continues to smoke.  And use tobacco dip.  She was counseled on cessation she remains on Advair twice daily.  Says she has had no flare of cough or wheezing.  She had no recent albuterol use.  Says that she has occasional heartburn and upset stomach. Worse after eating foods , esp spicy .  Previously was on Protonix but is no longer taking.  Says she is on ranitidine once daily.  She denies any trouble swallowing, vomiting diarrhea bloody stools. does have gas and bloating. She says she has a physical later this week with her primary care  physician.  Says she is supposed to get labs at this visit.    She denies any chest pain orthopnea PND leg swelling presyncope syncope or rash.    Allergies  Allergen Reactions  . Azithromycin Anaphylaxis and Swelling    Eyes swell  . Benazepril Anaphylaxis    Other reaction(s): SWELLING  . Cephalexin Anaphylaxis and Swelling  . Benadryl [Diphenhydramine Hcl] Nausea And Vomiting  . Nexium [Esomeprazole Magnesium] Palpitations  . Spiriva [Tiotropium Bromide Monohydrate] Palpitations    Immunization History  Administered Date(s) Administered  . Influenza Split 06/26/2012, 08/19/2013, 07/05/2014, 07/26/2016  . Influenza,inj,Quad PF,6+ Mos 07/16/2015  . Pneumococcal Conjugate-13 01/22/2015    Past Medical History:  Diagnosis Date  . Arthritis   . Asthma   . COPD (chronic obstructive pulmonary disease) (HCC)   . Diabetes mellitus   . Heart murmur   . High cholesterol   . Hyperlipidemia   . Hypertension   . Kidney infection   . Kidney stone   . Reflux     Tobacco History: Social History   Tobacco Use  Smoking Status Current Every Day Smoker  . Packs/day: 0.25  . Years: 45.00  . Pack years: 11.25  . Types: Cigarettes  Smokeless Tobacco Current User  . Types: Chew  Tobacco Comment  Started smoking at age 71.  Currently smoking 2-3 cig per day   Ready to quit: No Counseling given: Yes Comment: Started smoking at age 28.  Currently smoking 2-3 cig per day   Outpatient Medications Prior to Visit  Medication Sig Dispense Refill  . albuterol (PROVENTIL) (2.5 MG/3ML) 0.083% nebulizer solution Take 3 mLs (2.5 mg total) by nebulization every 6 (six) hours as needed for wheezing or shortness of breath. 75 mL 12  . amLODipine (NORVASC) 10 MG tablet Take 10 mg by mouth daily.    Marland Kitchen aspirin EC 81 MG tablet Take 81 mg by mouth daily.     Marland Kitchen atorvastatin (LIPITOR) 20 MG tablet     . Fluticasone-Salmeterol (ADVAIR DISKUS) 500-50 MCG/DOSE AEPB Inhale 1 puff into the lungs 2  (two) times daily. 60 each 3  . metFORMIN (GLUCOPHAGE) 500 MG tablet Take 500 mg by mouth 2 (two) times daily with a meal.      . benzonatate (TESSALON) 200 MG capsule Take 1 capsule (200 mg total) by mouth 3 (three) times daily as needed for cough. (Patient not taking: Reported on 06/25/2018) 30 capsule 1  . doxycycline (VIBRA-TABS) 100 MG tablet Take 1 tablet (100 mg total) by mouth 2 (two) times daily. (Patient not taking: Reported on 06/25/2018) 14 tablet 0  . meloxicam (MOBIC) 7.5 MG tablet Take 7.5 mg by mouth daily as needed.     . pantoprazole (PROTONIX) 40 MG tablet Take 1 tablet by mouth daily.    . predniSONE (DELTASONE) 10 MG tablet 4 tabs for 2 days, then 3 tabs for 2 days, 2 tabs for 2 days, then 1 tab for 2 days, then stop (Patient not taking: Reported on 06/25/2018) 20 tablet 0   No facility-administered medications prior to visit.      Review of Systems  Constitutional:   No  weight loss, night sweats,  Fevers, chills, fatigue, or  lassitude.  HEENT:   No headaches,  Difficulty swallowing,  Tooth/dental problems, or  Sore throat,                No sneezing, itching, ear ache, nasal congestion, post nasal drip,   CV:  No chest pain,  Orthopnea, PND, swelling in lower extremities, anasarca, dizziness, palpitations, syncope.   GI  +heartburn, indigestion,   NO  vomiting, diarrhea, change in bowel habits, loss of appetite, bloody stools.   Resp: No shortness of breath with exertion or at rest.  No excess mucus, no productive cough,  No non-productive cough,  No coughing up of blood.  No change in color of mucus.  No wheezing.  No chest wall deformity  Skin: no rash or lesions.  GU: no dysuria, change in color of urine, no urgency or frequency.  No flank pain, no hematuria   MS:  +joint pains    Physical Exam  BP 106/70 (BP Location: Left Arm, Cuff Size: Normal)   Pulse 66   Ht 5\' 4"  (1.626 m)   Wt 195 lb (88.5 kg)   SpO2 99%   BMI 33.47 kg/m   GEN: A/Ox3; pleasant  , NAD, obese    HEENT:  [place]/AT,  EACs-clear, TMs-wnl, NOSE-clear, THROAT-clear, no lesions, no postnasal drip or exudate noted.   NECK:  Supple w/ fair ROM; no JVD; normal carotid impulses w/o bruits; no thyromegaly or nodules palpated; no lymphadenopathy.    RESP  Clear  P & A; w/o, wheezes/ rales/ or rhonchi. no accessory muscle use, no dullness to percussion  CARD:  RRR, no m/r/g, no peripheral edema, pulses intact, no cyanosis or clubbing.  GI:   Soft & nt; nml bowel sounds; no organomegaly or masses detected. BS +, no guarding or rebound noted.   Musco: Warm bil, no deformities or joint swelling noted. Tender along right superior shoulder and right lateral neck , no obvious swelling . Skin clear . Painful ROM of right shoulder with adduction, normal grips. ROM normal . Normal strength noted.   Neuro: alert, no focal deficits noted.  Normal gait . Equal strength and grips .   Skin: Warm, no lesions or rashes    Lab Results:   BMET  BNP No results found for: BNP  ProBNP No results found for: PROBNP  Imaging: No results found.   Assessment & Plan:   COPD (chronic obstructive pulmonary disease) gold stage B with ongoing tobacco use Appears stable  Smoking cessation  Check Spirometry on return .   Plan  Cont on Advair Twice daily  , oral inhaler care  Smoking cessation  Get flu shot next week .   Arthritis Suspect Right shoulder strain vs tendonitis .  Trial of brief NSAIDs , ice and heat  If not improving see PCP or Ortho (has appointment with both) . Appears to be having lots of polyarthralgia may need further workup to r/o RA , etc.  No obvious joint destruction on exam .   Plan  Naproxen ( pt prefers as worked in past) x 1 week  Ice and heat Please contact office for sooner follow up if symptoms do not improve or worsen or seek emergency care    GERD (gastroesophageal reflux disease) GERD - increase Zantac Twice daily   GERD diet  If not better may  need to change back to PPI and or refer to GI .  follow up with PCP this week with labs.      Rubye Oaks, NP 06/25/2018

## 2018-06-26 NOTE — Progress Notes (Signed)
Reviewed and agree with assessment/plan.   Shenandoah Yeats, MD [facility] 09/21/2016, 12:24 PM Pager:  [phone]  

## 2018-06-28 DIAGNOSIS — J449 Chronic obstructive pulmonary disease, unspecified: Secondary | ICD-10-CM | POA: Diagnosis not present

## 2018-06-28 DIAGNOSIS — Z780 Asymptomatic menopausal state: Secondary | ICD-10-CM | POA: Diagnosis not present

## 2018-06-28 DIAGNOSIS — M8588 Other specified disorders of bone density and structure, other site: Secondary | ICD-10-CM | POA: Diagnosis not present

## 2018-06-28 DIAGNOSIS — E669 Obesity, unspecified: Secondary | ICD-10-CM | POA: Diagnosis not present

## 2018-06-28 DIAGNOSIS — E1169 Type 2 diabetes mellitus with other specified complication: Secondary | ICD-10-CM | POA: Diagnosis not present

## 2018-06-28 DIAGNOSIS — Z7951 Long term (current) use of inhaled steroids: Secondary | ICD-10-CM | POA: Diagnosis not present

## 2018-06-29 DIAGNOSIS — I739 Peripheral vascular disease, unspecified: Secondary | ICD-10-CM | POA: Diagnosis not present

## 2018-07-02 ENCOUNTER — Ambulatory Visit (INDEPENDENT_AMBULATORY_CARE_PROVIDER_SITE_OTHER): Payer: Medicare Other | Admitting: Adult Health

## 2018-07-02 ENCOUNTER — Encounter: Payer: Self-pay | Admitting: Adult Health

## 2018-07-02 ENCOUNTER — Ambulatory Visit: Payer: Medicare Other | Admitting: Primary Care

## 2018-07-02 ENCOUNTER — Ambulatory Visit (HOSPITAL_BASED_OUTPATIENT_CLINIC_OR_DEPARTMENT_OTHER)
Admission: RE | Admit: 2018-07-02 | Discharge: 2018-07-02 | Disposition: A | Discharge status: Home / Self Care | Payer: Medicare Other | Source: Ambulatory Visit | Attending: Adult Health | Admitting: Adult Health

## 2018-07-02 VITALS — BP 121/78 | HR 70 | Temp 99.5°F

## 2018-07-02 DIAGNOSIS — R05 Cough: Secondary | ICD-10-CM | POA: Diagnosis not present

## 2018-07-02 DIAGNOSIS — Z720 Tobacco use: Secondary | ICD-10-CM | POA: Diagnosis not present

## 2018-07-02 DIAGNOSIS — J441 Chronic obstructive pulmonary disease with (acute) exacerbation: Secondary | ICD-10-CM | POA: Diagnosis not present

## 2018-07-02 DIAGNOSIS — R0602 Shortness of breath: Secondary | ICD-10-CM | POA: Diagnosis not present

## 2018-07-02 DIAGNOSIS — I700 Atherosclerosis of aorta: Secondary | ICD-10-CM | POA: Insufficient documentation

## 2018-07-02 MED ORDER — BENZONATATE 200 MG PO CAPS: 200.0000 mg | ORAL_CAPSULE | Freq: Three times a day (TID) | ORAL | 1 refills | Status: AC | PRN

## 2018-07-02 MED ORDER — PREDNISONE 10 MG PO TABS: ORAL_TABLET | ORAL | 0 refills | Status: DC

## 2018-07-02 MED ORDER — DOXYCYCLINE HYCLATE 100 MG PO TABS: 100.0000 mg | ORAL_TABLET | Freq: Two times a day (BID) | ORAL | 0 refills | Status: DC

## 2018-07-02 MED FILL — predniSONE 10 MG TABS: 10 | 8 days supply | Qty: 20 | Fill #0

## 2018-07-02 MED FILL — DOXYCYCLINE HYCLATE 100 MG: 100 | 7 days supply | Qty: 14 | Fill #0

## 2018-07-02 NOTE — Assessment & Plan Note (Signed)
Exacerbation in active smoker  Smoking cessation is key  Check cxr today .   Plan  Patient Instructions  Doxycycline 100mg  Twice daily  For 7 days , take with food.  Wear sunscreen as may cause you to sunburn while taking . Prednisone taper over next week.   Mucinex DM Twice daily  As needed  Cough/congestion  Tessalon Three times a day  As needed  Cough  Work on not smoking .  Chest xray today .  Follow up with Dr.  In 2 months and As needed  -[place] office  Please contact office for sooner follow up if symptoms do not improve or worsen or seek emergency care

## 2018-07-02 NOTE — Progress Notes (Signed)
@Patient  ID: , female    DOB: 08-02-1951, 67 y.o.   MRN: [id number]  Chief Complaint  Patient presents with  . Acute Visit    COPD     Referring provider: [id number], MD  HPI: 67yo female active smoker (cigs and dip )  followed for moderate COPD  Medical hx significant for HTN, hyperlipidemia and diabetes  TEST 67yo 09/06/2012 FeV1 77% FeV1/FVC 77% Fef 25 75 49% CXR 07/2016, 11/2017  neg Prevnar 2016   07/02/2018 Acute OV : COPD  Pt presents for an acute office visit. Complains of 3 days of increased cough and congestion with thick yellow mucus . Was exposed to family member with double pneumonia . Has low grade fever. No body aches, chest pain orthopnea or edema. Has intermittent wheezing .  Remains on Advair .  Has not taken any otc meds. Would like refill of tessalon.   Was seen 1 week ago for follow up . Was having shoulder pain , resolved with naproxen .      Still smoking , cessation discussed.    Allergies  Allergen Reactions  . Azithromycin Anaphylaxis and Swelling    Eyes swell  . Benazepril Anaphylaxis    Other reaction(s): SWELLING  . Cephalexin Anaphylaxis and Swelling  . Benadryl [Diphenhydramine Hcl] Nausea And Vomiting  . Nexium [Esomeprazole Magnesium] Palpitations  . Spiriva [Tiotropium Bromide Monohydrate] Palpitations    Immunization History  Administered Date(s) Administered  . Influenza Split 06/26/2012, 08/19/2013, 07/05/2014, 07/26/2016  . Influenza,inj,Quad PF,6+ Mos 07/16/2015  . Pneumococcal Conjugate-13 01/22/2015    Past Medical History:  Diagnosis Date  . Arthritis   . Asthma   . COPD (chronic obstructive pulmonary disease) (HCC)   . Diabetes mellitus   . Heart murmur   . High cholesterol   . Hyperlipidemia   . Hypertension   . Kidney infection   . Kidney stone   . Reflux     Tobacco History: Social History   Tobacco Use  Smoking Status Current Every Day Smoker  . Packs/day: 0.25  .  Years: 45.00  . Pack years: 11.25  . Types: Cigarettes  Smokeless Tobacco Current User  . Types: Chew  Tobacco Comment   Started smoking at age 66.  Currently smoking 2-3 cig per day   Ready to quit: No Counseling given: Yes Comment: Started smoking at age 50.  Currently smoking 2-3 cig per day   Outpatient Medications Prior to Visit  Medication Sig Dispense Refill  . albuterol (PROVENTIL) (2.5 MG/3ML) 0.083% nebulizer solution Take 3 mLs (2.5 mg total) by nebulization every 6 (six) hours as needed for wheezing or shortness of breath. 75 mL 12  . amLODipine (NORVASC) 10 MG tablet Take 10 mg by mouth daily.    18 aspirin EC 81 MG tablet Take 81 mg by mouth daily.     Marland Kitchen atorvastatin (LIPITOR) 20 MG tablet     . Fluticasone-Salmeterol (ADVAIR DISKUS) 500-50 MCG/DOSE AEPB Inhale 1 puff into the lungs 2 (two) times daily. 60 each 3  . metFORMIN (GLUCOPHAGE) 500 MG tablet Take 500 mg by mouth 2 (two) times daily with a meal.      . naproxen (NAPROSYN) 500 MG tablet Take 1 tablet (500 mg total) by mouth 2 (two) times daily with a meal. 14 tablet 0  . ranitidine (ZANTAC) 150 MG tablet Take 1 tablet (150 mg total) by mouth 2 (two) times daily. 60 tablet 3   No facility-administered medications prior to visit.  Review of Systems  Constitutional:   No  weight loss, night sweats,  + Fevers, chills, fatigue, or  lassitude.  HEENT:   No headaches,  Difficulty swallowing,  Tooth/dental problems, or  Sore throat,                No sneezing, itching, ear ache,  +nasal congestion, post nasal drip,   CV:  No chest pain,  Orthopnea, PND, swelling in lower extremities, anasarca, dizziness, palpitations, syncope.   GI  No heartburn, indigestion, abdominal pain, nausea, vomiting, diarrhea, change in bowel habits, loss of appetite, bloody stools.   Resp:   No chest wall deformity  Skin: no rash or lesions.  GU: no dysuria, change in color of urine, no urgency or frequency.  No flank pain, no  hematuria   MS:  No joint pain or swelling.  No decreased range of motion.  No back pain.    Physical Exam  BP 121/78   Pulse 70   Temp 99.5 F (37.5 C)   SpO2 97%   GEN: A/Ox3; pleasant , NAD, elderly    HEENT:  [place]/AT,  EACs-clear, TMs-wnl, NOSE-clear drainage , THROAT-clear, no lesions, no postnasal drip or exudate noted.   NECK:  Supple w/ fair ROM; no JVD; normal carotid impulses w/o bruits; no thyromegaly or nodules palpated; no lymphadenopathy.    RESP  Clear  P & A; w/o, wheezes/ rales/ or rhonchi. no accessory muscle use, no dullness to percussion  CARD:  RRR, no m/r/g, no peripheral edema, pulses intact, no cyanosis or clubbing.  GI:   Soft & nt; nml bowel sounds; no organomegaly or masses detected.   Musco: Warm bil, no deformities or joint swelling noted.   Neuro: alert, no focal deficits noted.    Skin: Warm, no lesions or rashes    Lab Results:  CBC  BNP No results found for: BNP  ProBNP No results found for: PROBNP  Imaging: No results found.   Assessment & Plan:   COPD (chronic obstructive pulmonary disease) gold stage B with ongoing tobacco use Exacerbation in active smoker  Smoking cessation is key  Check cxr today .   Plan  Patient Instructions  Doxycycline 100mg  Twice daily  For 7 days , take with food.  Wear sunscreen as may cause you to sunburn while taking . Prednisone taper over next week.   Mucinex DM Twice daily  As needed  Cough/congestion  Tessalon Three times a day  As needed  Cough  Work on not smoking .  Chest xray today .  Follow up with Dr.  In 2 months and As needed  -[place] office  Please contact office for sooner follow up if symptoms do not improve or worsen or seek emergency care       Tobacco abuse Smoking cessation      Craige Cotta, NP 07/02/2018

## 2018-07-02 NOTE — Assessment & Plan Note (Signed)
Smoking cessation  

## 2018-07-02 NOTE — Patient Instructions (Addendum)
Doxycycline 100mg  Twice daily  For 7 days , take with food.  Wear sunscreen as may cause you to sunburn while taking . Prednisone taper over next week.   Mucinex DM Twice daily  As needed  Cough/congestion  Tessalon Three times a day  As needed  Cough  Work on not smoking .  Chest xray today .  Follow up with Dr.  In 2 months and As needed  -[place] office  Please contact office for sooner follow up if symptoms do not improve or worsen or seek emergency care

## 2018-07-03 NOTE — Progress Notes (Signed)
Reviewed and agree with assessment/plan.   Arne Schlender, MD [facility] 09/21/2016, 12:24 PM Pager:  [phone]  

## 2018-07-05 ENCOUNTER — Telehealth: Payer: Self-pay | Admitting: Adult Health

## 2018-07-05 NOTE — Telephone Encounter (Signed)
Notes recorded by Julio Sicks, NP on 07/02/2018 at 3:34 PM EDT No sign of PNA  Cont w/ ov recs Please contact office for sooner follow up if symptoms do not improve or worsen or seek emergency care    Called mobile , no answer 07/02/18 -------------------------------------------------------- Spoke with pt. She is aware of results. Nothing further was needed.

## 2018-07-09 ENCOUNTER — Telehealth: Payer: Self-pay | Admitting: Pulmonary Disease

## 2018-07-09 DIAGNOSIS — M2041 Other hammer toe(s) (acquired), right foot: Secondary | ICD-10-CM | POA: Diagnosis not present

## 2018-07-09 DIAGNOSIS — J449 Chronic obstructive pulmonary disease, unspecified: Secondary | ICD-10-CM | POA: Diagnosis not present

## 2018-07-09 DIAGNOSIS — F17200 Nicotine dependence, unspecified, uncomplicated: Secondary | ICD-10-CM | POA: Diagnosis not present

## 2018-07-09 DIAGNOSIS — B351 Tinea unguium: Secondary | ICD-10-CM | POA: Diagnosis not present

## 2018-07-09 DIAGNOSIS — J441 Chronic obstructive pulmonary disease with (acute) exacerbation: Secondary | ICD-10-CM

## 2018-07-09 DIAGNOSIS — M79675 Pain in left toe(s): Secondary | ICD-10-CM | POA: Diagnosis not present

## 2018-07-09 DIAGNOSIS — I739 Peripheral vascular disease, unspecified: Secondary | ICD-10-CM | POA: Diagnosis not present

## 2018-07-09 DIAGNOSIS — M722 Plantar fascial fibromatosis: Secondary | ICD-10-CM | POA: Diagnosis not present

## 2018-07-09 DIAGNOSIS — M2011 Hallux valgus (acquired), right foot: Secondary | ICD-10-CM | POA: Diagnosis not present

## 2018-07-09 DIAGNOSIS — E1151 Type 2 diabetes mellitus with diabetic peripheral angiopathy without gangrene: Secondary | ICD-10-CM | POA: Diagnosis not present

## 2018-07-09 NOTE — Telephone Encounter (Signed)
Attempted to call Patient re guarding new nebulizer.  Left message for her to call back.

## 2018-07-10 NOTE — Telephone Encounter (Signed)
Pt is calling back [phone]

## 2018-07-10 NOTE — Telephone Encounter (Signed)
Called and spoke with pt who stated she is needing a new nebulizer machine due to the red light keep coming on on her current one.  Verified which company provides pt the nebulizer and have placed an order for pt.  Nothing further needed.

## 2018-07-11 DIAGNOSIS — Z0000 Encounter for general adult medical examination without abnormal findings: Secondary | ICD-10-CM | POA: Diagnosis not present

## 2018-07-16 DIAGNOSIS — N644 Mastodynia: Secondary | ICD-10-CM | POA: Diagnosis not present

## 2018-07-16 DIAGNOSIS — J449 Chronic obstructive pulmonary disease, unspecified: Secondary | ICD-10-CM | POA: Diagnosis not present

## 2018-07-16 DIAGNOSIS — E119 Type 2 diabetes mellitus without complications: Secondary | ICD-10-CM | POA: Diagnosis not present

## 2018-07-16 DIAGNOSIS — G5601 Carpal tunnel syndrome, right upper limb: Secondary | ICD-10-CM | POA: Diagnosis not present

## 2018-07-16 DIAGNOSIS — N6452 Nipple discharge: Secondary | ICD-10-CM | POA: Diagnosis not present

## 2018-07-16 DIAGNOSIS — L84 Corns and callosities: Secondary | ICD-10-CM | POA: Diagnosis not present

## 2018-07-26 DIAGNOSIS — R928 Other abnormal and inconclusive findings on diagnostic imaging of breast: Secondary | ICD-10-CM | POA: Diagnosis not present

## 2018-07-26 DIAGNOSIS — N644 Mastodynia: Secondary | ICD-10-CM | POA: Diagnosis not present

## 2018-07-26 DIAGNOSIS — Z23 Encounter for immunization: Secondary | ICD-10-CM | POA: Diagnosis not present

## 2018-07-31 DIAGNOSIS — M79641 Pain in right hand: Secondary | ICD-10-CM | POA: Diagnosis not present

## 2018-07-31 DIAGNOSIS — G5601 Carpal tunnel syndrome, right upper limb: Secondary | ICD-10-CM | POA: Diagnosis not present

## 2018-08-03 DIAGNOSIS — E1151 Type 2 diabetes mellitus with diabetic peripheral angiopathy without gangrene: Secondary | ICD-10-CM | POA: Diagnosis not present

## 2018-08-03 DIAGNOSIS — I739 Peripheral vascular disease, unspecified: Secondary | ICD-10-CM | POA: Diagnosis not present

## 2018-08-03 DIAGNOSIS — M722 Plantar fascial fibromatosis: Secondary | ICD-10-CM | POA: Diagnosis not present

## 2018-08-03 DIAGNOSIS — M2011 Hallux valgus (acquired), right foot: Secondary | ICD-10-CM | POA: Diagnosis not present

## 2018-08-03 DIAGNOSIS — M79675 Pain in left toe(s): Secondary | ICD-10-CM | POA: Diagnosis not present

## 2018-08-03 DIAGNOSIS — B351 Tinea unguium: Secondary | ICD-10-CM | POA: Diagnosis not present

## 2018-08-03 DIAGNOSIS — M2041 Other hammer toe(s) (acquired), right foot: Secondary | ICD-10-CM | POA: Diagnosis not present

## 2018-08-07 DIAGNOSIS — M79641 Pain in right hand: Secondary | ICD-10-CM | POA: Diagnosis not present

## 2018-09-24 DIAGNOSIS — G5601 Carpal tunnel syndrome, right upper limb: Secondary | ICD-10-CM | POA: Diagnosis not present

## 2018-09-24 DIAGNOSIS — E669 Obesity, unspecified: Secondary | ICD-10-CM | POA: Diagnosis not present

## 2018-09-24 DIAGNOSIS — E1169 Type 2 diabetes mellitus with other specified complication: Secondary | ICD-10-CM | POA: Diagnosis not present

## 2018-10-09 DIAGNOSIS — Z79899 Other long term (current) drug therapy: Secondary | ICD-10-CM | POA: Diagnosis not present

## 2018-10-09 DIAGNOSIS — Z124 Encounter for screening for malignant neoplasm of cervix: Secondary | ICD-10-CM | POA: Diagnosis not present

## 2018-10-09 DIAGNOSIS — G629 Polyneuropathy, unspecified: Secondary | ICD-10-CM | POA: Diagnosis not present

## 2018-10-09 DIAGNOSIS — Z113 Encounter for screening for infections with a predominantly sexual mode of transmission: Secondary | ICD-10-CM | POA: Diagnosis not present

## 2018-10-25 DIAGNOSIS — R300 Dysuria: Secondary | ICD-10-CM | POA: Diagnosis not present

## 2018-10-25 DIAGNOSIS — N898 Other specified noninflammatory disorders of vagina: Secondary | ICD-10-CM | POA: Diagnosis not present

## 2018-10-31 DIAGNOSIS — N898 Other specified noninflammatory disorders of vagina: Secondary | ICD-10-CM | POA: Diagnosis not present

## 2019-01-08 DIAGNOSIS — F17220 Nicotine dependence, chewing tobacco, uncomplicated: Secondary | ICD-10-CM | POA: Diagnosis not present

## 2019-01-08 DIAGNOSIS — J441 Chronic obstructive pulmonary disease with (acute) exacerbation: Secondary | ICD-10-CM | POA: Diagnosis not present

## 2019-01-08 DIAGNOSIS — N644 Mastodynia: Secondary | ICD-10-CM | POA: Diagnosis not present

## 2019-01-08 DIAGNOSIS — I10 Essential (primary) hypertension: Secondary | ICD-10-CM | POA: Diagnosis not present

## 2019-01-18 DIAGNOSIS — R928 Other abnormal and inconclusive findings on diagnostic imaging of breast: Secondary | ICD-10-CM | POA: Diagnosis not present

## 2019-01-18 DIAGNOSIS — N644 Mastodynia: Secondary | ICD-10-CM | POA: Diagnosis not present

## 2019-02-11 DIAGNOSIS — Z961 Presence of intraocular lens: Secondary | ICD-10-CM | POA: Diagnosis not present

## 2019-02-11 DIAGNOSIS — H5203 Hypermetropia, bilateral: Secondary | ICD-10-CM | POA: Diagnosis not present

## 2019-02-11 DIAGNOSIS — H52203 Unspecified astigmatism, bilateral: Secondary | ICD-10-CM | POA: Diagnosis not present

## 2019-02-11 DIAGNOSIS — E119 Type 2 diabetes mellitus without complications: Secondary | ICD-10-CM | POA: Diagnosis not present

## 2019-02-11 DIAGNOSIS — H04123 Dry eye syndrome of bilateral lacrimal glands: Secondary | ICD-10-CM | POA: Diagnosis not present

## 2019-02-11 DIAGNOSIS — H524 Presbyopia: Secondary | ICD-10-CM | POA: Diagnosis not present

## 2019-02-11 DIAGNOSIS — Z7984 Long term (current) use of oral hypoglycemic drugs: Secondary | ICD-10-CM | POA: Diagnosis not present

## 2019-02-26 DIAGNOSIS — R001 Bradycardia, unspecified: Secondary | ICD-10-CM | POA: Diagnosis not present

## 2019-02-26 DIAGNOSIS — E782 Mixed hyperlipidemia: Secondary | ICD-10-CM | POA: Diagnosis not present

## 2019-02-26 DIAGNOSIS — I10 Essential (primary) hypertension: Secondary | ICD-10-CM | POA: Diagnosis not present

## 2019-02-26 DIAGNOSIS — R0789 Other chest pain: Secondary | ICD-10-CM | POA: Diagnosis not present

## 2019-02-26 DIAGNOSIS — E669 Obesity, unspecified: Secondary | ICD-10-CM | POA: Diagnosis not present

## 2019-02-26 DIAGNOSIS — K219 Gastro-esophageal reflux disease without esophagitis: Secondary | ICD-10-CM | POA: Diagnosis not present

## 2019-02-26 DIAGNOSIS — E119 Type 2 diabetes mellitus without complications: Secondary | ICD-10-CM | POA: Diagnosis not present

## 2019-02-26 DIAGNOSIS — E1169 Type 2 diabetes mellitus with other specified complication: Secondary | ICD-10-CM | POA: Diagnosis not present

## 2019-02-26 DIAGNOSIS — Z6832 Body mass index (BMI) 32.0-32.9, adult: Secondary | ICD-10-CM | POA: Diagnosis not present

## 2019-02-26 DIAGNOSIS — R079 Chest pain, unspecified: Secondary | ICD-10-CM | POA: Diagnosis not present

## 2019-02-27 DIAGNOSIS — G5601 Carpal tunnel syndrome, right upper limb: Secondary | ICD-10-CM | POA: Diagnosis not present

## 2019-02-27 DIAGNOSIS — M1990 Unspecified osteoarthritis, unspecified site: Secondary | ICD-10-CM | POA: Diagnosis not present

## 2019-03-26 DIAGNOSIS — G5601 Carpal tunnel syndrome, right upper limb: Secondary | ICD-10-CM | POA: Diagnosis not present

## 2019-03-26 DIAGNOSIS — E1169 Type 2 diabetes mellitus with other specified complication: Secondary | ICD-10-CM | POA: Diagnosis not present

## 2019-03-26 DIAGNOSIS — G5621 Lesion of ulnar nerve, right upper limb: Secondary | ICD-10-CM | POA: Diagnosis not present

## 2019-03-26 DIAGNOSIS — M79641 Pain in right hand: Secondary | ICD-10-CM | POA: Diagnosis not present

## 2019-03-26 DIAGNOSIS — E669 Obesity, unspecified: Secondary | ICD-10-CM | POA: Diagnosis not present

## 2019-04-03 DIAGNOSIS — M65341 Trigger finger, right ring finger: Secondary | ICD-10-CM | POA: Diagnosis not present

## 2019-04-03 DIAGNOSIS — G5601 Carpal tunnel syndrome, right upper limb: Secondary | ICD-10-CM | POA: Diagnosis not present

## 2019-04-12 DIAGNOSIS — R0789 Other chest pain: Secondary | ICD-10-CM | POA: Diagnosis not present

## 2019-04-12 DIAGNOSIS — Z6832 Body mass index (BMI) 32.0-32.9, adult: Secondary | ICD-10-CM | POA: Diagnosis not present

## 2019-04-12 DIAGNOSIS — E119 Type 2 diabetes mellitus without complications: Secondary | ICD-10-CM | POA: Diagnosis not present

## 2019-04-12 DIAGNOSIS — E669 Obesity, unspecified: Secondary | ICD-10-CM | POA: Diagnosis not present

## 2019-04-12 DIAGNOSIS — F17220 Nicotine dependence, chewing tobacco, uncomplicated: Secondary | ICD-10-CM | POA: Diagnosis not present

## 2019-04-15 DIAGNOSIS — R0602 Shortness of breath: Secondary | ICD-10-CM | POA: Diagnosis not present

## 2019-04-15 DIAGNOSIS — J439 Emphysema, unspecified: Secondary | ICD-10-CM | POA: Diagnosis not present

## 2019-04-15 DIAGNOSIS — R0789 Other chest pain: Secondary | ICD-10-CM | POA: Diagnosis not present

## 2019-04-30 DIAGNOSIS — G5601 Carpal tunnel syndrome, right upper limb: Secondary | ICD-10-CM | POA: Diagnosis not present

## 2019-04-30 DIAGNOSIS — M79641 Pain in right hand: Secondary | ICD-10-CM | POA: Diagnosis not present

## 2019-04-30 DIAGNOSIS — G5621 Lesion of ulnar nerve, right upper limb: Secondary | ICD-10-CM | POA: Diagnosis not present

## 2019-04-30 DIAGNOSIS — R768 Other specified abnormal immunological findings in serum: Secondary | ICD-10-CM | POA: Diagnosis not present

## 2019-05-10 DIAGNOSIS — E669 Obesity, unspecified: Secondary | ICD-10-CM | POA: Diagnosis not present

## 2019-05-10 DIAGNOSIS — R079 Chest pain, unspecified: Secondary | ICD-10-CM | POA: Diagnosis not present

## 2019-05-10 DIAGNOSIS — R0789 Other chest pain: Secondary | ICD-10-CM | POA: Diagnosis not present

## 2019-05-10 DIAGNOSIS — E1169 Type 2 diabetes mellitus with other specified complication: Secondary | ICD-10-CM | POA: Diagnosis not present

## 2019-05-15 DIAGNOSIS — M1990 Unspecified osteoarthritis, unspecified site: Secondary | ICD-10-CM | POA: Diagnosis not present

## 2019-05-15 DIAGNOSIS — M20031 Swan-neck deformity of right finger(s): Secondary | ICD-10-CM | POA: Diagnosis not present

## 2019-05-15 DIAGNOSIS — G5621 Lesion of ulnar nerve, right upper limb: Secondary | ICD-10-CM | POA: Diagnosis not present

## 2019-05-15 DIAGNOSIS — M65341 Trigger finger, right ring finger: Secondary | ICD-10-CM | POA: Diagnosis not present

## 2019-05-15 DIAGNOSIS — G5601 Carpal tunnel syndrome, right upper limb: Secondary | ICD-10-CM | POA: Diagnosis not present

## 2019-05-22 DIAGNOSIS — M65341 Trigger finger, right ring finger: Secondary | ICD-10-CM | POA: Diagnosis not present

## 2019-05-22 DIAGNOSIS — M79641 Pain in right hand: Secondary | ICD-10-CM | POA: Diagnosis not present

## 2019-05-29 DIAGNOSIS — R21 Rash and other nonspecific skin eruption: Secondary | ICD-10-CM | POA: Diagnosis not present

## 2019-06-04 DIAGNOSIS — M25441 Effusion, right hand: Secondary | ICD-10-CM | POA: Diagnosis not present

## 2019-06-04 DIAGNOSIS — M19041 Primary osteoarthritis, right hand: Secondary | ICD-10-CM | POA: Diagnosis not present

## 2019-06-04 DIAGNOSIS — G5621 Lesion of ulnar nerve, right upper limb: Secondary | ICD-10-CM | POA: Diagnosis not present

## 2019-06-04 DIAGNOSIS — M79641 Pain in right hand: Secondary | ICD-10-CM | POA: Diagnosis not present

## 2019-06-04 DIAGNOSIS — M25431 Effusion, right wrist: Secondary | ICD-10-CM | POA: Diagnosis not present

## 2019-06-04 DIAGNOSIS — G5601 Carpal tunnel syndrome, right upper limb: Secondary | ICD-10-CM | POA: Diagnosis not present

## 2019-06-04 DIAGNOSIS — R768 Other specified abnormal immunological findings in serum: Secondary | ICD-10-CM | POA: Diagnosis not present

## 2019-06-07 DIAGNOSIS — M79641 Pain in right hand: Secondary | ICD-10-CM | POA: Diagnosis not present

## 2019-06-07 DIAGNOSIS — R768 Other specified abnormal immunological findings in serum: Secondary | ICD-10-CM | POA: Diagnosis not present

## 2019-06-07 DIAGNOSIS — M05741 Rheumatoid arthritis with rheumatoid factor of right hand without organ or systems involvement: Secondary | ICD-10-CM | POA: Diagnosis not present

## 2019-06-10 DIAGNOSIS — E1169 Type 2 diabetes mellitus with other specified complication: Secondary | ICD-10-CM | POA: Diagnosis not present

## 2019-06-10 DIAGNOSIS — E669 Obesity, unspecified: Secondary | ICD-10-CM | POA: Diagnosis not present

## 2019-06-10 DIAGNOSIS — M7989 Other specified soft tissue disorders: Secondary | ICD-10-CM | POA: Diagnosis not present

## 2019-06-10 DIAGNOSIS — I10 Essential (primary) hypertension: Secondary | ICD-10-CM | POA: Diagnosis not present

## 2019-06-10 DIAGNOSIS — R350 Frequency of micturition: Secondary | ICD-10-CM | POA: Diagnosis not present

## 2019-06-11 ENCOUNTER — Encounter: Payer: Self-pay | Admitting: Adult Health

## 2019-06-11 ENCOUNTER — Ambulatory Visit (INDEPENDENT_AMBULATORY_CARE_PROVIDER_SITE_OTHER): Payer: Medicare Other | Admitting: Adult Health

## 2019-06-11 ENCOUNTER — Other Ambulatory Visit: Payer: Self-pay

## 2019-06-11 VITALS — BP 118/64 | HR 69 | Temp 97.6°F | Ht 66.0 in | Wt 199.4 lb

## 2019-06-11 DIAGNOSIS — J441 Chronic obstructive pulmonary disease with (acute) exacerbation: Secondary | ICD-10-CM

## 2019-06-11 DIAGNOSIS — Z720 Tobacco use: Secondary | ICD-10-CM | POA: Diagnosis not present

## 2019-06-11 DIAGNOSIS — M7989 Other specified soft tissue disorders: Secondary | ICD-10-CM | POA: Diagnosis not present

## 2019-06-11 DIAGNOSIS — R05 Cough: Secondary | ICD-10-CM | POA: Diagnosis not present

## 2019-06-11 DIAGNOSIS — I10 Essential (primary) hypertension: Secondary | ICD-10-CM | POA: Diagnosis not present

## 2019-06-11 DIAGNOSIS — Z20822 Contact with and (suspected) exposure to covid-19: Secondary | ICD-10-CM

## 2019-06-11 DIAGNOSIS — R059 Cough, unspecified: Secondary | ICD-10-CM

## 2019-06-11 MED ORDER — DOXYCYCLINE HYCLATE 100 MG PO TABS: 100.0000 mg | ORAL_TABLET | Freq: Two times a day (BID) | ORAL | 0 refills | Status: DC

## 2019-06-11 MED ORDER — PREDNISONE 10 MG PO TABS: ORAL_TABLET | ORAL | 0 refills | Status: DC

## 2019-06-11 MED ORDER — BENZONATATE 200 MG PO CAPS: 200.0000 mg | ORAL_CAPSULE | Freq: Three times a day (TID) | ORAL | 1 refills | Status: AC | PRN

## 2019-06-11 NOTE — Assessment & Plan Note (Signed)
Flare with bronchitis  Check COVID 19 testing   Plan  Patient Instructions  Doxycycline 100mg  Twice daily  For 7 days , take with food.  Wear sunscreen as may cause you to sunburn while taking . Prednisone taper over next week.   Mucinex DM Twice daily  As needed  Cough/congestion  Tessalon Three times a day  As needed  Cough  Work on not smoking .  Continue on Advair 1 puff Twice daily   Albuterol inhaler 2 puffs every 4hr as needed for wheezing .  COVID 19 testing .   Follow up with Dr. Halford Chessman or Kaysen Sefcik  In 6-8 weeks and As needed    Please contact office for sooner follow up if symptoms do not improve or worsen or seek emergency care

## 2019-06-11 NOTE — Progress Notes (Signed)
 @Patient  ID: [name], female    DOB: Feb 13, 1951, 68 y.o.   MRN: [id number]  Chief Complaint  Patient presents with  . Acute Visit    Pt c/o prod cough with clear to yellow mucus x 1 week. Pt denies increase in SOB, CP/tightness, f/c/s.     Referring provider: Bernerd LimboMitchell, John, MD  HPI: [id number] year old female active smoker (cigarettes and DIP) followed for moderate COPD Medical history significant for hypertension, hyperlipidemia and diabetes   TEST/EVENTS :  Cleda DaubSpiro 09/06/2012 FeV1 77% FeV1/FVC 77% Fef 25 75 49% CXR 07/2016, 3/2019neg Prevnar 2016  06/11/2019  Patient presents to the office for an acute office visit . Patient complains of productive cough with clear to yellow mucus x 1 week. Pt denies increase in SOB, CP/tightness, f/c/s. No recent travel. No  Known sick contacts. Doing well until 1 week ago. Has acute onset of cough and congestion . Some intermittent wheezing. Used albuterol inhaler with some help.  Good appetite , no n/v/d.  No loss of taste or smell.    She remains on Advair Twice daily.    Recently seen by rheumatology. Diagnosed with Rheumatoid Arthritis . Going to start on Methotrexate .   Still smoking ,discussed smoking cessation .    Allergies  Allergen Reactions  . Azithromycin Anaphylaxis and Swelling    Eyes swell  . Benazepril Anaphylaxis    Other reaction(s): SWELLING  . Cephalexin Anaphylaxis and Swelling  . Benadryl [Diphenhydramine Hcl] Nausea And Vomiting  . Nexium [Esomeprazole Magnesium] Palpitations  . Spiriva [Tiotropium Bromide Monohydrate] Palpitations    Immunization History  Administered Date(s) Administered  . Influenza Split 06/26/2012, 08/19/2013, 07/05/2014, 07/26/2016  . Influenza, High Dose Seasonal PF 07/26/2018  . Influenza,inj,Quad PF,6+ Mos 07/16/2015, 07/26/2016, 07/10/2017  . Pneumococcal Conjugate-13 01/22/2015    Past Medical History:  Diagnosis Date  . Arthritis   . Asthma   . COPD (chronic  obstructive pulmonary disease) (HCC)   . Diabetes mellitus   . Heart murmur   . High cholesterol   . Hyperlipidemia   . Hypertension   . Kidney infection   . Kidney stone   . Reflux     Tobacco History: Social History   Tobacco Use  Smoking Status Current Every Day Smoker  . Packs/day: 0.75  . Years: 52.00  . Pack years: 39.00  . Types: Cigarettes  . Start date: 09/26/1966  Smokeless Tobacco Current User  . Types: Chew  Tobacco Comment   Started smoking at age 68.  Currently smoking 2-3 cig per day   Ready to quit: No Counseling given: Yes Comment: Started smoking at age 68.  Currently smoking 2-3 cig per day   Outpatient Medications Prior to Visit  Medication Sig Dispense Refill  . albuterol (PROVENTIL) (2.5 MG/3ML) 0.083% nebulizer solution Take 3 mLs (2.5 mg total) by nebulization every 6 (six) hours as needed for wheezing or shortness of breath. 75 mL 12  . amLODipine (NORVASC) 10 MG tablet Take 10 mg by mouth daily.    Marland Kitchen. aspirin EC 81 MG tablet Take 81 mg by mouth daily.     Marland Kitchen. atorvastatin (LIPITOR) 20 MG tablet     . Fluticasone-Salmeterol (ADVAIR DISKUS) 500-50 MCG/DOSE AEPB Inhale 1 puff into the lungs 2 (two) times daily. 60 each 3  . metFORMIN (GLUCOPHAGE) 500 MG tablet Take 500 mg by mouth 2 (two) times daily with a meal.      . benzonatate (TESSALON) 200 MG capsule Take 1 capsule (200 mg  total) by mouth 3 (three) times daily as needed for cough. (Patient not taking: Reported on 06/11/2019) 30 capsule 1  . doxycycline (VIBRA-TABS) 100 MG tablet Take 1 tablet (100 mg total) by mouth 2 (two) times daily. 14 tablet 0  . naproxen (NAPROSYN) 500 MG tablet Take 1 tablet (500 mg total) by mouth 2 (two) times daily with a meal. (Patient not taking: Reported on 06/11/2019) 14 tablet 0  . predniSONE (DELTASONE) 10 MG tablet 4 tabs for 2 days, then 3 tabs for 2 days, 2 tabs for 2 days, then 1 tab for 2 days, then stop 20 tablet 0  . ranitidine (ZANTAC) 150 MG tablet Take 1  tablet (150 mg total) by mouth 2 (two) times daily. (Patient not taking: Reported on 06/11/2019) 60 tablet 3   No facility-administered medications prior to visit.      Review of Systems:   Constitutional:   No  weight loss, night sweats,  Fevers, chills,  +fatigue, or  lassitude.  HEENT:   No headaches,  Difficulty swallowing,  Tooth/dental problems, or  Sore throat,                No sneezing, itching, ear ache, + nasal congestion, post nasal drip,   CV:  No chest pain,  Orthopnea, PND, swelling in lower extremities, anasarca, dizziness, palpitations, syncope.   GI  No heartburn, indigestion, abdominal pain, nausea, vomiting, diarrhea, change in bowel habits, loss of appetite, bloody stools.   Resp: No chest wall deformity  Skin: no rash or lesions.  GU: no dysuria, change in color of urine, no urgency or frequency.  No flank pain, no hematuria   MS:  No joint pain or swelling.  No decreased range of motion.  No back pain.    Physical Exam  BP 118/64 (BP Location: Left Arm, Cuff Size: Normal)   Pulse 69   Temp 97.6 F (36.4 C) (Oral)   Ht 5\' 6"  (1.676 m)   Wt 199 lb 6.4 oz (90.4 kg)   SpO2 97%   BMI 32.18 kg/m   GEN: A/Ox3; pleasant , NAD   HEENT:  [place]/AT,  NOSE-clear, THROAT-clear, no lesions, no postnasal drip or exudate noted.   NECK:  Supple w/ fair ROM; no JVD; normal carotid impulses w/o bruits; no thyromegaly or nodules palpated; no lymphadenopathy.    RESP  Trace exp wheeze on forced exp, speaks in full sentences  no accessory muscle use, no dullness to percussion  CARD:  RRR, no m/r/g, no peripheral edema, pulses intact, no cyanosis or clubbing.  GI:   Soft & nt; nml bowel sounds; no organomegaly or masses detected.   Musco: Warm bil, no deformities or joint swelling noted.   Neuro: alert, no focal deficits noted.    Skin: Warm, no lesions or rashes    Lab Results:  CBC   BNP No results found for: BNP  ProBNP No results found for: PROBNP   Imaging: No results found.    No flowsheet data found.  No results found for: NITRICOXIDE      Assessment & Plan:   COPD (chronic obstructive pulmonary disease) gold stage B with ongoing tobacco use Flare with bronchitis  Check COVID 19 testing   Plan  Patient Instructions  Doxycycline 100mg  Twice daily  For 7 days , take with food.  Wear sunscreen as may cause you to sunburn while taking . Prednisone taper over next week.   Mucinex DM Twice daily  As needed  Cough/congestion  Tessalon Three times a day  As needed  Cough  Work on not smoking .  Continue on Advair 1 puff Twice daily   Albuterol inhaler 2 puffs every 4hr as needed for wheezing .  COVID 19 testing .   Follow up with Dr. Craige Cotta or Shaw Dobek  In 6-8 weeks and As needed    Please contact office for sooner follow up if symptoms do not improve or worsen or seek emergency care        Tobacco abuse Smoking cessation      Rubye Oaks, NP 06/11/2019

## 2019-06-11 NOTE — Patient Instructions (Addendum)
Doxycycline 100mg  Twice daily  For 7 days , take with food.  Wear sunscreen as may cause you to sunburn while taking . Prednisone taper over next week.   Mucinex DM Twice daily  As needed  Cough/congestion  Tessalon Three times a day  As needed  Cough  Work on not smoking .  Continue on Advair 1 puff Twice daily   Albuterol inhaler 2 puffs every 4hr as needed for wheezing .  COVID 19 testing .   Follow up with Dr. Halford Chessman or Garmon Dehn  In 6-8 weeks and As needed    Please contact office for sooner follow up if symptoms do not improve or worsen or seek emergency care

## 2019-06-11 NOTE — Assessment & Plan Note (Signed)
Smoking cessation  

## 2019-06-11 NOTE — Progress Notes (Signed)
Reviewed and agree with assessment/plan.   Aylinn Rydberg, MD [facility] 09/21/2016, 12:24 PM Pager:  [phone]  

## 2019-06-13 ENCOUNTER — Telehealth: Payer: Self-pay | Admitting: Adult Health

## 2019-06-13 LAB — NOVEL CORONAVIRUS, NAA: SARS-CoV-2, NAA: NOT DETECTED

## 2019-06-13 NOTE — Telephone Encounter (Signed)
PT returning call for test results

## 2019-06-13 NOTE — Telephone Encounter (Signed)
Called and left detailed message. It looks like a VM was left for pt for her COVID results. These were already given to pt earlier today. Advised on VM if there is anything further needed to call back. Will sign off.

## 2019-07-01 DIAGNOSIS — J441 Chronic obstructive pulmonary disease with (acute) exacerbation: Secondary | ICD-10-CM | POA: Diagnosis not present

## 2019-07-01 DIAGNOSIS — R079 Chest pain, unspecified: Secondary | ICD-10-CM | POA: Diagnosis not present

## 2019-07-01 DIAGNOSIS — M546 Pain in thoracic spine: Secondary | ICD-10-CM | POA: Diagnosis not present

## 2019-07-01 DIAGNOSIS — N3000 Acute cystitis without hematuria: Secondary | ICD-10-CM | POA: Diagnosis not present

## 2019-07-15 DIAGNOSIS — M25561 Pain in right knee: Secondary | ICD-10-CM | POA: Diagnosis not present

## 2019-07-15 DIAGNOSIS — J441 Chronic obstructive pulmonary disease with (acute) exacerbation: Secondary | ICD-10-CM | POA: Diagnosis not present

## 2019-07-15 DIAGNOSIS — E119 Type 2 diabetes mellitus without complications: Secondary | ICD-10-CM | POA: Diagnosis not present

## 2019-07-15 DIAGNOSIS — Z7984 Long term (current) use of oral hypoglycemic drugs: Secondary | ICD-10-CM | POA: Diagnosis not present

## 2019-07-24 ENCOUNTER — Ambulatory Visit: Payer: Medicare Other | Admitting: Adult Health

## 2019-07-29 DIAGNOSIS — Z23 Encounter for immunization: Secondary | ICD-10-CM | POA: Diagnosis not present

## 2019-07-29 DIAGNOSIS — F17210 Nicotine dependence, cigarettes, uncomplicated: Secondary | ICD-10-CM | POA: Diagnosis not present

## 2019-07-29 DIAGNOSIS — J441 Chronic obstructive pulmonary disease with (acute) exacerbation: Secondary | ICD-10-CM | POA: Diagnosis not present

## 2019-10-27 IMAGING — DX DG CHEST 2V
2 series · 2 of 2 positions shown · non-contrast
Comparison: PA and lateral chest 08/17/2016 and 07/07/2015.

CLINICAL DATA: Wheezing for 1 week.  History of COPD.

EXAM:
CHEST - 2 VIEW

[chest pa]
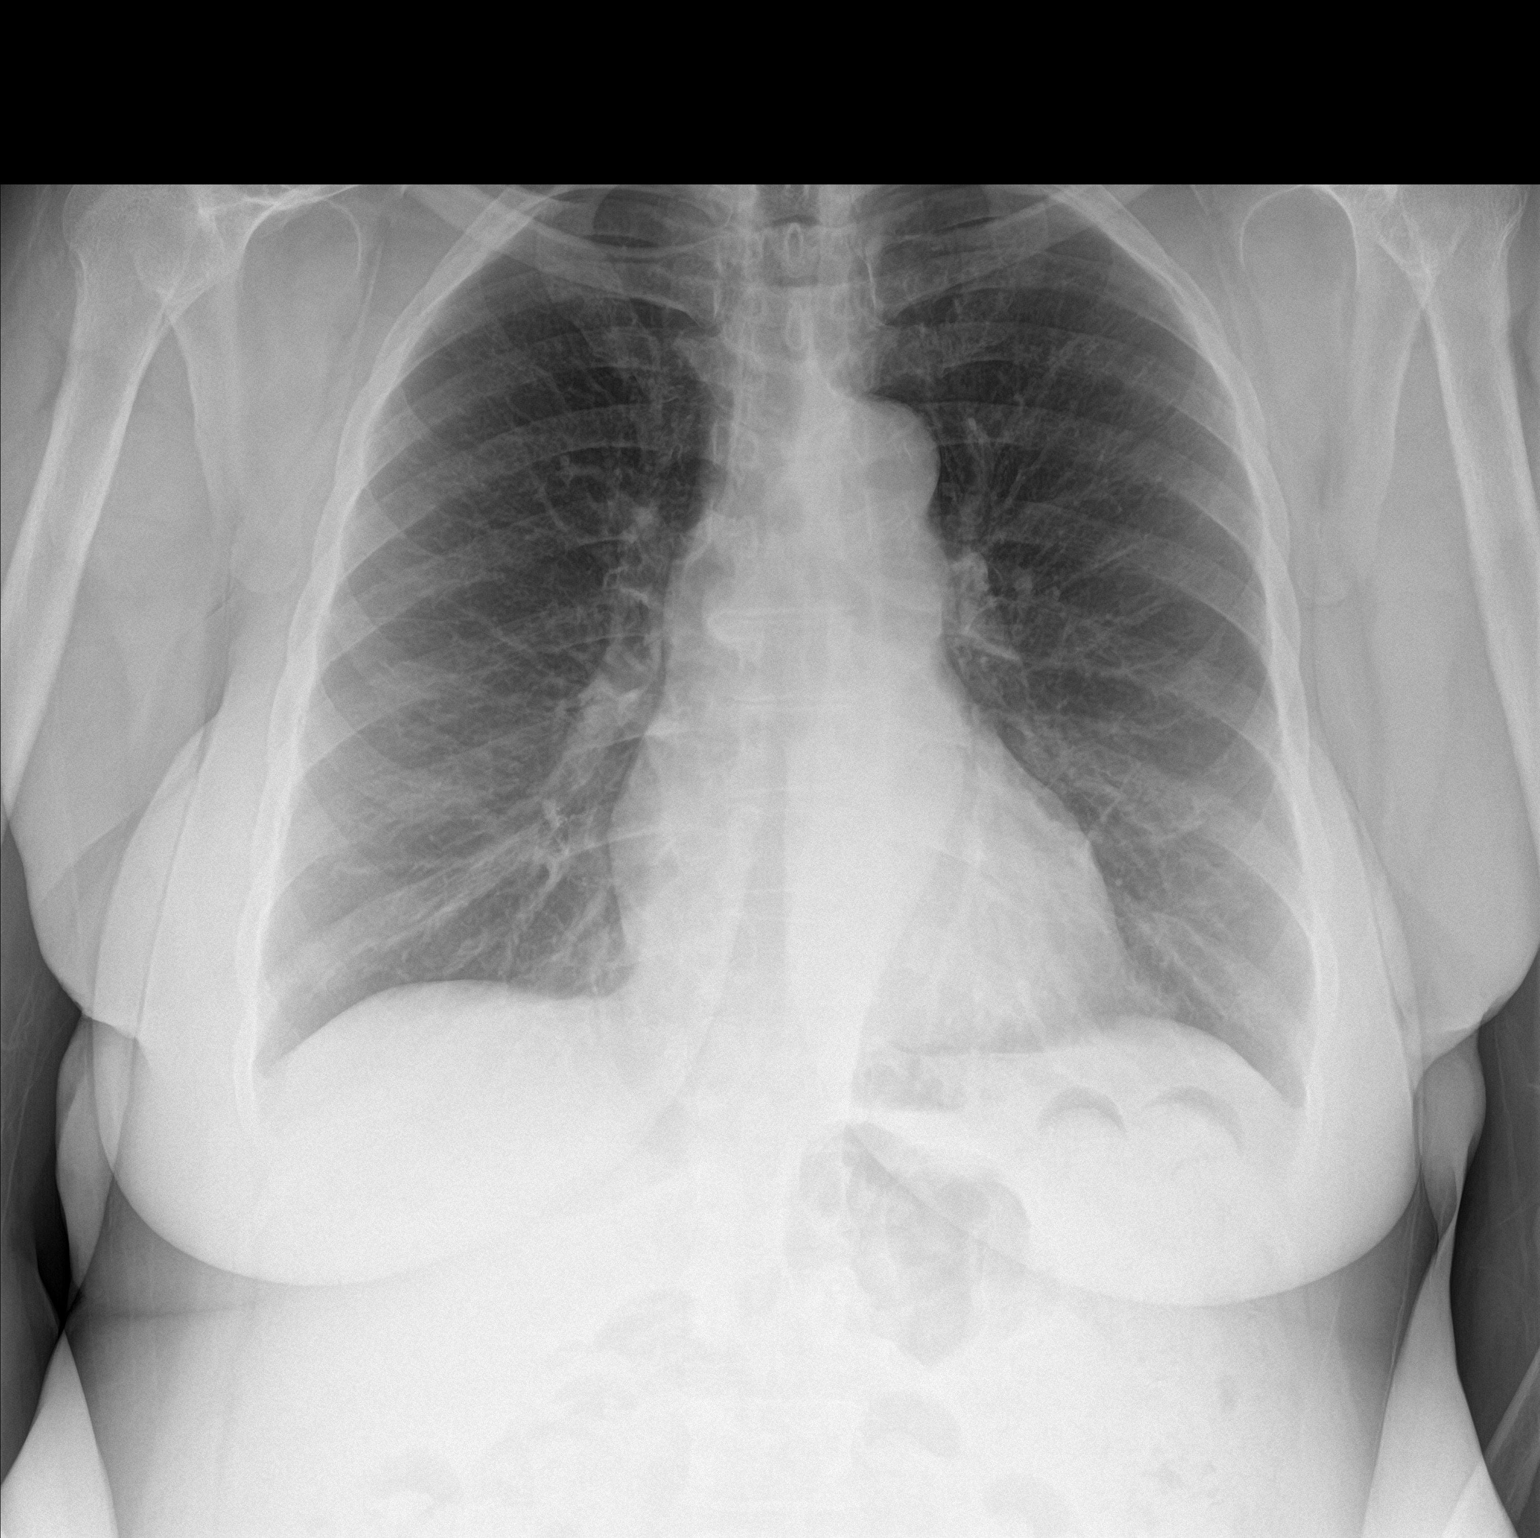

[chest lat]
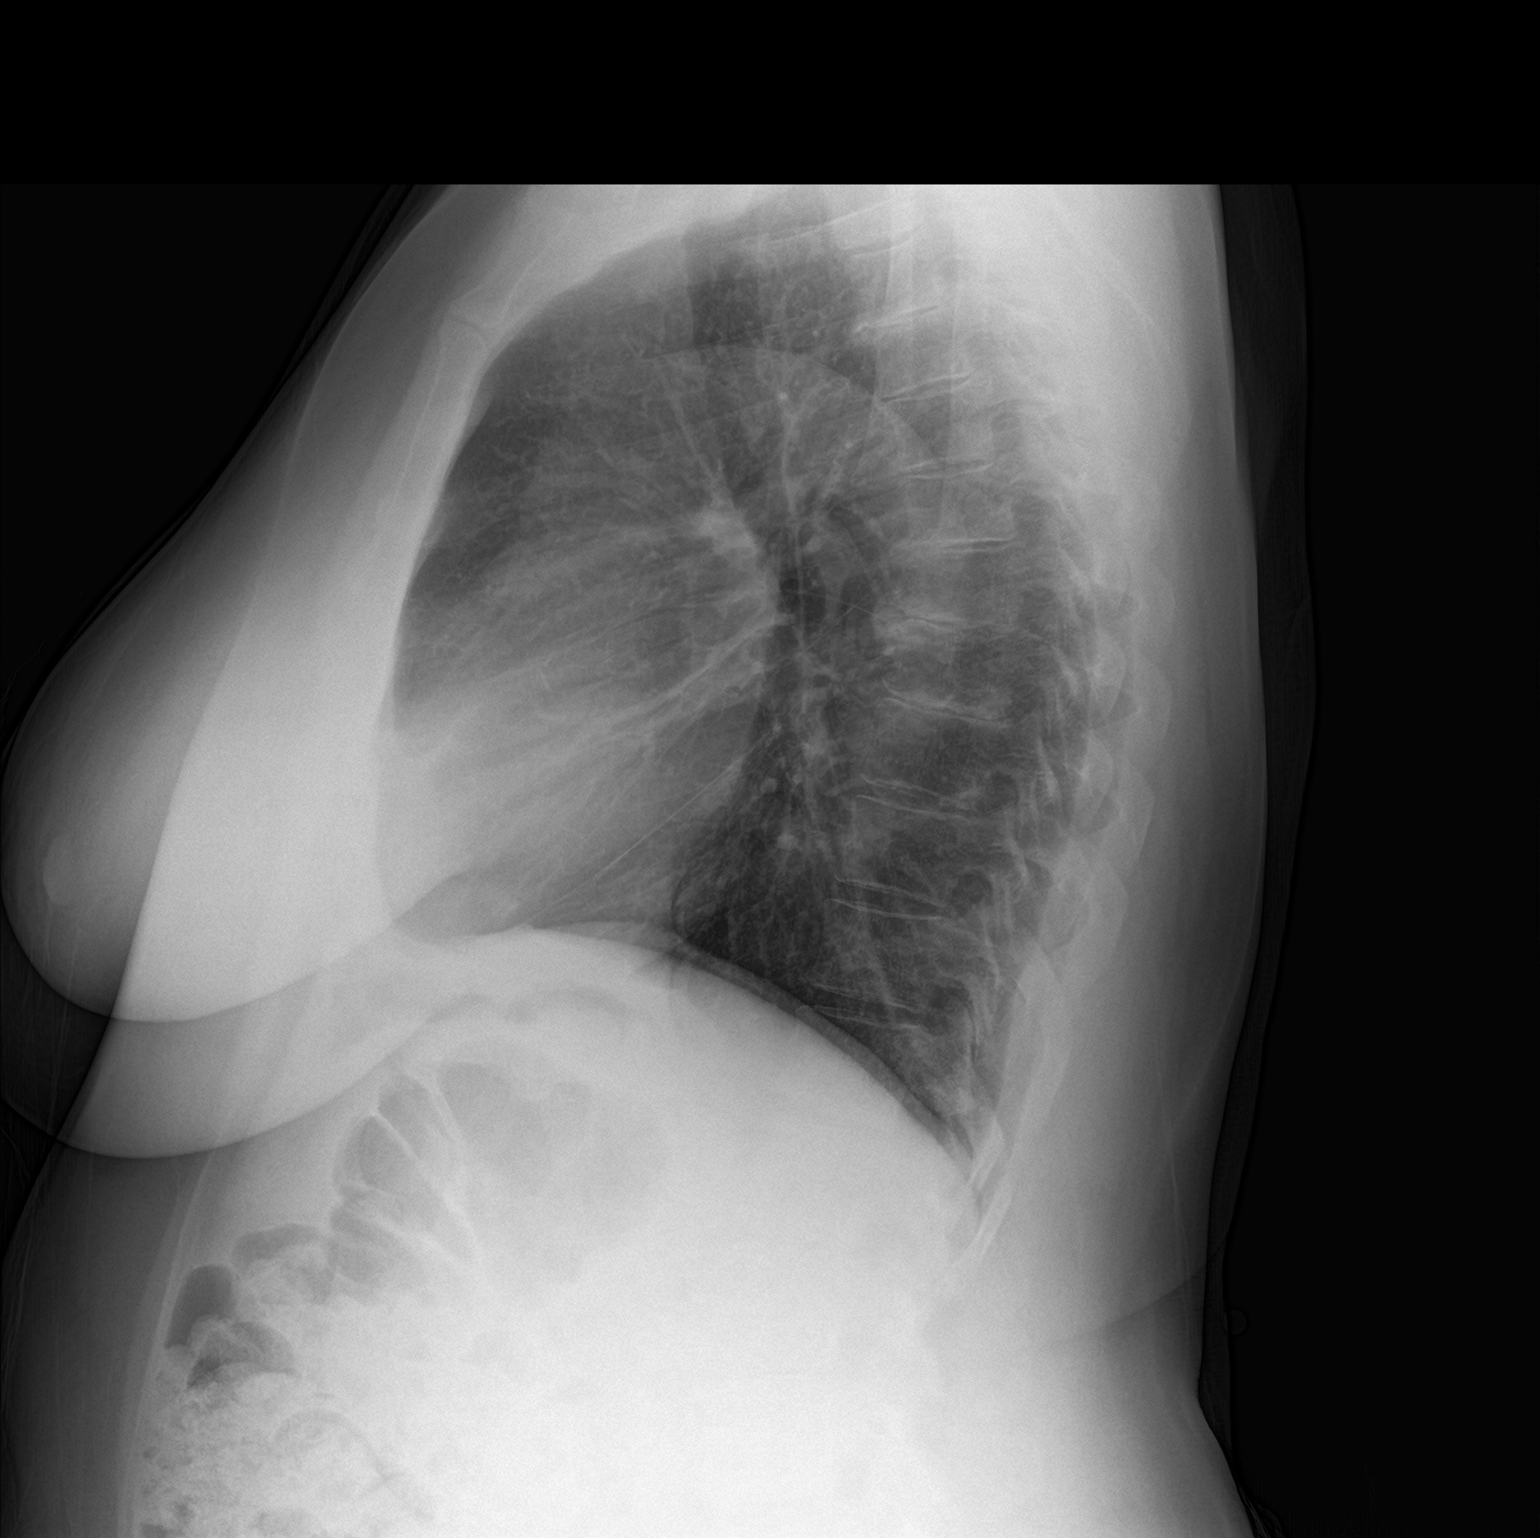

[2 of 2 positions shown; findings below may reference images not displayed]

FINDINGS: The lungs are clear. Heart size is normal. There is no pneumothorax
or pleural effusion. No bony abnormality.
IMPRESSION: Negative chest.

## 2020-05-14 IMAGING — DX DG CHEST 2V
2 series · 2 of 2 positions shown · non-contrast
Comparison: Chest x-ray of December 14, 2017

CLINICAL DATA: Cough, sore throat, and mild shortness of breath
beginning last night. History of asthma-COPD, current smoker.

EXAM:
CHEST - 2 VIEW

[chest pa]
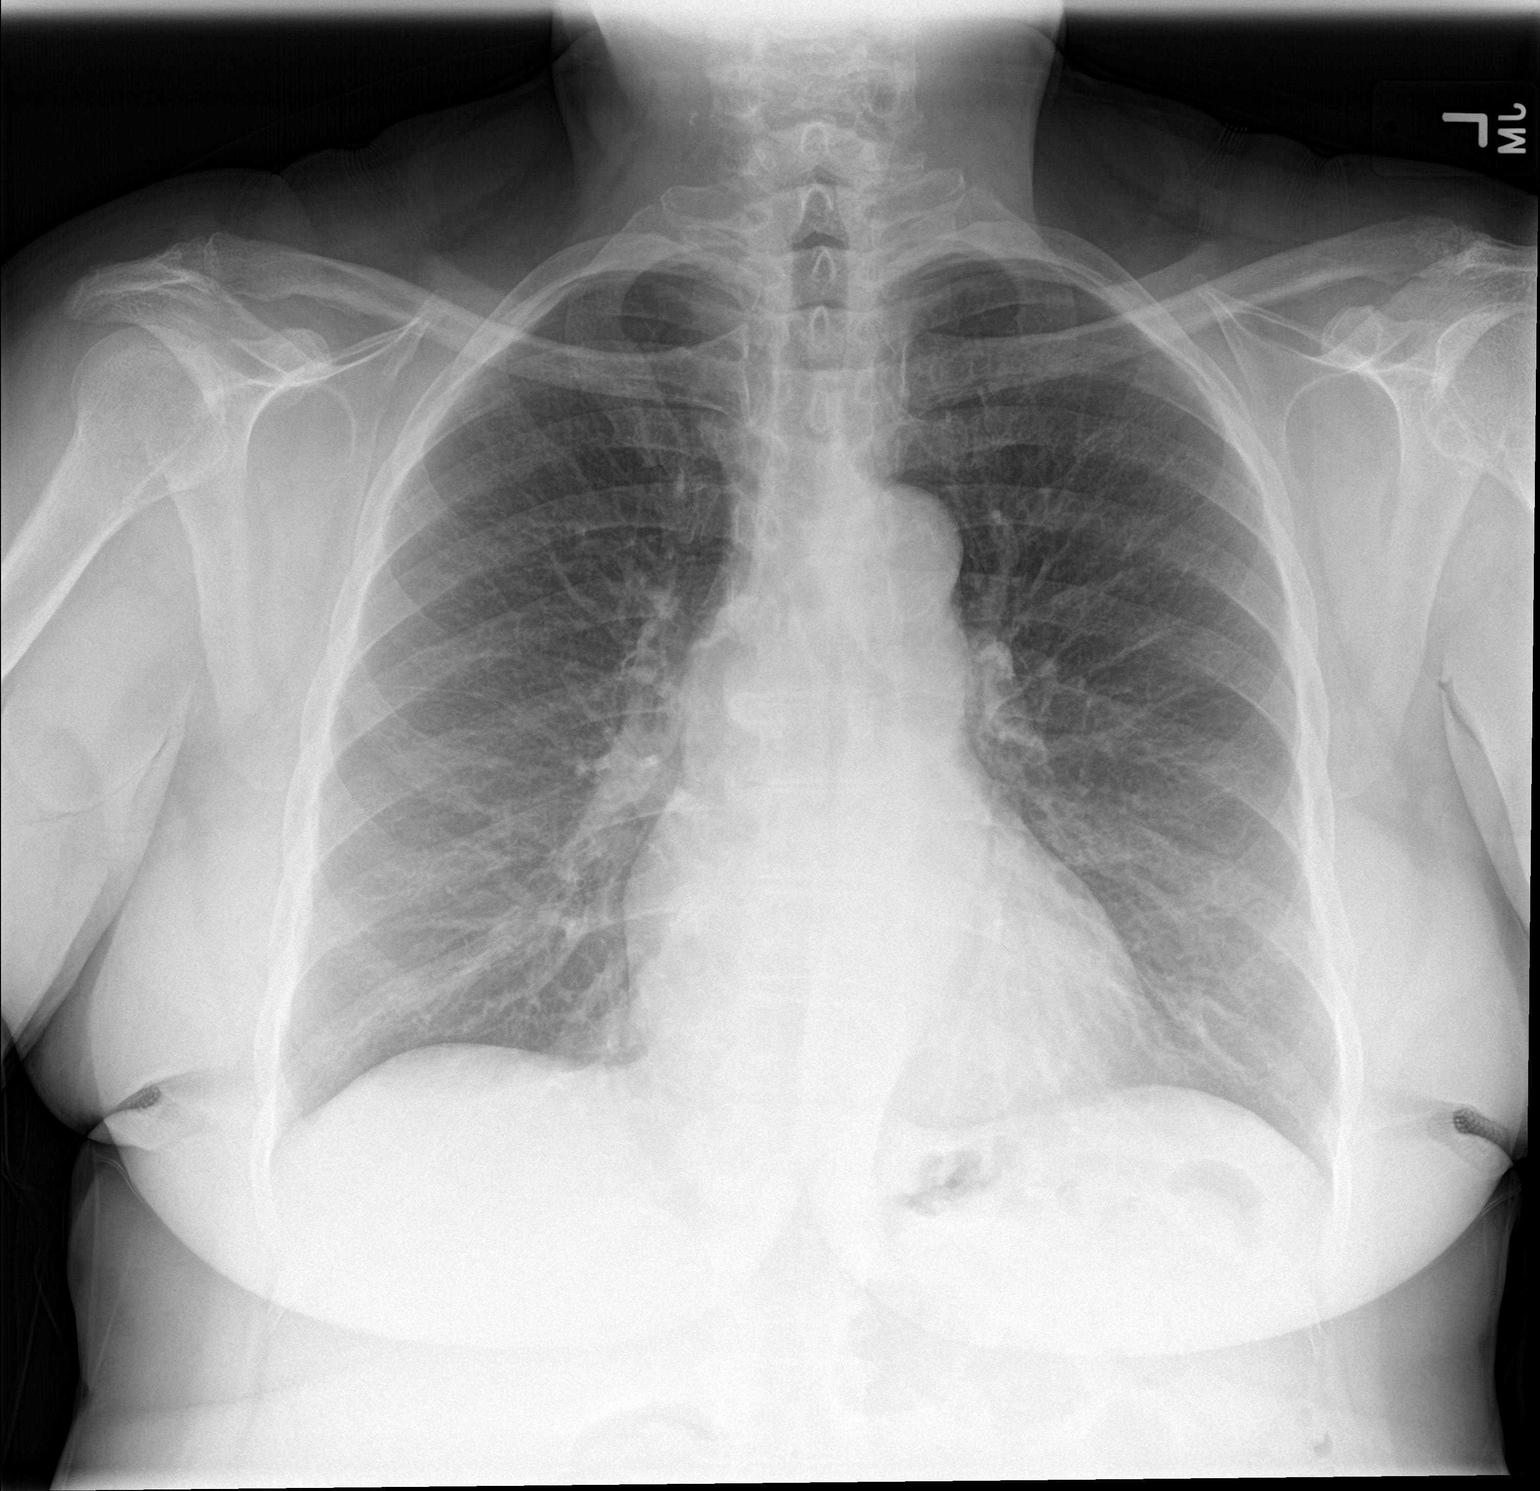

[chest lat]
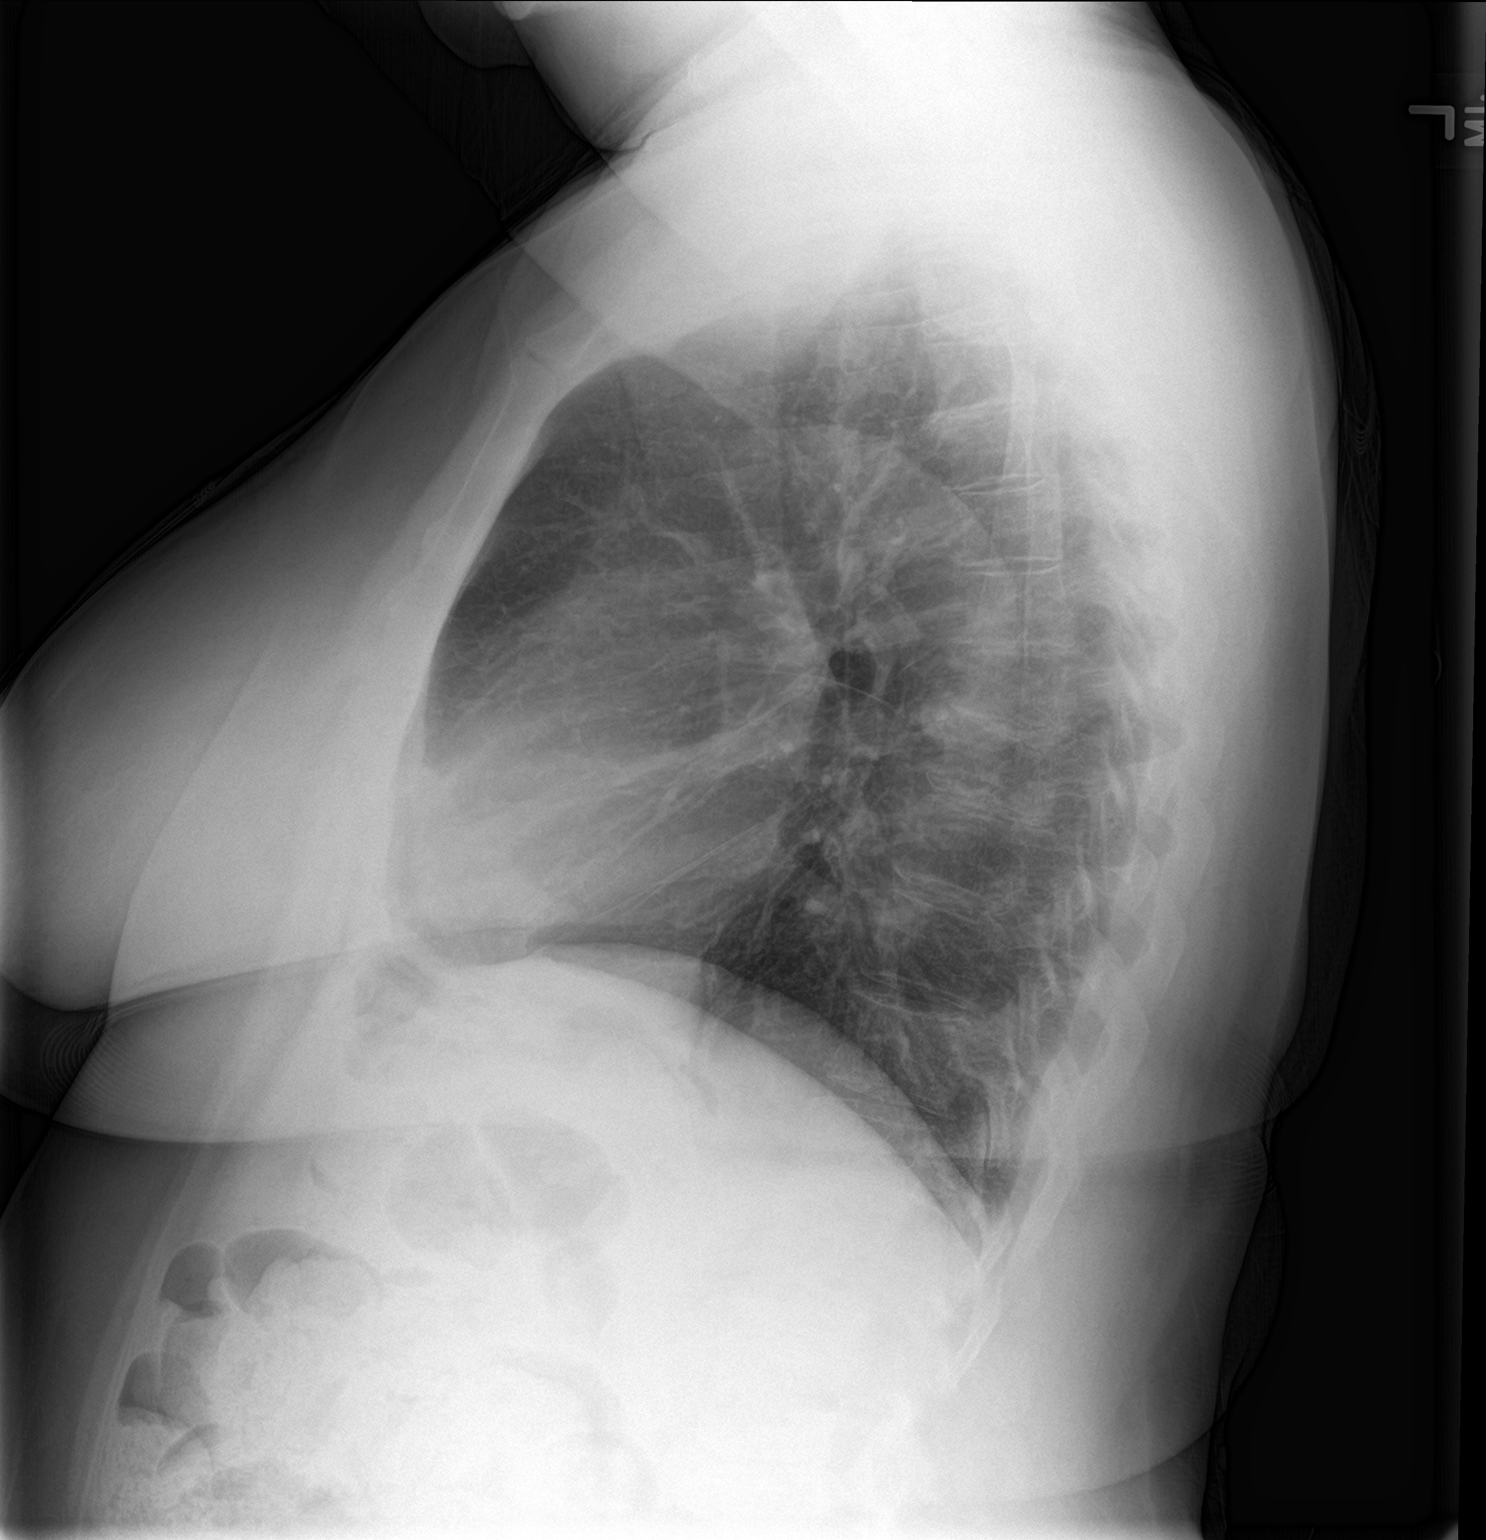

[2 of 2 positions shown; findings below may reference images not displayed]

FINDINGS: The lungs are adequately inflated. There is no focal infiltrate.
There is no pleural effusion. The heart and pulmonary vascularity
are normal. The mediastinum is normal in width. There is
calcification in the wall of the aortic arch. The bony thorax is
unremarkable.
IMPRESSION: There is no active cardiopulmonary disease.

Thoracic aortic atherosclerosis.

## 2020-10-04 ENCOUNTER — Encounter (HOSPITAL_BASED_OUTPATIENT_CLINIC_OR_DEPARTMENT_OTHER): Payer: Self-pay | Admitting: Emergency Medicine

## 2020-10-04 ENCOUNTER — Emergency Department (HOSPITAL_BASED_OUTPATIENT_CLINIC_OR_DEPARTMENT_OTHER): Payer: Medicare (Managed Care)

## 2020-10-04 ENCOUNTER — Other Ambulatory Visit: Payer: Self-pay

## 2020-10-04 ENCOUNTER — Emergency Department (HOSPITAL_BASED_OUTPATIENT_CLINIC_OR_DEPARTMENT_OTHER)
Admission: EM | Admit: 2020-10-04 | Discharge: 2020-10-04 | Disposition: A | Discharge status: Home / Self Care | Payer: Medicare (Managed Care) | Attending: Emergency Medicine | Admitting: Emergency Medicine

## 2020-10-04 DIAGNOSIS — Z79899 Other long term (current) drug therapy: Secondary | ICD-10-CM | POA: Insufficient documentation

## 2020-10-04 DIAGNOSIS — J441 Chronic obstructive pulmonary disease with (acute) exacerbation: Secondary | ICD-10-CM | POA: Diagnosis not present

## 2020-10-04 DIAGNOSIS — Z20822 Contact with and (suspected) exposure to covid-19: Secondary | ICD-10-CM | POA: Diagnosis not present

## 2020-10-04 DIAGNOSIS — Z7984 Long term (current) use of oral hypoglycemic drugs: Secondary | ICD-10-CM | POA: Diagnosis not present

## 2020-10-04 DIAGNOSIS — Z7982 Long term (current) use of aspirin: Secondary | ICD-10-CM | POA: Insufficient documentation

## 2020-10-04 DIAGNOSIS — I10 Essential (primary) hypertension: Secondary | ICD-10-CM | POA: Insufficient documentation

## 2020-10-04 DIAGNOSIS — F17210 Nicotine dependence, cigarettes, uncomplicated: Secondary | ICD-10-CM | POA: Diagnosis not present

## 2020-10-04 DIAGNOSIS — E119 Type 2 diabetes mellitus without complications: Secondary | ICD-10-CM | POA: Diagnosis not present

## 2020-10-04 DIAGNOSIS — R0602 Shortness of breath: Secondary | ICD-10-CM | POA: Diagnosis present

## 2020-10-04 DIAGNOSIS — J45909 Unspecified asthma, uncomplicated: Secondary | ICD-10-CM | POA: Diagnosis not present

## 2020-10-04 LAB — BASIC METABOLIC PANEL
Anion gap: 10 (ref 5–15)
BUN: 12 mg/dL (ref 8–23)
CO2: 28 mmol/L (ref 22–32)
Calcium: 8.9 mg/dL (ref 8.9–10.3)
Chloride: 100 mmol/L (ref 98–111)
Creatinine, Ser: 0.83 mg/dL (ref 0.44–1.00)
GFR, Estimated: >60 mL/min (ref >60)
Glucose, Bld: 146 mg/dL — ABNORMAL HIGH (ref 70–99)
Potassium: 3.2 mmol/L — ABNORMAL LOW (ref 3.5–5.1)
Sodium: 138 mmol/L (ref 135–145)

## 2020-10-04 LAB — CBC WITH DIFFERENTIAL/PLATELET
Abs Immature Granulocytes: 0.04 10*3/uL (ref 0.00–0.07)
Basophils Absolute: 0 10*3/uL (ref 0.0–0.1)
Basophils Relative: 0 %
Eosinophils Absolute: 0.3 10*3/uL (ref 0.0–0.5)
Eosinophils Relative: 3 %
HCT: 39.2 % (ref 36.0–46.0)
Hemoglobin: 12.4 g/dL (ref 12.0–15.0)
Immature Granulocytes: 0 %
Lymphocytes Relative: 22 %
Lymphs Abs: 2.1 10*3/uL (ref 0.7–4.0)
MCH: 26.8 pg (ref 26.0–34.0)
MCHC: 31.6 g/dL (ref 30.0–36.0)
MCV: 84.7 fL (ref 80.0–100.0)
Monocytes Absolute: 0.8 10*3/uL (ref 0.1–1.0)
Monocytes Relative: 8 %
Neutro Abs: 6.3 10*3/uL (ref 1.7–7.7)
Neutrophils Relative %: 67 %
Platelets: 257 10*3/uL (ref 150–400)
RBC: 4.63 MIL/uL (ref 3.87–5.11)
RDW: 15.5 % (ref 11.5–15.5)
WBC: 9.6 10*3/uL (ref 4.0–10.5)
nRBC: 0 % (ref 0.0–0.2)

## 2020-10-04 LAB — SARS CORONAVIRUS 2 (TAT 6-24 HRS): SARS Coronavirus 2: NEGATIVE

## 2020-10-04 MED ORDER — PREDNISONE 50 MG PO TABS
60.0000 mg | ORAL_TABLET | Freq: Once | ORAL | Status: AC
  Administered 2020-10-04: 60 mg via ORAL
  Filled 2020-10-04: qty 1

## 2020-10-04 MED ORDER — POTASSIUM CHLORIDE CRYS ER 20 MEQ PO TBCR
40.0000 meq | EXTENDED_RELEASE_TABLET | Freq: Once | ORAL | Status: AC
  Administered 2020-10-04: 40 meq via ORAL
  Filled 2020-10-04: qty 2

## 2020-10-04 MED ORDER — PREDNISONE 10 MG PO TABS: 40.0000 mg | ORAL_TABLET | Freq: Every day | ORAL | 0 refills | Status: AC

## 2020-10-04 MED ORDER — ALBUTEROL SULFATE HFA 108 (90 BASE) MCG/ACT IN AERS
6.0000 | INHALATION_SPRAY | Freq: Once | RESPIRATORY_TRACT | Status: AC
  Administered 2020-10-04: 6 via RESPIRATORY_TRACT
  Filled 2020-10-04: qty 6.7

## 2020-10-04 NOTE — ED Notes (Signed)
States," I was doing my granddaughter's hair on Friday and I think I breathed in those fumes" has been SOB and coughing since then, denies pain.

## 2020-10-04 NOTE — ED Provider Notes (Signed)
[facility] EMERGENCY DEPARTMENT Provider Note   CSN: [id number] Arrival date & time: 10/04/20  [id number]     History Chief Complaint  Patient presents with  . Shortness of Breath    [name] is a 70 y.o. female.  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 Arthritis   . Asthma   . COPD (chronic obstructive pulmonary disease) (HCC)   . Diabetes mellitus   . Heart murmur   . High cholesterol   . Hyperlipidemia   . Hypertension   . Kidney infection   . Kidney stone   . Reflux     Patient Active Problem List   Diagnosis Date Noted  . GERD (gastroesophageal reflux disease) 06/25/2018  . Tobacco abuse 09/06/2012  . Hyperlipidemia 09/02/2012  . DM (diabetes mellitus) (HCC) 09/02/2012  . COPD (chronic obstructive pulmonary disease) gold stage B with ongoing tobacco use 09/02/2012  . Arthritis 09/02/2012  . Hypertension 06/01/2011    Past Surgical History:  Procedure Laterality Date  . fillopian tube removal    . right breast cyst removal       OB History   No obstetric history on file.     Family History  Problem Relation Age of Onset  . CVA Other   . Hypertension Other   . Diabetes Mellitus II Other     Social History   Tobacco Use  . Smoking status: Current Every Day Smoker    Packs/day: 0.50    Years: 52.00    Pack years: 26.00    Types: Cigarettes    Start date: 09/26/1966  . Smokeless tobacco: Current User    Types: Chew  .  Tobacco comment: Started smoking at age 25.  Currently smoking 2-3 cig per day  Vaping Use  . Vaping Use: Never used  Substance Use Topics  . Alcohol use: Yes    Alcohol/week: 0.0 standard drinks    Comment: Socially only  . Drug use: No    Home Medications Prior to Admission medications   Medication Sig Start Date End Date Taking? Authorizing Provider  albuterol (PROVENTIL) (2.5 MG/3ML) 0.083% nebulizer solution Take 3 mLs (2.5 mg total) by nebulization every 6 (six) hours as needed for wheezing or shortness of breath. 08/17/16  Yes Mesner, Barbara Cower, MD  amLODipine (NORVASC) 10 MG tablet Take 10 mg by mouth daily.   Yes [provider]  aspirin EC 81 MG tablet Take 81 mg by mouth daily.   Yes [provider]  atorvastatin (LIPITOR) 20 MG tablet  05/17/17  Yes [provider]  Fluticasone-Salmeterol (ADVAIR DISKUS) 500-50 MCG/DOSE AEPB Inhale 1 puff into the lungs 2 (two) times daily. 04/12/18  Yes Parrett, Tammy S, NP  metFORMIN (GLUCOPHAGE) 500 MG tablet Take 500 mg by mouth 2 (two) times daily with a meal.   Yes [provider]  predniSONE (DELTASONE) 10 MG tablet Take 4 tablets (40 mg total) by mouth daily for 4 days. 10/04/20 10/08/20 Yes Skip Litke,  Denny Peon, MD  ranitidine (ZANTAC) 150 MG tablet Take 1 tablet (150 mg total) by mouth 2 (two) times daily. Patient not taking: No sig reported 06/25/18 10/04/20  Parrett, Virgel Bouquet, NP    Allergies    Azithromycin, Benazepril, Cephalexin, Benadryl [diphenhydramine hcl], Nexium [esomeprazole magnesium], and Spiriva [tiotropium bromide monohydrate]  Review of Systems   Review of Systems  Constitutional: Positive for appetite change and chills. Negative for fatigue and fever.  HENT: Negative for congestion and sore throat.   Eyes: Negative for visual disturbance.  Respiratory: Positive for cough and shortness of breath.   Cardiovascular: Negative for chest pain.  Gastrointestinal: Negative for abdominal pain,  diarrhea, nausea and vomiting.  Genitourinary: Negative for dysuria.  Musculoskeletal: Negative for arthralgias and myalgias.  Skin: Negative for rash.  Neurological: Negative for light-headedness and headaches.    Physical Exam Updated Vital Signs BP 130/75   Pulse 60   Temp 99.1 F (37.3 C) (Oral)   Resp 13   Ht 5\' 5"  (1.651 m)   Wt 83.9 kg   SpO2 94%   BMI 30.79 kg/m   Physical Exam Vitals and nursing note reviewed.  Constitutional:      General: She is not in acute distress.    Appearance: She is well-developed and well-nourished. She is not diaphoretic.  HENT:     Head: Normocephalic and atraumatic.  Eyes:     Extraocular Movements: EOM normal.     Conjunctiva/sclera: Conjunctivae normal.  Cardiovascular:     Rate and Rhythm: Normal rate and regular rhythm.     Pulses: Intact distal pulses.     Heart sounds: Normal heart sounds. No murmur heard. No friction rub. No gallop.   Pulmonary:     Effort: Pulmonary effort is normal. No respiratory distress.     Breath sounds: Wheezing present. No rales.  Abdominal:     General: There is no distension.     Palpations: Abdomen is soft.     Tenderness: There is no abdominal tenderness. There is no guarding.  Musculoskeletal:        General: No tenderness or edema.     Cervical back: Normal range of motion.  Skin:    General: Skin is warm and dry.     Findings: No erythema or rash.  Neurological:     Mental Status: She is alert and oriented to person, place, and time.     ED Results / Procedures / Treatments   Labs (all labs ordered are listed, but only abnormal results are displayed) Labs Reviewed  BASIC METABOLIC PANEL - Abnormal; Notable for the following components:      Result Value   Potassium 3.2 (*)    Glucose, Bld 146 (*)    All other components within normal limits  SARS CORONAVIRUS 2 (TAT 6-24 HRS)  CBC WITH DIFFERENTIAL/PLATELET    EKG EKG Interpretation  Date/Time:  Sunday October 04 2020  08:35:19 EST Ventricular Rate:  89 PR Interval:    QRS Duration: 74 QT Interval:  368 QTC Calculation: 448 R Axis:   70 Text Interpretation: Sinus rhythm Borderline T wave abnormalities Since prior ECG, rate has increased, nonspecific TW changes present Confirmed by [id number] (Alvira Monday) on 10/04/2020 8:48:10 AM   Radiology DG Chest Portable 1 View  Result Date: 10/04/2020 CLINICAL DATA:  Shortness of breath EXAM: PORTABLE CHEST 1 VIEW COMPARISON:  May 13, 2020 FINDINGS: The cardiomediastinal silhouette is unchanged in contour.Mildly tortuous thoracic aorta. No pleural effusion. No pneumothorax. No acute  pleuroparenchymal abnormality. Visualized abdomen is unremarkable. Mild degenerative changes of the thoracic spine. IMPRESSION: No acute cardiopulmonary abnormality. Electronically Signed   By: Meda Klinefelter MD   On: 10/04/2020 09:23    Procedures Procedures (including critical care time)  Medications Ordered in ED Medications  predniSONE (DELTASONE) tablet 60 mg (60 mg Oral Given 10/04/20 0925)  albuterol (VENTOLIN HFA) 108 (90 Base) MCG/ACT inhaler 6 puff (6 puffs Inhalation Given 10/04/20 0907)  potassium chloride SA (KLOR-CON) CR tablet 40 mEq (40 mEq Oral Given 10/04/20 1033)    ED Course  I have reviewed the triage vital signs and the nursing notes.  Pertinent labs & imaging results that were available during my care of the patient were reviewed by me and considered in my medical decision making (see chart for details).    MDM Rules/Calculators/A&P                          70 year old female with a history of DM, htn, hlpd, COPD, smoking who presents with concern for shortness of breath.  Differential diagnosis for dyspnea includes ACS, PE, COPD exacerbation, CHF exacerbation, anemia, pneumonia, viral etiology such as COVID 19 infection, metabolic abnormality.  Chest x-ray was done which showed no sign of pneumonia, edema or pneumothorax. EKG was evaluated by me which  showed no acute abnormalities.  No CP, no clinical signs of CHF.  No asymmetric leg swelling, tachycardia or hypoxia and lower clinical suspicion for PE at this time.  Exam and history consistent with COPD exacerbation, possibly triggered by smoke from hair straightening, or possibly secondary to viral infection including COVID 19.   Given steroid, albuterol in ED.  Normal ambulatory saturations esp considering hx COPD.  Do not see indication for admission.    Given rx for prednisone, recommend continued albuterol use, PCP follow up> COVID pending at time of discharge is negative at this time.      Final Clinical Impression(s) / ED Diagnoses Final diagnoses:  COPD exacerbation (HCC)  COVID-19 virus RNA test result unknown    Rx / DC Orders ED Discharge Orders         Ordered    predniSONE (DELTASONE) 10 MG tablet  Daily        10/04/20 1015           Alvira Monday, MD 10/05/20 [phone]

## 2020-10-04 NOTE — ED Notes (Signed)
RT at bedside.

## 2020-10-04 NOTE — ED Triage Notes (Signed)
Pt arrives ambulatory to ED with c/o SOB and cough. Pt ambulated to room with oxygen dropping from 95 to 92% room air with ambulation. Pt reports symptoms started Friday pt reports being full vaccinated with booster. Also reports history of Asthma, has been using inhalers at home.

## 2020-10-04 NOTE — ED Notes (Signed)
Given po fluids 

## 2020-10-04 NOTE — ED Notes (Signed)
ED Provider at bedside. 

## 2021-03-20 ENCOUNTER — Ambulatory Visit (HOSPITAL_COMMUNITY)
Admission: EM | Admit: 2021-03-20 | Discharge: 2021-03-20 | Disposition: A | Discharge status: Home / Self Care | Payer: Medicare Other | Attending: Medical Oncology | Admitting: Medical Oncology

## 2021-03-20 ENCOUNTER — Encounter (HOSPITAL_COMMUNITY): Payer: Self-pay | Admitting: Medical Oncology

## 2021-03-20 ENCOUNTER — Other Ambulatory Visit: Payer: Self-pay

## 2021-03-20 ENCOUNTER — Observation Stay (HOSPITAL_COMMUNITY)
Admission: EM | Admit: 2021-03-20 | Discharge: 2021-03-21 | Disposition: A | Discharge status: Home / Self Care | Payer: Medicare Other | Attending: Internal Medicine | Admitting: Internal Medicine

## 2021-03-20 ENCOUNTER — Encounter (HOSPITAL_COMMUNITY): Payer: Self-pay

## 2021-03-20 DIAGNOSIS — E785 Hyperlipidemia, unspecified: Secondary | ICD-10-CM | POA: Diagnosis present

## 2021-03-20 DIAGNOSIS — Z7984 Long term (current) use of oral hypoglycemic drugs: Secondary | ICD-10-CM | POA: Diagnosis not present

## 2021-03-20 DIAGNOSIS — T783XXA Angioneurotic edema, initial encounter: Secondary | ICD-10-CM | POA: Diagnosis not present

## 2021-03-20 DIAGNOSIS — J449 Chronic obstructive pulmonary disease, unspecified: Secondary | ICD-10-CM | POA: Diagnosis not present

## 2021-03-20 DIAGNOSIS — J45909 Unspecified asthma, uncomplicated: Secondary | ICD-10-CM | POA: Insufficient documentation

## 2021-03-20 DIAGNOSIS — Z7982 Long term (current) use of aspirin: Secondary | ICD-10-CM | POA: Diagnosis not present

## 2021-03-20 DIAGNOSIS — I10 Essential (primary) hypertension: Secondary | ICD-10-CM | POA: Diagnosis present

## 2021-03-20 DIAGNOSIS — E119 Type 2 diabetes mellitus without complications: Secondary | ICD-10-CM

## 2021-03-20 DIAGNOSIS — Z79899 Other long term (current) drug therapy: Secondary | ICD-10-CM | POA: Diagnosis not present

## 2021-03-20 DIAGNOSIS — Z20822 Contact with and (suspected) exposure to covid-19: Secondary | ICD-10-CM | POA: Diagnosis not present

## 2021-03-20 DIAGNOSIS — M069 Rheumatoid arthritis, unspecified: Secondary | ICD-10-CM | POA: Diagnosis present

## 2021-03-20 DIAGNOSIS — K219 Gastro-esophageal reflux disease without esophagitis: Secondary | ICD-10-CM | POA: Diagnosis present

## 2021-03-20 DIAGNOSIS — T7840XA Allergy, unspecified, initial encounter: Secondary | ICD-10-CM | POA: Diagnosis present

## 2021-03-20 DIAGNOSIS — F17210 Nicotine dependence, cigarettes, uncomplicated: Secondary | ICD-10-CM | POA: Insufficient documentation

## 2021-03-20 DIAGNOSIS — Z720 Tobacco use: Secondary | ICD-10-CM | POA: Diagnosis present

## 2021-03-20 LAB — GLUCOSE, CAPILLARY: Glucose-Capillary: 232 mg/dL — ABNORMAL HIGH (ref 70–99)

## 2021-03-20 MED ORDER — SODIUM CHLORIDE 0.9% FLUSH
3.0000 mL | Freq: Two times a day (BID) | INTRAVENOUS | Status: DC
  Administered 2021-03-20: 3 mL via INTRAVENOUS

## 2021-03-20 MED ORDER — INSULIN ASPART 100 UNIT/ML IJ SOLN
0.0000 [IU] | Freq: Three times a day (TID) | INTRAMUSCULAR | Status: DC
  Administered 2021-03-21: 5 [IU] via SUBCUTANEOUS

## 2021-03-20 MED ORDER — METHYLPREDNISOLONE SODIUM SUCC 125 MG IJ SOLR: 60.0000 mg | INTRAMUSCULAR | Status: DC

## 2021-03-20 MED ORDER — ALBUTEROL SULFATE (2.5 MG/3ML) 0.083% IN NEBU: 2.5000 mg | INHALATION_SOLUTION | Freq: Four times a day (QID) | RESPIRATORY_TRACT | Status: DC | PRN

## 2021-03-20 MED ORDER — NICOTINE 14 MG/24HR TD PT24
14.0000 mg | MEDICATED_PATCH | Freq: Every day | TRANSDERMAL | Status: DC
  Administered 2021-03-21: 14 mg via TRANSDERMAL
  Filled 2021-03-20 (×2): qty 1

## 2021-03-20 MED ORDER — ATORVASTATIN CALCIUM 40 MG PO TABS
40.0000 mg | ORAL_TABLET | Freq: Every day | ORAL | Status: DC
  Administered 2021-03-20 – 2021-03-21 (×2): 40 mg via ORAL
  Filled 2021-03-20 (×2): qty 1

## 2021-03-20 MED ORDER — FAMOTIDINE IN NACL 20-0.9 MG/50ML-% IV SOLN
20.0000 mg | Freq: Once | INTRAVENOUS | Status: AC
  Administered 2021-03-20: 20 mg via INTRAVENOUS
  Filled 2021-03-20: qty 50

## 2021-03-20 MED ORDER — ADULT MULTIVITAMIN W/MINERALS CH
1.0000 | ORAL_TABLET | Freq: Every day | ORAL | Status: DC
  Administered 2021-03-20 – 2021-03-21 (×2): 1 via ORAL
  Filled 2021-03-20 (×2): qty 1

## 2021-03-20 MED ORDER — DIPHENHYDRAMINE HCL 50 MG/ML IJ SOLN: 25.0000 mg | Freq: Three times a day (TID) | INTRAMUSCULAR | Status: DC | PRN

## 2021-03-20 MED ORDER — METHYLPREDNISOLONE SODIUM SUCC 125 MG IJ SOLR
125.0000 mg | Freq: Once | INTRAMUSCULAR | Status: AC
  Administered 2021-03-20: 125 mg via INTRAMUSCULAR

## 2021-03-20 MED ORDER — MOMETASONE FURO-FORMOTEROL FUM 100-5 MCG/ACT IN AERO
2.0000 | INHALATION_SPRAY | Freq: Two times a day (BID) | RESPIRATORY_TRACT | Status: DC
  Administered 2021-03-21: 2 via RESPIRATORY_TRACT
  Filled 2021-03-20: qty 8.8

## 2021-03-20 MED ORDER — LORATADINE 10 MG PO TABS
10.0000 mg | ORAL_TABLET | Freq: Every day | ORAL | Status: DC
  Administered 2021-03-21: 10 mg via ORAL
  Filled 2021-03-20: qty 1

## 2021-03-20 MED ORDER — METHYLPREDNISOLONE SODIUM SUCC 125 MG IJ SOLR
INTRAMUSCULAR | Status: AC
  Filled 2021-03-20: qty 2

## 2021-03-20 MED ORDER — IBUPROFEN 200 MG PO TABS: 200.0000 mg | ORAL_TABLET | Freq: Four times a day (QID) | ORAL | Status: DC | PRN

## 2021-03-20 MED ORDER — DIPHENHYDRAMINE HCL 50 MG/ML IJ SOLN
50.0000 mg | Freq: Once | INTRAMUSCULAR | Status: AC
  Administered 2021-03-20: 50 mg via INTRAVENOUS
  Filled 2021-03-20: qty 1

## 2021-03-20 MED ORDER — MULTIVITAMINS PO CAPS: 1.0000 | ORAL_CAPSULE | Freq: Every day | ORAL | Status: DC

## 2021-03-20 MED ORDER — EPINEPHRINE PF 1 MG/ML IJ SOLN
0.5000 mg | Freq: Once | INTRAMUSCULAR | Status: AC
  Administered 2021-03-20: 0.5 mg via INTRAMUSCULAR

## 2021-03-20 MED ORDER — ASPIRIN EC 81 MG PO TBEC
81.0000 mg | DELAYED_RELEASE_TABLET | Freq: Every day | ORAL | Status: DC
  Administered 2021-03-20 – 2021-03-21 (×2): 81 mg via ORAL
  Filled 2021-03-20 (×2): qty 1

## 2021-03-20 MED ORDER — ONDANSETRON HCL 4 MG/2ML IJ SOLN
4.0000 mg | Freq: Once | INTRAMUSCULAR | Status: AC
Start: 2021-03-20 — End: 2021-03-20
  Administered 2021-03-20: 4 mg via INTRAVENOUS
  Filled 2021-03-20: qty 2

## 2021-03-20 MED ORDER — ENOXAPARIN SODIUM 40 MG/0.4ML IJ SOSY
40.0000 mg | PREFILLED_SYRINGE | INTRAMUSCULAR | Status: DC
  Administered 2021-03-20: 40 mg via SUBCUTANEOUS
  Filled 2021-03-20: qty 0.4

## 2021-03-20 MED ORDER — FAMOTIDINE 20 MG PO TABS
20.0000 mg | ORAL_TABLET | Freq: Two times a day (BID) | ORAL | Status: DC
  Administered 2021-03-21: 20 mg via ORAL
  Filled 2021-03-20 (×2): qty 1

## 2021-03-20 MED ORDER — SODIUM CHLORIDE 0.9% FLUSH: 3.0000 mL | INTRAVENOUS | Status: DC | PRN

## 2021-03-20 MED ORDER — AMLODIPINE BESYLATE 10 MG PO TABS
10.0000 mg | ORAL_TABLET | Freq: Every day | ORAL | Status: DC
  Administered 2021-03-21: 10 mg via ORAL
  Filled 2021-03-20: qty 1

## 2021-03-20 MED ORDER — SODIUM CHLORIDE 0.9 % IV SOLN: 250.0000 mL | INTRAVENOUS | Status: DC | PRN

## 2021-03-20 NOTE — ED Triage Notes (Signed)
Pt reports noticed swelling after chewing tobacco.

## 2021-03-20 NOTE — ED Triage Notes (Addendum)
Pt arrived to ED via EMS from [facility] for allergic reaction. Pt presented to UC for swelling to tongue. UC started IV and gave 0.5mg  epi IM and 125mg  solumedrol. Pt reports nothing new except she had dental work on Thursday. VSS w/ EMS. Pt endorsing feeling like something is stuck in her throat. EMS reports pt is on norvasc.

## 2021-03-20 NOTE — ED Notes (Signed)
Swelling to tongue unchanged. Pt VSS and in NAD

## 2021-03-20 NOTE — ED Provider Notes (Signed)
[facility] EMERGENCY DEPARTMENT Provider Note   CSN: [id number] Arrival date & time: 03/20/21  1434     History Chief Complaint  Patient presents with   Allergic Reaction    [name] is a 70 y.o. female.  Pt presents to the ED today with an allergic reaction.  The pt started around noon.  Pt said she developed swelling to her tongue and feels like there is something stuck in her throat.  She initially went to UC and they gave her solumedrol 125 mg IM and 0.3 mg epi around 1400.  The pt is improved some since then.        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     Family History  Problem Relation Age of Onset   CVA Other    Hypertension Other    Diabetes Mellitus II Other     Social History   Tobacco Use   Smoking status: Every Day    Packs/day: 0.50    Years: 52.00    Pack years: 26.00    Types: Cigarettes    Start date: 09/26/1966   Smokeless tobacco: Current    Types: Chew   Tobacco comments:    Started smoking at age 10.  Currently smoking 2-3 cig per day  Vaping Use   Vaping Use: Never used  Substance Use Topics   Alcohol use: Yes    Alcohol/week: 0.0 standard drinks    Comment: Socially only   Drug  use: No    Home Medications Prior to Admission medications   Medication Sig Start Date End Date Taking? Authorizing Provider  albuterol (PROVENTIL) (2.5 MG/3ML) 0.083% nebulizer solution Take 3 mLs (2.5 mg total) by nebulization every 6 (six) hours as needed for wheezing or shortness of breath. 08/17/16   Mesner, Barbara Cower, MD  amLODipine (NORVASC) 10 MG tablet Take 10 mg by mouth daily.    [provider]  aspirin EC 81 MG tablet Take 81 mg by mouth daily.    [provider]  atorvastatin (LIPITOR) 20 MG tablet  05/17/17   [provider]  Fluticasone-Salmeterol (ADVAIR DISKUS) 500-50 MCG/DOSE AEPB Inhale 1 puff into the lungs 2 (two) times daily. 04/12/18   Parrett, Virgel Bouquet, NP  metFORMIN (GLUCOPHAGE) 500 MG tablet Take 500 mg by mouth 2 (two) times daily with a meal.    [provider]  ranitidine (ZANTAC) 150 MG tablet Take 1 tablet (150 mg total) by mouth 2 (two) times daily. Patient not taking: No sig reported 06/25/18 10/04/20  Parrett, Virgel Bouquet, NP    Allergies    Azithromycin, Benazepril,  Cephalexin, Benadryl [diphenhydramine hcl], Other, Nexium [esomeprazole magnesium], and Spiriva [tiotropium bromide monohydrate]  Review of Systems   Review of Systems  HENT:         Tongue swelling  All other systems reviewed and are negative.  Physical Exam Updated Vital Signs BP (!) 143/64   Pulse 65   Temp 98.5 F (36.9 C) (Oral)   Resp 14   Ht 5\' 5"  (1.651 m)   Wt 89.4 kg   SpO2 96%   BMI 32.78 kg/m   Physical Exam Vitals and nursing note reviewed.  Constitutional:      Appearance: Normal appearance.  HENT:     Head: Normocephalic and atraumatic.     Comments: Tongue swelling    Right Ear: External ear normal.     Left Ear: External ear normal.     Nose: Nose normal.  Eyes:     Extraocular Movements: Extraocular movements intact.     Conjunctiva/sclera: Conjunctivae normal.     Pupils: Pupils are equal, round, and reactive to light.   Cardiovascular:     Rate and Rhythm: Normal rate and regular rhythm.     Pulses: Normal pulses.     Heart sounds: Normal heart sounds.  Pulmonary:     Effort: Pulmonary effort is normal.     Breath sounds: Normal breath sounds.  Abdominal:     General: Abdomen is flat. Bowel sounds are normal.     Palpations: Abdomen is soft.  Musculoskeletal:        General: Normal range of motion.     Cervical back: Normal range of motion and neck supple.  Skin:    General: Skin is warm.     Capillary Refill: Capillary refill takes less than 2 seconds.  Neurological:     General: No focal deficit present.     Mental Status: She is alert and oriented to person, place, and time.  Psychiatric:        Mood and Affect: Mood normal.        Behavior: Behavior normal.        Thought Content: Thought content normal.        Judgment: Judgment normal.    ED Results / Procedures / Treatments   Labs (all labs ordered are listed, but only abnormal results are displayed) Labs Reviewed - No data to display  EKG None  Radiology No results found.  Procedures Procedures   Medications Ordered in ED Medications  famotidine (PEPCID) IVPB 20 mg premix (20 mg Intravenous New Bag/Given 03/20/21 1459)  ondansetron (ZOFRAN) injection 4 mg (4 mg Intravenous Given 03/20/21 1454)  diphenhydrAMINE (BENADRYL) injection 50 mg (50 mg Intravenous Given 03/20/21 1454)    ED Course  I have reviewed the triage vital signs and the nursing notes.  Pertinent labs & imaging results that were available during my care of the patient were reviewed by me and considered in my medical decision making (see chart for details).    MDM Rules/Calculators/A&P                          Pt given solumedrol/epi pta.  She is given pepcid and benadryl here.  She will need to be monitored for several hours.  Etiology unclear.  She is not on any ace inhibitors.  CRITICAL CARE Performed by: 03/22/21   Total critical care time:  30 minutes  Critical care time was exclusive of separately billable procedures and treating other patients.  Critical care was necessary to treat or prevent imminent or life-threatening deterioration.  Critical care was time spent personally by me on the following activities: development of treatment plan with patient and/or surrogate as well as nursing, discussions with consultants, evaluation of patient's response to treatment, examination of patient, obtaining history from patient or surrogate, ordering and performing treatments and interventions, ordering and review of laboratory studies, ordering and review of radiographic studies, pulse oximetry and re-evaluation of patient's condition.  Final Clinical Impression(s) / ED Diagnoses Final diagnoses:  Angioedema, initial encounter    Rx / DC Orders ED Discharge Orders     None        Jacalyn Lefevre, MD 03/20/21 1504

## 2021-03-20 NOTE — ED Notes (Signed)
Patient is being discharged from the Urgent Care and sent to the Emergency Department via EMS. Per Chales Salmon NP, patient is in need of higher level of care due to tongue and throat swelling. Patient is aware and verbalizes understanding of plan of care.  Vitals:   03/20/21 1404  BP: (!) 194/100  Pulse: 82  Resp: 20  SpO2: 96%

## 2021-03-20 NOTE — ED Notes (Signed)
Report called to ED  

## 2021-03-20 NOTE — ED Notes (Signed)
Attempted to call report x 4. 

## 2021-03-20 NOTE — ED Provider Notes (Signed)
Patient still has a fair amount of tongue swelling following treatment in the ED.  She has been monitored for about 4 hours.  She has not had any worsening but has not had a significant mount of improvement.  Given this, will admit for observation.  I spoke with Dr. Artis Flock who  will admit the patient.   Rolan Bucco, MD 03/20/21 [phone]

## 2021-03-20 NOTE — ED Notes (Addendum)
Attempted to call report x2

## 2021-03-20 NOTE — ED Notes (Signed)
Attempted report x 3.  

## 2021-03-20 NOTE — ED Triage Notes (Signed)
Pt with tongue swelling onset 2 hours ago as well as throat swelling. Smith NP at bedside immediately.

## 2021-03-20 NOTE — ED Notes (Signed)
Attempted report x1. 

## 2021-03-20 NOTE — H&P (Signed)
History and Physical    [name] TDD:[id number] DOB: 01-20-1951 DOA: 03/20/2021  PCP: Dr. Daphine Deutscher Consultants:  pulm: Dr. Caryn Bee ([facility]), rheumatologist: Gerrit Halls ([facility]) ophthalmology: Dr. Severiano Gilbert Patient coming from:  Home - lives alone.   Chief Complaint: allergic reaction and tongue swelling   HPI: [name] is a 70 y.o. female with medical history significant of HTN, HLD, DM2, COPD, GERD,RA and tobacco abuse who presented to UC after she had used some chewing tobacco and felt her tongue starting to swell. She ate fish in the AM, no known allergy to fish.  Around 11:30-12:00 is when she felt her throat swelling and her tongue swelling so they went to UC. She denies any shortness of breath or stridor. She could not talk and felt tightness in her chest. She has allergy to insects and is not on any ACE-I. Denies any known insect bites.  Denies any rash or itching or hives.  She feels like to his started after the chewing tobacco, but has used with no issues in the past.  Denies any headaches, chest pain, palpitations, fever/chills, coughing, stomach pain or N/V/D.   ED Course: on arrival: 194/100, HR: 82, RR: 20 and oxygen 96% on room air. Was given 125mg  of solumedrol and epi in UC and sent to ER. She received 50mg  of iV benadryl and 20mg  of IV pepcid. She had some improvement, but ER asked to admit for monitoring of her angioedema.    Review of Systems: As per HPI; otherwise review of systems reviewed and negative.   Ambulatory Status:  Ambulates without assistance  COVID Vaccine Status:  pfizer x2 and booster   Past Medical History:  Diagnosis Date   Arthritis    Asthma    COPD (chronic obstructive pulmonary disease) (HCC)    Diabetes mellitus    Heart murmur    High cholesterol    Hyperlipidemia    Hypertension    Kidney infection    Kidney stone    Reflux     Past Surgical History:  Procedure Laterality Date   fillopian tube removal     right breast cyst  removal      Social History   Socioeconomic History   Marital status: Single    Spouse name: Not on file   Number of children: Not on file   Years of education: Not on file   Highest education level: Not on file  Occupational History   Not on file  Tobacco Use   Smoking status: Every Day    Packs/day: 0.50    Years: 52.00    Pack years: 26.00    Types: Cigarettes    Start date: 09/26/1966   Smokeless tobacco: Current    Types: Chew   Tobacco comments:    Started smoking at age 76.  Currently smoking 2-3 cig per day  Vaping Use   Vaping Use: Never used  Substance and Sexual Activity   Alcohol use: Yes    Alcohol/week: 0.0 standard drinks    Comment: Socially only   Drug use: No   Sexual activity: Not on file  Other Topics Concern   Not on file  Social History Narrative   Not on file   Social Determinants of Health   Financial Resource Strain: Not on file  Food Insecurity: Not on file  Transportation Needs: Not on file  Physical Activity: Not on file  Stress: Not on file  Social Connections: Not on file  Intimate Partner Violence: Not  on file    Allergies  Allergen Reactions   Azithromycin Anaphylaxis and Swelling    Eyes swell   Benazepril Anaphylaxis    Other reaction(s): SWELLING   Cephalexin Anaphylaxis and Swelling   Benadryl [Diphenhydramine Hcl] Nausea And Vomiting   Other Other (See Comments)    Reports allergy to insects. Unsure of reaction.   Nexium [Esomeprazole Magnesium] Palpitations   Spiriva [Tiotropium Bromide Monohydrate] Palpitations    Family History  Problem Relation Age of Onset   CVA Other    Hypertension Other    Diabetes Mellitus II Other     Prior to Admission medications   Medication Sig Start Date End Date Taking? Authorizing Provider  albuterol (PROVENTIL) (2.5 MG/3ML) 0.083% nebulizer solution Take 3 mLs (2.5 mg total) by nebulization every 6 (six) hours as needed for wheezing or shortness of breath. 08/17/16  Yes Mesner,  Barbara Cower, MD  amLODipine (NORVASC) 10 MG tablet Take 10 mg by mouth daily.   Yes [provider]  aspirin EC 81 MG tablet Take 81 mg by mouth daily.   Yes [provider]  atorvastatin (LIPITOR) 40 MG tablet Take 40 mg by mouth daily. 05/17/17  Yes [provider]  famotidine (PEPCID) 20 MG tablet Take 20 mg by mouth 2 (two) times daily. 02/25/21  Yes [provider]  fluticasone-salmeterol (ADVAIR) 100-50 MCG/ACT AEPB Inhale 1 puff into the lungs 2 (two) times daily. 03/12/21  Yes [provider]  ibuprofen (ADVIL) 200 MG tablet Take 200 mg by mouth every 6 (six) hours as needed for mild pain.   Yes [provider]  Multiple Vitamin (MULTIVITAMIN) capsule Take 1 capsule by mouth daily.   Yes [provider]  metFORMIN (GLUCOPHAGE) 500 MG tablet Take 500 mg by mouth 2 (two) times daily with a meal. Patient not taking: Reported on 03/20/2021    [provider]  ranitidine (ZANTAC) 150 MG tablet Take 1 tablet (150 mg total) by mouth 2 (two) times daily. Patient not taking: No sig reported 06/25/18 10/04/20  Julio Sicks, NP    Physical Exam: Vitals:   03/20/21 1615 03/20/21 1630 03/20/21 1645 03/20/21 1700  BP: 125/66 125/64 124/71 (!) 142/71  Pulse: 60 (!) 59 (!) 58 65  Resp: Temp:      TempSrc:      SpO2: 93% 93% 91% 90%  Weight:      Height:         General:  Appears calm and comfortable and is in NAD Eyes:  PERRL, EOMI, normal lids, iris ENT:  grossly normal hearing, lips. Tongue is edematous. Speech is intelligible, but mildly muffled/thick.  mmm; appropriate dentition Neck:  no LAD, masses or thyromegaly; no carotid bruits Cardiovascular:  RRR, no m/r/g. No LE edema.  Respiratory:   CTA bilaterally with no wheezes/rales/rhonchi.  Normal respiratory effort. No stridors or increased work of breathing.  Abdomen:  soft, NT, ND, NABS Back:   normal alignment, no CVAT Skin:  no rash or induration seen on  limited exam. No hives/rashes  Musculoskeletal:  grossly normal tone BUE/BLE, good ROM, no bony abnormality Lower extremity:  No LE edema.  Limited foot exam with no ulcerations.  2+ distal pulses. Psychiatric:  grossly normal mood and affect, speech fluent and appropriate, AOx3 Neurologic:  CN 2-12 grossly intact, moves all extremities in coordinated fashion, sensation intact    Radiological Exams on Admission: Independently reviewed - see discussion in A/P where applicable  No  results found.  Labs on Admission: I have personally reviewed the available labs and imaging studies at the time of the admission.  Pertinent labs:   Pending    Assessment/Plan Principal Problem:   Angioedema -mild improvement with epi/steroid/pepcid and benadryl. Observe overnight -allergy to benadryl, but has had no n/v with IV. Will write prn.  -no ACE-I or known allergan. No hives or associated itching/rashes  -no stridor or increased work of breathing -place on tele -low threshold for hereditary angioedema, C4 pending -continue pepcid BID, claritin, IV steroid q 24 hours and can likely transition to oral if needed tomorrow -outpatient allergy visit and d/c with epi pen.   Active Problems:   Hypertension -on soft side. Will resume home norvasc tomorrow.     Hyperlipidemia -continue home lipitor     DM (diabetes mellitus) (HCC) -diet controlled -a1c pending -SSI/accuchecks.     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 no signs of exacerbation.     Rheumatoid arthritis (HCC) -f/u outpatient. On no medication     Tobacco abuse -nicotine patch and encouraged smoking cessation.    GERD (gastroesophageal reflux disease) -continue home pepcid BID     Body mass index is 32.78 kg/m.   Level of care: Telemetry Medical DVT prophylaxis:  Lovenox  Code Status:  Full - confirmed with patient Family  Communication: Called her daughter, Lakaya Tolen, no VM set up. [phone] Disposition Plan:  The patient is from: home   Anticipated d/c date will depend on clinical response to treatment, but possibly as early as tomorrow if she has excellent response to treatment   Consults called: none  Admission status:  observation    Orland Mustard MD [facility]   How to contact the [facility] Attending or Consulting provider 7A - 7P or covering provider during after hours 7P -7A, for this patient?  Check the care team in [facility] and look for a) attending/consulting [facility] provider listed and b) the [facility] team listed Log into [email or web address] and use [place]'s universal password to access. If you do not have the password, please contact the hospital operator. Locate the [facility] provider you are looking for under [facility] and page to a number that you can be directly reached. If you still have difficulty reaching the provider, please page the [facility] (Director on Call) for the Hospitalists listed on amion for assistance.   03/20/2021, 6:10 PM

## 2021-03-20 NOTE — Discharge Instructions (Addendum)
Patient transported to ED via EMS

## 2021-03-20 NOTE — ED Provider Notes (Addendum)
[facility]    CSN: [id number] Arrival date & time: 03/20/21  1400      History   Chief Complaint Chief Complaint  Patient presents with   Tongue and throat swelling    HPI [name] is a 70 y.o. female.   Patient here for evaluation of tongue and throat swelling that started approximately 2 hours ago.  Reports having dental work done recently and eating fish this am.  Also reports allergy to "insects" that requires an epipen but has not been bitten recently.  No bites noticed.  Patient with history of asthma and COPD as well as HTN and high cholesterol.  Denies any trauma, injury, or other precipitating event.  Denies any specific alleviating or aggravating factors.  Denies any fevers, chest pain, shortness of breath, N/V/D, numbness, tingling, weakness, abdominal pain, or headaches.     The history is provided by the patient.   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      Home Medications    Prior to Admission medications   Medication Sig Start Date End Date Taking? Authorizing Provider  albuterol (PROVENTIL) (2.5 MG/3ML) 0.083% nebulizer solution Take 3 mLs (2.5 mg total) by nebulization every 6 (six) hours as needed for wheezing  or shortness of breath. 08/17/16   Mesner, Barbara Cower, MD  amLODipine (NORVASC) 10 MG tablet Take 10 mg by mouth daily.    [provider]  aspirin EC 81 MG tablet Take 81 mg by mouth daily.    [provider]  atorvastatin (LIPITOR) 20 MG tablet  05/17/17   [provider]  Fluticasone-Salmeterol (ADVAIR DISKUS) 500-50 MCG/DOSE AEPB Inhale 1 puff into the lungs 2 (two) times daily. 04/12/18   Parrett, Virgel Bouquet, NP  metFORMIN (GLUCOPHAGE) 500 MG tablet Take 500 mg by mouth 2 (two) times daily with a meal.    [provider]  ranitidine (ZANTAC) 150 MG tablet Take 1 tablet (150 mg total) by mouth 2 (two) times daily. Patient not taking: No sig reported 06/25/18 10/04/20  Parrett, Virgel Bouquet, NP    Family History Family History  Problem Relation Age of Onset   CVA Other    Hypertension Other    Diabetes Mellitus II Other     Social History Social History   Tobacco Use   Smoking status: Every Day    Packs/day: 0.50    Years: 52.00    Pack years: 26.00    Types: Cigarettes    Start date: 09/26/1966   Smokeless tobacco: Current    Types: Chew   Tobacco comments:    Started smoking at age 59.  Currently smoking 2-3 cig per day  Vaping Use   Vaping Use: Never used  Substance Use Topics   Alcohol use: Yes    Alcohol/week: 0.0 standard drinks    Comment: Socially only   Drug use: No     Allergies   Azithromycin, Benazepril, Cephalexin, Benadryl [diphenhydramine hcl], Other, Nexium [esomeprazole magnesium], and Spiriva [tiotropium bromide monohydrate]   Review of Systems Review of Systems  HENT:  Positive for sore throat and trouble swallowing.   All other systems reviewed and are negative.   Physical Exam Triage Vital Signs ED Triage Vitals  Enc Vitals Group     BP 03/20/21 1404 (!) 194/100     Pulse Rate 03/20/21 1404 82     Resp 03/20/21 1404 20     Temp --      Temp src --      SpO2 03/20/21 1404 96 %     Weight --      Height --      Head  Circumference --      Peak Flow --      Pain Score 03/20/21 1405 0     Pain Loc --      Pain Edu? --      Excl. in GC? --    No data found.  Updated Vital Signs BP (!) 194/100   Pulse 82   Resp 20   SpO2 96%   Visual Acuity Right Eye Distance:   Left Eye Distance:   Bilateral Distance:    Right Eye Near:   Left Eye Near:    Bilateral Near:     Physical Exam Vitals and nursing note reviewed.  Constitutional:      General: She is not in acute distress.    Appearance: Normal appearance. She is not ill-appearing, toxic-appearing or diaphoretic.  HENT:     Head: Normocephalic and atraumatic.     Mouth/Throat:     Mouth: Angioedema present.     Tongue: No lesions. Tongue does not deviate from midline.     Pharynx: Oropharynx is clear. Uvula midline.  Eyes:     Conjunctiva/sclera: Conjunctivae normal.  Cardiovascular:     Rate and Rhythm: Normal rate.     Pulses: Normal pulses.     Heart sounds: Normal heart sounds.  Pulmonary:     Effort: Pulmonary effort is normal. No accessory muscle usage or respiratory distress.     Breath sounds: No decreased air movement or transmitted upper airway sounds. Examination of the right-upper field reveals wheezing. Examination of the left-upper field reveals wheezing. Wheezing present.  Abdominal:     General: Abdomen is flat.  Musculoskeletal:        General: Normal range of motion.     Cervical back: Normal range of motion.  Skin:    General: Skin is warm and dry.  Neurological:     General: No focal deficit present.     Mental Status: She is alert and oriented to person, place, and time.  Psychiatric:        Mood and Affect: Mood normal.     UC Treatments / Results  Labs (all labs ordered are listed, but only abnormal results are displayed) Labs Reviewed - No data to display  EKG   Radiology No results found.  Procedures Procedures (including critical care time)  Medications Ordered in UC Medications   EPINEPHrine (ADRENALIN) 0.5 mg (0.5 mg Intramuscular Given by Other 03/20/21 1410)  methylPREDNISolone sodium succinate (SOLU-MEDROL) 125 mg/2  mL injection 125 mg (125 mg Intramuscular Given by Other 03/20/21 1411)    Initial Impression / Assessment and Plan / UC Course  I have reviewed the triage vital signs and the nursing notes.  Pertinent labs & imaging results that were available during my care of the patient were reviewed by me and considered in my medical decision making (see chart for details).    Angioedema.  Airway clear at this time.  Possible anaphylaxis.  Given Epi 0.5mg  and solu-medrol IM in office.  Patient transported to ED via EMS for further evaluation.   Final Clinical Impressions(s) / UC Diagnoses   Final diagnoses:  Angioedema, initial encounter     Discharge Instructions      Patient transported to ED via EMS     ED Prescriptions   None    PDMP not reviewed this encounter.   Ivette Loyal, NP 03/20/21 1433    Ivette Loyal, NP 03/20/21 1434

## 2021-03-21 DIAGNOSIS — T783XXA Angioneurotic edema, initial encounter: Secondary | ICD-10-CM | POA: Diagnosis not present

## 2021-03-21 DIAGNOSIS — T783XXD Angioneurotic edema, subsequent encounter: Secondary | ICD-10-CM | POA: Diagnosis not present

## 2021-03-21 LAB — CBC
HCT: 40.1 % (ref 36.0–46.0)
Hemoglobin: 12.5 g/dL (ref 12.0–15.0)
MCH: 26.4 pg (ref 26.0–34.0)
MCHC: 31.2 g/dL (ref 30.0–36.0)
MCV: 84.6 fL (ref 80.0–100.0)
Platelets: 252 10*3/uL (ref 150–400)
RBC: 4.74 MIL/uL (ref 3.87–5.11)
RDW: 14.7 % (ref 11.5–15.5)
WBC: 12.1 10*3/uL — ABNORMAL HIGH (ref 4.0–10.5)
nRBC: 0 % (ref 0.0–0.2)

## 2021-03-21 LAB — BASIC METABOLIC PANEL
Anion gap: 8 (ref 5–15)
BUN: 13 mg/dL (ref 8–23)
CO2: 29 mmol/L (ref 22–32)
Calcium: 10.1 mg/dL (ref 8.9–10.3)
Chloride: 101 mmol/L (ref 98–111)
Creatinine, Ser: 0.93 mg/dL (ref 0.44–1.00)
GFR, Estimated: >60 mL/min (ref >60)
Glucose, Bld: 230 mg/dL — ABNORMAL HIGH (ref 70–99)
Potassium: 3.9 mmol/L (ref 3.5–5.1)
Sodium: 138 mmol/L (ref 135–145)

## 2021-03-21 LAB — HIV ANTIBODY (ROUTINE TESTING W REFLEX): HIV Screen 4th Generation wRfx: NONREACTIVE

## 2021-03-21 LAB — GLUCOSE, CAPILLARY: Glucose-Capillary: 267 mg/dL — ABNORMAL HIGH (ref 70–99)

## 2021-03-21 LAB — SARS CORONAVIRUS 2 (TAT 6-24 HRS): SARS Coronavirus 2: NEGATIVE

## 2021-03-21 MED ORDER — LORATADINE 10 MG PO TABS: 10.0000 mg | ORAL_TABLET | Freq: Every day | ORAL | 1 refills | Status: DC

## 2021-03-21 MED ORDER — NICOTINE 21 MG/24HR TD PT24: 21.0000 mg | MEDICATED_PATCH | TRANSDERMAL | 0 refills | Status: AC

## 2021-03-21 MED ORDER — LIDOCAINE VISCOUS HCL 2 % MT SOLN: 15.0000 mL | OROMUCOSAL | 0 refills | Status: DC | PRN

## 2021-03-21 MED ORDER — LIDOCAINE VISCOUS HCL 2 % MT SOLN
15.0000 mL | OROMUCOSAL | Status: DC | PRN
  Filled 2021-03-21: qty 15

## 2021-03-21 MED ORDER — PREDNISONE 10 MG PO TABS: 20.0000 mg | ORAL_TABLET | Freq: Every day | ORAL | 0 refills | Status: AC

## 2021-03-21 MED ORDER — EPINEPHRINE 0.3 MG/0.3ML IJ SOAJ: 0.3000 mg | INTRAMUSCULAR | 1 refills | Status: AC | PRN

## 2021-03-21 NOTE — Discharge Summary (Signed)
Physician Discharge Summary  [name] ZOX:[id number] DOB: 1950/11/12 DOA: 03/20/2021  PCP: Bernerd Limbo  Admit date: 03/20/2021 Discharge date: 03/21/2021  Admitted From: home Disposition:  home  Recommendations for Outpatient Follow-up:  Follow up with PCP in 1-2 weeks Please obtain BMP/CBC in one week Please follow up regarding tobacco cessation; pt prescribed nicotine patch at d/c Follow up C4 pending   Home Health: no  Equipment/Devices: none   Discharge Condition: stable  CODE STATUS: full  Diet recommendation: Heart Healthy / Carb Modified      Discharge Diagnoses: Principal Problem:   Angioedema Active Problems:   Hypertension   Hyperlipidemia   DM (diabetes mellitus) (HCC)   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 Artis Flock: "[name] is a 70 y.o. female with medical history significant of HTN, HLD, DM2, COPD, GERD,RA and tobacco abuse who presented to UC after she had used some chewing tobacco and felt her tongue starting to swell. She ate fish in the AM, no known allergy to fish.  Around 11:30-12:00 is when she felt her throat swelling and her tongue swelling so they went to UC. She denies any shortness of breath or stridor. She could not talk and felt tightness in her chest. She has allergy to insects and is not on any ACE-I. Denies any known insect bites.  Denies any rash or itching or hives.  She feels like to his started after the chewing tobacco, but has used with no issues in the past.  Denies any headaches, chest pain, palpitations, fever/chills, coughing, stomach pain or N/V/D.   ED Course: on arrival: 194/100, HR: 82, RR: 20 and oxygen 96% on room air. Was given  of solumedrol and epi in UC and sent to ER. She received  of iV benadryl and  of IV pepcid.  She had some improvement, but ER asked [place] to admit for monitoring of her angioedema. "  Patient had marked clinical improvement with epinephrine, steroids, pepcid and benadryl (tolerates only IV, gets N/V with oral).  She had no airway compromise.   Discharge plan: Patient was started on loratadine Continued on Pepcid. Four more days of prednisone for 5 day steroid course. Refilled her Epi-pen. Nicotine patches filled.    Discharge Instructions   Discharge Instructions     Call MD for:  extreme fatigue   Complete by: As directed    Call MD for:  persistant dizziness or light-headedness   Complete by: As directed    Call MD for:  persistant nausea and vomiting   Complete by: As directed    Call MD for:  severe uncontrolled pain   Complete by: As directed    Call MD for:  temperature >100.4   Complete by: As directed    Diet - low sodium heart healthy   Complete by: As directed    Discharge instructions   Complete by: As directed    Take Prednisone as prescribed for another 4 days.    I started you on loratadine (Claritin), and you should continue to take your famotidine (or ranitidine) as we discussed. These two medications act on different histamine receptors and hopefully will help prevent another episode of angioedema.  Please follow up and discuss with your primary care provider whether you should stay on both of these  in the long term.    If you have recurrent mouth / face / throat swelling - please call 911 and/or come to the ER right away.   Increase activity slowly   Complete by: As directed       Allergies as of 03/21/2021       Reactions   Azithromycin Anaphylaxis, Swelling   Eyes swell   Benazepril Anaphylaxis   Other reaction(s): SWELLING   Cephalexin Anaphylaxis, Swelling   Benadryl [diphenhydramine Hcl] Nausea And Vomiting   Other Other (See Comments)   Reports allergy to insects. Unsure of reaction.   Nexium [esomeprazole Magnesium] Palpitations    Spiriva [tiotropium Bromide Monohydrate] Palpitations        Medication List     STOP taking these medications    metFORMIN 500 MG tablet Commonly known as: GLUCOPHAGE       TAKE these medications    albuterol (2.5 MG/3ML) 0.083% nebulizer solution Commonly known as: PROVENTIL Take 3 mLs (2.5 mg total) by nebulization every 6 (six) hours as needed for wheezing or shortness of breath.   amLODipine 10 MG tablet Commonly known as: NORVASC Take 10 mg by mouth daily.   aspirin EC 81 MG tablet Take 81 mg by mouth daily.   atorvastatin 40 MG tablet Commonly known as: LIPITOR Take 40 mg by mouth daily.   EPINEPHrine 0.3 mg/0.3 mL Soaj injection Commonly known as: EPI-PEN Inject 0.3 mg into the muscle as needed for anaphylaxis.   famotidine 20 MG tablet Commonly known as: PEPCID Take 20 mg by mouth 2 (two) times daily.   fluticasone-salmeterol 100-50 MCG/ACT Aepb Commonly known as: ADVAIR Inhale 1 puff into the lungs 2 (two) times daily.   ibuprofen 200 MG tablet Commonly known as: ADVIL Take 200 mg by mouth every 6 (six) hours as needed for mild pain.   lidocaine 2 % solution Commonly known as: XYLOCAINE Use as directed 15 mLs in the mouth or throat every 4 (four) hours as needed for mouth pain.   loratadine 10 MG tablet Commonly known as: CLARITIN Take 1 tablet (10 mg total) by mouth daily.   multivitamin capsule Take 1 capsule by mouth daily.   nicotine 21 mg/24hr patch Commonly known as: NICODERM CQ - dosed in mg/24 hours Place 1 patch (21 mg total) onto the skin daily.   predniSONE 10 MG tablet Commonly known as: DELTASONE Take 2 tablets (20 mg total) by mouth daily for 4 days.        Allergies  Allergen Reactions   Azithromycin Anaphylaxis and Swelling    Eyes swell   Benazepril Anaphylaxis    Other reaction(s): SWELLING   Cephalexin Anaphylaxis and Swelling   Benadryl [Diphenhydramine Hcl] Nausea And Vomiting   Other Other (See Comments)     Reports allergy to insects. Unsure of reaction.   Nexium [Esomeprazole Magnesium] Palpitations   Spiriva [Tiotropium Bromide Monohydrate] Palpitations     If you experience worsening of your admission symptoms, develop shortness of breath, life threatening emergency, suicidal or homicidal thoughts you must seek medical attention immediately by calling 911 or calling your MD immediately  if symptoms less severe.    Please note   You were cared for by a hospitalist during your hospital stay. If you have any questions about your discharge medications or the care you received while you were in the hospital after you are discharged, you can call the unit and asked to speak with the hospitalist on call if the hospitalist that  took care of you is not available. Once you are discharged, your primary care physician will handle any further medical issues. Please note that NO REFILLS for any discharge medications will be authorized once you are discharged, as it is imperative that you return to your primary care physician (or establish a relationship with a primary care physician if you do not have one) for your aftercare needs so that they can reassess your need for medications and monitor your lab values.   Consultations: none    Procedures/Studies: No results found.     Subjective: Pt seen with daughter at bedside. Reports feeling better.  Swelling in mouth/throat much better.  No sensation of difficulty breathing.  Feels ready to go home.     Discharge Exam: Vitals:   03/21/21 0434 03/21/21 0737  BP: 133/80   Pulse: 61   Resp: 18   Temp: 98.1 F (36.7 C)   SpO2: 92% 95%   Vitals:   03/20/21 2000 03/20/21 2230 03/21/21 0434 03/21/21 0737  BP: 136/86 (!) 151/68 133/80   Pulse: 73 (!) 55 61   Resp: 20 20 18    Temp: 97.7 F (36.5 C)  98.1 F (36.7 C)   TempSrc: Oral  Oral   SpO2: 95% 93% 92% 95%  Weight: 87.2 kg     Height:        General: Pt is alert, awake, not in acute  distress Cardiovascular: RRR, S1/S2 +, no rubs, no gallops Respiratory: CTA bilaterally, no wheezing, no rhonchi Abdominal: Soft, NT, ND, bowel sounds + Extremities: no edema, no cyanosis    The results of significant diagnostics from this hospitalization (including imaging, microbiology, ancillary and laboratory) are listed below for reference.     Microbiology: Recent Results (from the past 240 hour(s))  SARS CORONAVIRUS 2 (TAT 6-24 HRS) Nasopharyngeal Nasopharyngeal Swab     Status: None   Collection Time: 03/20/21  5:06 PM   Specimen: Nasopharyngeal Swab  Result Value Ref Range Status   SARS Coronavirus 2 NEGATIVE NEGATIVE Final    Comment: (NOTE) SARS-CoV-2 target nucleic acids are NOT DETECTED.  The SARS-CoV-2 RNA is generally detectable in upper and lower respiratory specimens during the acute phase of infection. Negative results do not preclude SARS-CoV-2 infection, do not rule out co-infections with other pathogens, and should not be used as the sole basis for treatment or other patient management decisions. Negative results must be combined with clinical observations, patient history, and epidemiological information. The expected result is Negative.  Fact Sheet for Patients: 03/22/21  Fact Sheet for Healthcare Providers: HairSlick.no  This test is not yet approved or cleared by the [place] FDA and  has been authorized for detection and/or diagnosis of SARS-CoV-2 by FDA under an Emergency Use Authorization (EUA). This EUA will remain  in effect (meaning this test can be used) for the duration of the COVID-19 declaration under Se ction 564(b)(1) of the Act, 21 U.S.C. section 360bbb-3(b)(1), unless the authorization is terminated or revoked sooner.  Performed at [facility], [address], [place], [place]      Labs: BNP (last 3 results) No results for input(s): BNP in the  last 8760 hours. Basic Metabolic Panel: Recent Labs  Lab 03/21/21 0001  NA 138  K 3.9  CL 101  CO2 29  GLUCOSE 230*  BUN 13  CREATININE 0.93  CALCIUM 10.1   Liver Function Tests: No results for input(s): AST, ALT, ALKPHOS, BILITOT, PROT, ALBUMIN in the last 168  hours. No results for input(s): LIPASE, AMYLASE in the last 168 hours. No results for input(s): AMMONIA in the last 168 hours. CBC: Recent Labs  Lab 03/21/21 0001  WBC 12.1*  HGB 12.5  HCT 40.1  MCV 84.6  PLT 252   Cardiac Enzymes: No results for input(s): CKTOTAL, CKMB, CKMBINDEX, TROPONINI in the last 168 hours. BNP: Invalid input(s): POCBNP CBG: Recent Labs  Lab 03/20/21 2100 03/21/21 0724  GLUCAP 232* 267*   D-Dimer No results for input(s): DDIMER in the last 72 hours. Hgb A1c No results for input(s): HGBA1C in the last 72 hours. Lipid Profile No results for input(s): CHOL, HDL, LDLCALC, TRIG, CHOLHDL, LDLDIRECT in the last 72 hours. Thyroid function studies No results for input(s): TSH, T4TOTAL, T3FREE, THYROIDAB in the last 72 hours.  Invalid input(s): FREET3 Anemia work up No results for input(s): VITAMINB12, FOLATE, FERRITIN, TIBC, IRON, RETICCTPCT in the last 72 hours. Urinalysis    Component Value Date/Time   COLORURINE YELLOW 06/01/2011 0513   APPEARANCEUR CLEAR 06/01/2011 0513   LABSPEC 1.011 06/01/2011 0513   PHURINE 7.5 06/01/2011 0513   GLUCOSEU NEGATIVE 06/01/2011 0513   HGBUR TRACE (A) 06/01/2011 0513   BILIRUBINUR NEGATIVE 06/01/2011 0513   KETONESUR NEGATIVE 06/01/2011 0513   PROTEINUR NEGATIVE 06/01/2011 0513   UROBILINOGEN 1.0 06/01/2011 0513   NITRITE NEGATIVE 06/01/2011 0513   LEUKOCYTESUR NEGATIVE 06/01/2011 0513   Sepsis Labs Invalid input(s): PROCALCITONIN,  WBC,  LACTICIDVEN Microbiology Recent Results (from the past 240 hour(s))  SARS CORONAVIRUS 2 (TAT 6-24 HRS) Nasopharyngeal Nasopharyngeal Swab     Status: None   Collection Time: 03/20/21  5:06 PM    Specimen: Nasopharyngeal Swab  Result Value Ref Range Status   SARS Coronavirus 2 NEGATIVE NEGATIVE Final    Comment: (NOTE) SARS-CoV-2 target nucleic acids are NOT DETECTED.  The SARS-CoV-2 RNA is generally detectable in upper and lower respiratory specimens during the acute phase of infection. Negative results do not preclude SARS-CoV-2 infection, do not rule out co-infections with other pathogens, and should not be used as the sole basis for treatment or other patient management decisions. Negative results must be combined with clinical observations, patient history, and epidemiological information. The expected result is Negative.  Fact Sheet for Patients: HairSlick.no  Fact Sheet for Healthcare Providers: quierodirigir.com  This test is not yet approved or cleared by the [place] FDA and  has been authorized for detection and/or diagnosis of SARS-CoV-2 by FDA under an Emergency Use Authorization (EUA). This EUA will remain  in effect (meaning this test can be used) for the duration of the COVID-19 declaration under Se ction 564(b)(1) of the Act, 21 U.S.C. section 360bbb-3(b)(1), unless the authorization is terminated or revoked sooner.  Performed at [facility], [address] [place], [place] [address]      Time coordinating discharge: Over 30 minutes  SIGNED:   Pennie Banter, DO [facility] 03/21/2021, 9:54 AM   If 7PM-7AM, please contact night-coverage [email or web address]

## 2021-03-21 NOTE — Plan of Care (Signed)

## 2021-03-22 LAB — HEMOGLOBIN A1C
Hgb A1c MFr Bld: 7.1 % — ABNORMAL HIGH (ref 4.8–5.6)
Mean Plasma Glucose: 157 mg/dL

## 2021-03-23 LAB — C4 COMPLEMENT: Complement C4, Body Fluid: 42 mg/dL — ABNORMAL HIGH (ref 12–38)

## 2021-08-22 ENCOUNTER — Encounter (HOSPITAL_COMMUNITY): Payer: Self-pay

## 2021-08-22 ENCOUNTER — Other Ambulatory Visit: Payer: Self-pay

## 2021-08-22 ENCOUNTER — Emergency Department (HOSPITAL_COMMUNITY): Payer: Medicare Other

## 2021-08-22 ENCOUNTER — Inpatient Hospital Stay (HOSPITAL_COMMUNITY)
Admission: EM | Admit: 2021-08-22 | Discharge: 2021-08-27 | DRG: 190 | Disposition: A | Discharge status: Home / Self Care | Payer: Medicare Other | Attending: Family Medicine | Admitting: Family Medicine

## 2021-08-22 DIAGNOSIS — M069 Rheumatoid arthritis, unspecified: Secondary | ICD-10-CM | POA: Diagnosis present

## 2021-08-22 DIAGNOSIS — I10 Essential (primary) hypertension: Secondary | ICD-10-CM | POA: Diagnosis present

## 2021-08-22 DIAGNOSIS — Z9889 Other specified postprocedural states: Secondary | ICD-10-CM

## 2021-08-22 DIAGNOSIS — Z9289 Personal history of other medical treatment: Secondary | ICD-10-CM

## 2021-08-22 DIAGNOSIS — I480 Paroxysmal atrial fibrillation: Secondary | ICD-10-CM

## 2021-08-22 DIAGNOSIS — Z881 Allergy status to other antibiotic agents status: Secondary | ICD-10-CM

## 2021-08-22 DIAGNOSIS — J441 Chronic obstructive pulmonary disease with (acute) exacerbation: Principal | ICD-10-CM | POA: Diagnosis present

## 2021-08-22 DIAGNOSIS — IMO0001 Reserved for inherently not codable concepts without codable children: Secondary | ICD-10-CM

## 2021-08-22 DIAGNOSIS — E0781 Sick-euthyroid syndrome: Secondary | ICD-10-CM | POA: Diagnosis present

## 2021-08-22 DIAGNOSIS — R0902 Hypoxemia: Secondary | ICD-10-CM

## 2021-08-22 DIAGNOSIS — Z7982 Long term (current) use of aspirin: Secondary | ICD-10-CM

## 2021-08-22 DIAGNOSIS — Z888 Allergy status to other drugs, medicaments and biological substances status: Secondary | ICD-10-CM

## 2021-08-22 DIAGNOSIS — E785 Hyperlipidemia, unspecified: Secondary | ICD-10-CM | POA: Diagnosis present

## 2021-08-22 DIAGNOSIS — Z7951 Long term (current) use of inhaled steroids: Secondary | ICD-10-CM

## 2021-08-22 DIAGNOSIS — K219 Gastro-esophageal reflux disease without esophagitis: Secondary | ICD-10-CM | POA: Diagnosis present

## 2021-08-22 DIAGNOSIS — Z794 Long term (current) use of insulin: Secondary | ICD-10-CM

## 2021-08-22 DIAGNOSIS — E119 Type 2 diabetes mellitus without complications: Secondary | ICD-10-CM | POA: Diagnosis present

## 2021-08-22 DIAGNOSIS — Z7984 Long term (current) use of oral hypoglycemic drugs: Secondary | ICD-10-CM

## 2021-08-22 DIAGNOSIS — Z7901 Long term (current) use of anticoagulants: Secondary | ICD-10-CM

## 2021-08-22 DIAGNOSIS — F17210 Nicotine dependence, cigarettes, uncomplicated: Secondary | ICD-10-CM | POA: Diagnosis present

## 2021-08-22 DIAGNOSIS — E669 Obesity, unspecified: Secondary | ICD-10-CM | POA: Diagnosis present

## 2021-08-22 DIAGNOSIS — R9431 Abnormal electrocardiogram [ECG] [EKG]: Secondary | ICD-10-CM

## 2021-08-22 DIAGNOSIS — J9601 Acute respiratory failure with hypoxia: Secondary | ICD-10-CM

## 2021-08-22 DIAGNOSIS — Z79899 Other long term (current) drug therapy: Secondary | ICD-10-CM

## 2021-08-22 DIAGNOSIS — I4891 Unspecified atrial fibrillation: Secondary | ICD-10-CM

## 2021-08-22 DIAGNOSIS — Z823 Family history of stroke: Secondary | ICD-10-CM

## 2021-08-22 DIAGNOSIS — Z20822 Contact with and (suspected) exposure to covid-19: Secondary | ICD-10-CM | POA: Diagnosis present

## 2021-08-22 DIAGNOSIS — E079 Disorder of thyroid, unspecified: Secondary | ICD-10-CM

## 2021-08-22 DIAGNOSIS — Z6833 Body mass index (BMI) 33.0-33.9, adult: Secondary | ICD-10-CM

## 2021-08-22 DIAGNOSIS — Z8249 Family history of ischemic heart disease and other diseases of the circulatory system: Secondary | ICD-10-CM

## 2021-08-22 DIAGNOSIS — Z716 Tobacco abuse counseling: Secondary | ICD-10-CM

## 2021-08-22 DIAGNOSIS — E7800 Pure hypercholesterolemia, unspecified: Secondary | ICD-10-CM | POA: Diagnosis present

## 2021-08-22 DIAGNOSIS — F17200 Nicotine dependence, unspecified, uncomplicated: Secondary | ICD-10-CM

## 2021-08-22 DIAGNOSIS — M1990 Unspecified osteoarthritis, unspecified site: Secondary | ICD-10-CM | POA: Diagnosis present

## 2021-08-22 LAB — CBC WITH DIFFERENTIAL/PLATELET
Abs Immature Granulocytes: 0.04 10*3/uL (ref 0.00–0.07)
Basophils Absolute: 0 10*3/uL (ref 0.0–0.1)
Basophils Relative: 0 %
Eosinophils Absolute: 0.1 10*3/uL (ref 0.0–0.5)
Eosinophils Relative: 1 %
HCT: 40.1 % (ref 36.0–46.0)
Hemoglobin: 12.5 g/dL (ref 12.0–15.0)
Immature Granulocytes: 0 %
Lymphocytes Relative: 6 %
Lymphs Abs: 0.5 10*3/uL — ABNORMAL LOW (ref 0.7–4.0)
MCH: 26.3 pg (ref 26.0–34.0)
MCHC: 31.2 g/dL (ref 30.0–36.0)
MCV: 84.4 fL (ref 80.0–100.0)
Monocytes Absolute: 0.2 10*3/uL (ref 0.1–1.0)
Monocytes Relative: 2 %
Neutro Abs: 8.7 10*3/uL — ABNORMAL HIGH (ref 1.7–7.7)
Neutrophils Relative %: 91 %
Platelets: 238 10*3/uL (ref 150–400)
RBC: 4.75 MIL/uL (ref 3.87–5.11)
RDW: 15.4 % (ref 11.5–15.5)
WBC: 9.6 10*3/uL (ref 4.0–10.5)
nRBC: 0 % (ref 0.0–0.2)

## 2021-08-22 LAB — COMPREHENSIVE METABOLIC PANEL
ALT: 16 U/L (ref 0–44)
AST: 16 U/L (ref 15–41)
Albumin: 3.7 g/dL (ref 3.5–5.0)
Alkaline Phosphatase: 89 U/L (ref 38–126)
Anion gap: 8 (ref 5–15)
BUN: 8 mg/dL (ref 8–23)
CO2: 27 mmol/L (ref 22–32)
Calcium: 9.5 mg/dL (ref 8.9–10.3)
Chloride: 100 mmol/L (ref 98–111)
Creatinine, Ser: 0.84 mg/dL (ref 0.44–1.00)
GFR, Estimated: >60 mL/min (ref >60)
Glucose, Bld: 151 mg/dL — ABNORMAL HIGH (ref 70–99)
Potassium: 3.5 mmol/L (ref 3.5–5.1)
Sodium: 135 mmol/L (ref 135–145)
Total Bilirubin: 0.6 mg/dL (ref 0.3–1.2)
Total Protein: 6.8 g/dL (ref 6.5–8.1)

## 2021-08-22 LAB — TROPONIN I (HIGH SENSITIVITY)
Troponin I (High Sensitivity): 5 ng/L (ref <18)
Troponin I (High Sensitivity): 5 ng/L (ref <18)

## 2021-08-22 LAB — BRAIN NATRIURETIC PEPTIDE: B Natriuretic Peptide: 112.3 pg/mL — ABNORMAL HIGH (ref 0.0–100.0)

## 2021-08-22 LAB — RESP PANEL BY RT-PCR (FLU A&B, COVID) ARPGX2
Influenza A by PCR: NEGATIVE
Influenza B by PCR: NEGATIVE
SARS Coronavirus 2 by RT PCR: NEGATIVE

## 2021-08-22 MED ORDER — ALBUTEROL SULFATE (2.5 MG/3ML) 0.083% IN NEBU
2.5000 mg | INHALATION_SOLUTION | Freq: Once | RESPIRATORY_TRACT | Status: AC
  Administered 2021-08-22: 22:00:00 2.5 mg via RESPIRATORY_TRACT
  Filled 2021-08-22: qty 3

## 2021-08-22 MED ORDER — METHYLPREDNISOLONE SODIUM SUCC 125 MG IJ SOLR
125.0000 mg | Freq: Once | INTRAMUSCULAR | Status: AC
  Administered 2021-08-22: 22:00:00 125 mg via INTRAVENOUS
  Filled 2021-08-22: qty 2

## 2021-08-22 MED ORDER — ALBUTEROL SULFATE (2.5 MG/3ML) 0.083% IN NEBU
2.5000 mg | INHALATION_SOLUTION | Freq: Once | RESPIRATORY_TRACT | Status: AC
  Administered 2021-08-22: 23:00:00 2.5 mg via RESPIRATORY_TRACT
  Filled 2021-08-22: qty 3

## 2021-08-22 MED ORDER — ACETAMINOPHEN 325 MG PO TABS
650.0000 mg | ORAL_TABLET | Freq: Once | ORAL | Status: AC
  Administered 2021-08-22: 22:00:00 650 mg via ORAL
  Filled 2021-08-22: qty 2

## 2021-08-22 NOTE — ED Provider Notes (Signed)
[facility] EMERGENCY DEPARTMENT Provider Note   CSN: [id number] Arrival date & time: 08/22/21  1607     History Chief Complaint  Patient presents with   Fatigue    [name] is a 70 y.o. female.  Patient presented chief complaint of generalized body aches fevers cough congestion shortness of breath.  Symptoms ongoing for 2 days.  Denies chest pain or abdominal pain.  Also complains of a moderate headache.      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     Family History  Problem Relation Age of Onset   CVA Other    Hypertension Other    Diabetes Mellitus II Other     Social History   Tobacco Use   Smoking status: Every Day    Packs/day: 0.50    Years: 52.00    Pack years: 26.00    Types: Cigarettes    Start date: 09/26/1966   Smokeless tobacco: Current    Types: Chew   Tobacco comments:    Started smoking at age 81.  Currently smoking 2-3 cig per day  Vaping Use   Vaping Use: Never used  Substance Use Topics   Alcohol use: Yes    Alcohol/week: 0.0 standard drinks    Comment: Socially only   Drug use: No    Home Medications Prior to Admission medications    Medication Sig Start Date End Date Taking? Authorizing Provider  albuterol (PROVENTIL) (2.5 MG/3ML) 0.083% nebulizer solution Take 3 mLs (2.5 mg total) by nebulization every 6 (six) hours as needed for wheezing or shortness of breath. 08/17/16   Mesner, Corene Cornea, MD  amLODipine (NORVASC) 10 MG tablet Take 10 mg by mouth daily.    [provider]  aspirin EC 81 MG tablet Take 81 mg by mouth daily.    [provider]  atorvastatin (LIPITOR) 40 MG tablet Take 40 mg by mouth daily. 05/17/17   [provider]  EPINEPHrine 0.3 mg/0.3 mL IJ SOAJ injection Inject 0.3 mg into the muscle as needed for anaphylaxis. 03/21/21   Ezekiel Slocumb, DO  famotidine (PEPCID) 20 MG tablet Take 20 mg by mouth 2 (two) times daily. 02/25/21   [provider]  fluticasone-salmeterol (ADVAIR) 100-50 MCG/ACT AEPB Inhale 1 puff into the lungs 2 (two) times daily. 03/12/21   [provider]  ibuprofen (ADVIL) 200 MG tablet Take 200 mg by mouth every 6 (six) hours as needed for mild pain.    [provider]  lidocaine (XYLOCAINE) 2 % solution Use as directed 15 mLs in the mouth or  throat every 4 (four) hours as needed for mouth pain. 03/21/21   Pennie Banter, DO  loratadine (CLARITIN) 10 MG tablet Take 1 tablet (10 mg total) by mouth daily. 03/21/21   Pennie Banter, DO  Multiple Vitamin (MULTIVITAMIN) capsule Take 1 capsule by mouth daily.    [provider]  ranitidine (ZANTAC) 150 MG tablet Take 1 tablet (150 mg total) by mouth 2 (two) times daily. Patient not taking: No sig reported 06/25/18 10/04/20  Parrett, Virgel Bouquet, NP    Allergies    Azithromycin, Benazepril, Cephalexin, Benadryl [diphenhydramine hcl], Other, Nexium [esomeprazole magnesium], and Spiriva [tiotropium bromide monohydrate]  Review of Systems   Review of Systems  Constitutional:  Positive for fever.  HENT:  Negative for ear pain.   Eyes:  Negative for pain.  Respiratory:  Positive for cough  and shortness of breath.   Cardiovascular:  Negative for chest pain.  Gastrointestinal:  Negative for abdominal pain.  Genitourinary:  Negative for flank pain.  Musculoskeletal:  Negative for back pain.  Skin:  Negative for rash.  Neurological:  Negative for headaches.   Physical Exam Updated Vital Signs BP 127/70   Pulse 75   Temp 99 F (37.2 C) (Oral)   Resp 19   Ht 5\' 4"  (1.626 m)   Wt 88.5 kg   SpO2 91%   BMI 33.47 kg/m   Physical Exam Constitutional:      General: She is not in acute distress.    Appearance: Normal appearance.  HENT:     Head: Normocephalic.     Nose: Nose normal.  Eyes:     Extraocular Movements: Extraocular movements intact.  Cardiovascular:     Rate and Rhythm: Normal rate.  Pulmonary:     Effort: Respiratory distress present.     Breath sounds: Wheezing present.  Musculoskeletal:        General: Normal range of motion.     Cervical back: Normal range of motion.  Neurological:     General: No focal deficit present.     Mental Status: She is alert. Mental status is at baseline.    ED Results / Procedures / Treatments   Labs (all labs ordered are listed, but only abnormal results are displayed) Labs Reviewed  CBC WITH DIFFERENTIAL/PLATELET - Abnormal; Notable for the following components:      Result Value   Neutro Abs 8.7 (*)    Lymphs Abs 0.5 (*)    All other components within normal limits  COMPREHENSIVE METABOLIC PANEL - Abnormal; Notable for the following components:   Glucose, Bld 151 (*)    All other components within normal limits  BRAIN NATRIURETIC PEPTIDE - Abnormal; Notable for the following components:   B Natriuretic Peptide 112.3 (*)    All other components within normal limits  RESP PANEL BY RT-PCR (FLU A&B, COVID) ARPGX2  CULTURE, BLOOD (ROUTINE X 2)  CULTURE, BLOOD (ROUTINE X 2)  TROPONIN I (HIGH SENSITIVITY)  TROPONIN I (HIGH SENSITIVITY)    EKG EKG Interpretation  Date/Time:  Sunday August 22 2021 16:08:17  EST Ventricular Rate:  106 PR Interval:  141 QRS Duration: 73 QT Interval:  296 QTC Calculation: 393 R Axis:   74 Text Interpretation: Sinus tachycardia Probable left atrial enlargement Nonspecific T abnrm, anterolateral leads Confirmed by 01-03-1980 ([id number]) on 08/22/2021 6:57:23 PM  Radiology DG Chest Port 1 View  Result Date: 08/22/2021 CLINICAL DATA:  Cough, fever and congestion since last night. EXAM: PORTABLE CHEST 1 VIEW COMPARISON:  Chest radiograph 10/04/2020 FINDINGS: The heart size and mediastinal contours are within normal limits. Both lungs are clear. No acute osseous abnormality. IMPRESSION: No acute cardiopulmonary abnormality. Electronically Signed   By: Ileana Roup M.D.   On: 08/22/2021 16:54    Procedures Procedures   Medications Ordered in ED Medications  albuterol (PROVENTIL) (2.5 MG/3ML) 0.083% nebulizer solution 2.5 mg (has no administration in time range)  methylPREDNISolone sodium succinate (SOLU-MEDROL) 125 mg/2 mL injection 125 mg (125 mg Intravenous Given 08/22/21 2203)  albuterol (PROVENTIL) (2.5 MG/3ML) 0.083% nebulizer solution 2.5 mg (2.5 mg Nebulization Given 08/22/21 2206)  albuterol (PROVENTIL) (2.5 MG/3ML) 0.083% nebulizer solution 2.5 mg (2.5 mg Nebulization Given 08/22/21 2203)  acetaminophen (TYLENOL) tablet 650 mg (650 mg Oral Given 08/22/21 2202)    ED Course  I have reviewed the triage vital signs and the nursing notes.  Pertinent labs & imaging results that were available during my care of the patient were reviewed by me and considered in my medical decision making (see chart for details).    MDM Rules/Calculators/A&P                           Patient presents with diffuse wheezes.  Hypoxemic on room air to about 88 to 89% which is low for her.  Viral panel is negative.  Chest x-ray is unremarkable.  Patient given steroids and multiple breathing treatments, however O2 saturation still remains around 89%.  Consult with medicine for  admission for COPD exacerbation.  Final Clinical Impression(s) / ED Diagnoses Final diagnoses:  COPD exacerbation ([place])  Hypoxemia    Rx / DC Orders ED Discharge Orders     None        Luna Fuse, MD 08/22/21 2246

## 2021-08-22 NOTE — ED Triage Notes (Signed)
Pt BIB EMS for malaise for past 2 days, fever, body aches, cough w/white clear sputum, has hx of COPD

## 2021-08-22 NOTE — ED Notes (Signed)
Provided pt crackers and beverage

## 2021-08-23 ENCOUNTER — Inpatient Hospital Stay (HOSPITAL_COMMUNITY): Payer: Medicare Other

## 2021-08-23 DIAGNOSIS — I4891 Unspecified atrial fibrillation: Secondary | ICD-10-CM | POA: Diagnosis not present

## 2021-08-23 DIAGNOSIS — Z7984 Long term (current) use of oral hypoglycemic drugs: Secondary | ICD-10-CM | POA: Diagnosis not present

## 2021-08-23 DIAGNOSIS — J441 Chronic obstructive pulmonary disease with (acute) exacerbation: Principal | ICD-10-CM

## 2021-08-23 DIAGNOSIS — E119 Type 2 diabetes mellitus without complications: Secondary | ICD-10-CM | POA: Diagnosis present

## 2021-08-23 DIAGNOSIS — E669 Obesity, unspecified: Secondary | ICD-10-CM | POA: Diagnosis present

## 2021-08-23 DIAGNOSIS — E079 Disorder of thyroid, unspecified: Secondary | ICD-10-CM | POA: Diagnosis not present

## 2021-08-23 DIAGNOSIS — J9601 Acute respiratory failure with hypoxia: Secondary | ICD-10-CM | POA: Diagnosis present

## 2021-08-23 DIAGNOSIS — Z823 Family history of stroke: Secondary | ICD-10-CM | POA: Diagnosis not present

## 2021-08-23 DIAGNOSIS — F17210 Nicotine dependence, cigarettes, uncomplicated: Secondary | ICD-10-CM | POA: Diagnosis present

## 2021-08-23 DIAGNOSIS — R9431 Abnormal electrocardiogram [ECG] [EKG]: Secondary | ICD-10-CM | POA: Diagnosis not present

## 2021-08-23 DIAGNOSIS — Z888 Allergy status to other drugs, medicaments and biological substances status: Secondary | ICD-10-CM | POA: Diagnosis not present

## 2021-08-23 DIAGNOSIS — Z8249 Family history of ischemic heart disease and other diseases of the circulatory system: Secondary | ICD-10-CM | POA: Diagnosis not present

## 2021-08-23 DIAGNOSIS — M1990 Unspecified osteoarthritis, unspecified site: Secondary | ICD-10-CM | POA: Diagnosis present

## 2021-08-23 DIAGNOSIS — Z79899 Other long term (current) drug therapy: Secondary | ICD-10-CM | POA: Diagnosis not present

## 2021-08-23 DIAGNOSIS — E0781 Sick-euthyroid syndrome: Secondary | ICD-10-CM | POA: Diagnosis present

## 2021-08-23 DIAGNOSIS — Z20822 Contact with and (suspected) exposure to covid-19: Secondary | ICD-10-CM | POA: Diagnosis present

## 2021-08-23 DIAGNOSIS — Z7951 Long term (current) use of inhaled steroids: Secondary | ICD-10-CM | POA: Diagnosis not present

## 2021-08-23 DIAGNOSIS — Z7982 Long term (current) use of aspirin: Secondary | ICD-10-CM | POA: Diagnosis not present

## 2021-08-23 DIAGNOSIS — M069 Rheumatoid arthritis, unspecified: Secondary | ICD-10-CM | POA: Diagnosis present

## 2021-08-23 DIAGNOSIS — Z794 Long term (current) use of insulin: Secondary | ICD-10-CM | POA: Diagnosis not present

## 2021-08-23 DIAGNOSIS — I480 Paroxysmal atrial fibrillation: Secondary | ICD-10-CM | POA: Diagnosis not present

## 2021-08-23 DIAGNOSIS — K219 Gastro-esophageal reflux disease without esophagitis: Secondary | ICD-10-CM | POA: Diagnosis present

## 2021-08-23 DIAGNOSIS — Z7901 Long term (current) use of anticoagulants: Secondary | ICD-10-CM | POA: Diagnosis not present

## 2021-08-23 DIAGNOSIS — E7800 Pure hypercholesterolemia, unspecified: Secondary | ICD-10-CM | POA: Diagnosis present

## 2021-08-23 DIAGNOSIS — Z9889 Other specified postprocedural states: Secondary | ICD-10-CM | POA: Diagnosis not present

## 2021-08-23 DIAGNOSIS — I10 Essential (primary) hypertension: Secondary | ICD-10-CM | POA: Diagnosis present

## 2021-08-23 DIAGNOSIS — R0902 Hypoxemia: Secondary | ICD-10-CM | POA: Diagnosis present

## 2021-08-23 DIAGNOSIS — Z6833 Body mass index (BMI) 33.0-33.9, adult: Secondary | ICD-10-CM | POA: Diagnosis not present

## 2021-08-23 DIAGNOSIS — Z881 Allergy status to other antibiotic agents status: Secondary | ICD-10-CM | POA: Diagnosis not present

## 2021-08-23 LAB — ECHOCARDIOGRAM COMPLETE
AR max vel: 3.39 cm2
AV Area VTI: 3.41 cm2
AV Area mean vel: 3.7 cm2
AV Mean grad: 3 mmHg
AV Peak grad: 7.2 mmHg
Ao pk vel: 1.34 m/s
Area-P 1/2: 3.84 cm2
Height: 64 in
S' Lateral: 3 cm
Weight: 3120 oz

## 2021-08-23 LAB — HEMOGLOBIN A1C
Hgb A1c MFr Bld: 7.3 % — ABNORMAL HIGH (ref 4.8–5.6)
Mean Plasma Glucose: 163 mg/dL

## 2021-08-23 LAB — HIV ANTIBODY (ROUTINE TESTING W REFLEX): HIV Screen 4th Generation wRfx: NONREACTIVE

## 2021-08-23 LAB — PROCALCITONIN: Procalcitonin: <0.1 ng/mL

## 2021-08-23 LAB — TSH: TSH: 0.079 u[IU]/mL — ABNORMAL LOW (ref 0.350–4.500)

## 2021-08-23 MED ORDER — ENOXAPARIN SODIUM 40 MG/0.4ML IJ SOSY: 40.0000 mg | PREFILLED_SYRINGE | INTRAMUSCULAR | Status: DC

## 2021-08-23 MED ORDER — ALBUTEROL SULFATE (2.5 MG/3ML) 0.083% IN NEBU
2.5000 mg | INHALATION_SOLUTION | RESPIRATORY_TRACT | Status: DC | PRN
  Administered 2021-08-25: 2.5 mg via RESPIRATORY_TRACT
  Filled 2021-08-23: qty 3

## 2021-08-23 MED ORDER — AMLODIPINE BESYLATE 5 MG PO TABS
10.0000 mg | ORAL_TABLET | Freq: Every day | ORAL | Status: DC
  Administered 2021-08-23: 09:00:00 10 mg via ORAL
  Filled 2021-08-23: qty 2

## 2021-08-23 MED ORDER — NICOTINE 7 MG/24HR TD PT24
7.0000 mg | MEDICATED_PATCH | Freq: Every day | TRANSDERMAL | Status: DC
  Administered 2021-08-23 – 2021-08-27 (×5): 7 mg via TRANSDERMAL
  Filled 2021-08-23 (×7): qty 1

## 2021-08-23 MED ORDER — METHYLPREDNISOLONE SODIUM SUCC 125 MG IJ SOLR
60.0000 mg | Freq: Two times a day (BID) | INTRAMUSCULAR | Status: DC
Start: 2021-08-23 — End: 2021-08-24
  Administered 2021-08-23 – 2021-08-24 (×2): 60 mg via INTRAVENOUS
  Filled 2021-08-23 (×3): qty 2

## 2021-08-23 MED ORDER — ATORVASTATIN CALCIUM 40 MG PO TABS
40.0000 mg | ORAL_TABLET | Freq: Every day | ORAL | Status: DC
  Administered 2021-08-23 – 2021-08-27 (×5): 40 mg via ORAL
  Filled 2021-08-23 (×5): qty 1

## 2021-08-23 MED ORDER — ASPIRIN EC 81 MG PO TBEC
81.0000 mg | DELAYED_RELEASE_TABLET | Freq: Every day | ORAL | Status: DC
  Administered 2021-08-23: 09:00:00 81 mg via ORAL
  Filled 2021-08-23: qty 1

## 2021-08-23 MED ORDER — FAMOTIDINE 20 MG PO TABS
20.0000 mg | ORAL_TABLET | Freq: Two times a day (BID) | ORAL | Status: DC
  Administered 2021-08-23 – 2021-08-27 (×9): 20 mg via ORAL
  Filled 2021-08-23 (×9): qty 1

## 2021-08-23 MED ORDER — DILTIAZEM HCL 60 MG PO TABS
30.0000 mg | ORAL_TABLET | Freq: Four times a day (QID) | ORAL | Status: DC
  Administered 2021-08-23: 17:00:00 30 mg via ORAL
  Filled 2021-08-23 (×4): qty 1

## 2021-08-23 MED ORDER — DOXYCYCLINE HYCLATE 100 MG PO TABS: 100.0000 mg | ORAL_TABLET | Freq: Two times a day (BID) | ORAL | Status: DC

## 2021-08-23 MED ORDER — DM-GUAIFENESIN ER 30-600 MG PO TB12
1.0000 | ORAL_TABLET | Freq: Two times a day (BID) | ORAL | Status: DC
  Administered 2021-08-23 – 2021-08-26 (×8): 1 via ORAL
  Filled 2021-08-23 (×10): qty 1

## 2021-08-23 MED ORDER — METHYLPREDNISOLONE SODIUM SUCC 125 MG IJ SOLR: 120.0000 mg | INTRAMUSCULAR | Status: DC

## 2021-08-23 MED ORDER — BUDESONIDE 0.5 MG/2ML IN SUSP
2.0000 mg | Freq: Two times a day (BID) | RESPIRATORY_TRACT | Status: DC
  Administered 2021-08-23 (×3): 2 mg via RESPIRATORY_TRACT
  Administered 2021-08-24: 0.5 mg via RESPIRATORY_TRACT
  Administered 2021-08-24: 2 mg via RESPIRATORY_TRACT
  Administered 2021-08-25: 0.5 mg via RESPIRATORY_TRACT
  Filled 2021-08-23 (×6): qty 8

## 2021-08-23 MED ORDER — IPRATROPIUM-ALBUTEROL 0.5-2.5 (3) MG/3ML IN SOLN
3.0000 mL | Freq: Four times a day (QID) | RESPIRATORY_TRACT | Status: DC
  Administered 2021-08-23 (×4): 3 mL via RESPIRATORY_TRACT
  Filled 2021-08-23 (×6): qty 3

## 2021-08-23 MED ORDER — METHYLPREDNISOLONE SODIUM SUCC 125 MG IJ SOLR
90.0000 mg | Freq: Two times a day (BID) | INTRAMUSCULAR | Status: DC
Start: 2021-08-23 — End: 2021-08-23
  Administered 2021-08-23: 09:00:00 90 mg via INTRAVENOUS
  Filled 2021-08-23: qty 2

## 2021-08-23 MED ORDER — DILTIAZEM LOAD VIA INFUSION
10.0000 mg | Freq: Once | INTRAVENOUS | Status: AC
  Administered 2021-08-23: 22:00:00 10 mg via INTRAVENOUS
  Filled 2021-08-23: qty 10

## 2021-08-23 MED ORDER — APIXABAN 5 MG PO TABS
5.0000 mg | ORAL_TABLET | Freq: Two times a day (BID) | ORAL | Status: DC
  Administered 2021-08-23 – 2021-08-27 (×8): 5 mg via ORAL
  Filled 2021-08-23 (×9): qty 1

## 2021-08-23 MED ORDER — DILTIAZEM HCL-DEXTROSE 125-5 MG/125ML-% IV SOLN (PREMIX)
5.0000 mg/h | INTRAVENOUS | Status: DC
  Administered 2021-08-23 – 2021-08-24 (×2): 5 mg/h via INTRAVENOUS
  Administered 2021-08-24: 15 mg/h via INTRAVENOUS
  Administered 2021-08-24: 5 mg/h via INTRAVENOUS
  Filled 2021-08-23 (×3): qty 125

## 2021-08-23 MED ORDER — ACETAMINOPHEN 325 MG PO TABS
650.0000 mg | ORAL_TABLET | Freq: Four times a day (QID) | ORAL | Status: DC | PRN
  Administered 2021-08-26 – 2021-08-27 (×2): 650 mg via ORAL
  Filled 2021-08-23 (×2): qty 2

## 2021-08-23 NOTE — ED Notes (Signed)
Ambulated pt with pulse ox, pt had a steady gait and a low of 89%

## 2021-08-23 NOTE — H&P (Signed)
History and Physical    [name] [id number] DOB: 1951-08-23 DOA: 08/22/2021  PCP: Marzetta Board, NP Patient coming from: Home  Chief Complaint: Shortness of  HPI: [name] is a 70 y.o. female with medical history significant of COPD, tobacco use, diet-controlled type II diabetes, hypertension, hyperlipidemia, rheumatoid arthritis, GERD presented with complaints of cough and URI symptoms.  Wheezing and oxygen saturation 88-89% on room air.  Placed on 2 L supplemental oxygen and was given IV Solu-Medrol 125 mg and bronchodilator treatments.  Labs showing no leukocytosis.  No significant elevation of BNP.  EKG showing new T wave abnormality in anterolateral leads.  High-sensitivity troponin negative x2.  COVID and influenza PCR negative.  Chest x-ray negative for acute finding.  Patient reports 1 month history of productive cough.  She is using her home inhalers including Advair and albuterol.  Also using albuterol nebulizer.  States she was recently treated with a 7-day course of prednisone and doxycycline which she finished last week but cough has persisted.  Since yesterday she is having shortness of breath and wheezing.  Denies chest pain.  Her temperature was 99 F at home.  Also endorsing rhinorrhea.  She smokes cigarettes, a pack lasts about 4 days.  She is fully vaccinated against COVID.  She does not use supplemental oxygen at home.  Review of Systems:  All systems reviewed and apart from history of presenting illness, are negative.  Past Medical History:  Diagnosis Date   Arthritis    Asthma    COPD (chronic obstructive pulmonary disease) (HCC)    Diabetes mellitus    Heart murmur    High cholesterol    Hyperlipidemia    Hypertension    Kidney infection    Kidney stone    Reflux     Past Surgical History:  Procedure Laterality Date   fillopian tube removal     right breast cyst removal       reports that she has been smoking cigarettes. She started  smoking about 54 years ago. She has a 26.00 pack-year smoking history. Her smokeless tobacco use includes chew. She reports current alcohol use. She reports that she does not use drugs.  Allergies  Allergen Reactions   Azithromycin Anaphylaxis and Swelling    Eyes swell   Benazepril Anaphylaxis    Other reaction(s): SWELLING   Cephalexin Anaphylaxis and Swelling   Benadryl [Diphenhydramine Hcl] Nausea And Vomiting   Other Other (See Comments)    Reports allergy to insects. Unsure of reaction.   Nexium [Esomeprazole Magnesium] Palpitations   Spiriva [Tiotropium Bromide Monohydrate] Palpitations    Family History  Problem Relation Age of Onset   CVA Other    Hypertension Other    Diabetes Mellitus II Other     Prior to Admission medications   Medication Sig Start Date End Date Taking? Authorizing Provider  albuterol (PROVENTIL) (2.5 MG/3ML) 0.083% nebulizer solution Take 3 mLs (2.5 mg total) by nebulization every 6 (six) hours as needed for wheezing or shortness of breath. 08/17/16   Mesner, Corene Cornea, MD  amLODipine (NORVASC) 10 MG tablet Take 10 mg by mouth daily.    [provider]  aspirin EC 81 MG tablet Take 81 mg by mouth daily.    [provider]  atorvastatin (LIPITOR) 40 MG tablet Take 40 mg by mouth daily. 05/17/17   [provider]  EPINEPHrine 0.3 mg/0.3 mL IJ SOAJ injection Inject 0.3 mg into the muscle as needed for anaphylaxis. 03/21/21  Esaw Grandchild A, DO  famotidine (PEPCID) 20 MG tablet Take 20 mg by mouth 2 (two) times daily. 02/25/21   [provider]  fluticasone-salmeterol (ADVAIR) 100-50 MCG/ACT AEPB Inhale 1 puff into the lungs 2 (two) times daily. 03/12/21   [provider]  ibuprofen (ADVIL) 200 MG tablet Take 200 mg by mouth every 6 (six) hours as needed for mild pain.    [provider]  lidocaine (XYLOCAINE) 2 % solution Use as directed 15 mLs in the mouth or throat every 4 (four) hours as needed for mouth  pain. 03/21/21   Pennie Banter, DO  loratadine (CLARITIN) 10 MG tablet Take 1 tablet (10 mg total) by mouth daily. 03/21/21   Pennie Banter, DO  Multiple Vitamin (MULTIVITAMIN) capsule Take 1 capsule by mouth daily.    [provider]  ranitidine (ZANTAC) 150 MG tablet Take 1 tablet (150 mg total) by mouth 2 (two) times daily. Patient not taking: No sig reported 06/25/18 10/04/20  Parrett, Virgel Bouquet, NP    Physical Exam: Vitals:   08/22/21 1945 08/22/21 2000 08/22/21 2045 08/22/21 2230  BP: 131/77 129/74 123/75 127/70  Pulse: 81 73 80 75  Resp: 13 17 15 19   Temp:      TempSrc:      SpO2: 91% 90% 93% 91%  Weight:      Height:        Physical Exam Constitutional:      General: She is not in acute distress. HENT:     Head: Normocephalic and atraumatic.  Eyes:     Extraocular Movements: Extraocular movements intact.     Conjunctiva/sclera: Conjunctivae normal.  Cardiovascular:     Rate and Rhythm: Normal rate and regular rhythm.     Pulses: Normal pulses.  Pulmonary:     Effort: Pulmonary effort is normal. No respiratory distress.     Breath sounds: Wheezing present. No rales.  Abdominal:     General: Bowel sounds are normal. There is no distension.     Palpations: Abdomen is soft.     Tenderness: There is no abdominal tenderness.  Musculoskeletal:        General: No swelling or tenderness.     Cervical back: Normal range of motion and neck supple.  Skin:    General: Skin is warm and dry.  Neurological:     General: No focal deficit present.     Mental Status: She is alert and oriented to person, place, and time.     Labs on Admission: I have personally reviewed following labs and imaging studies  CBC: Recent Labs  Lab 08/22/21 1635  WBC 9.6  NEUTROABS 8.7*  HGB 12.5  HCT 40.1  MCV 84.4  PLT 238   Basic Metabolic Panel: Recent Labs  Lab 08/22/21 1635  NA 135  K 3.5  CL 100  CO2 27  GLUCOSE 151*  BUN 8  CREATININE 0.84  CALCIUM 9.5    GFR: Estimated Creatinine Clearance: 69 mL/min (by C-G formula based on SCr of 0.84 mg/dL). Liver Function Tests: Recent Labs  Lab 08/22/21 1635  AST 16  ALT 16  ALKPHOS 89  BILITOT 0.6  PROT 6.8  ALBUMIN 3.7   No results for input(s): LIPASE, AMYLASE in the last 168 hours. No results for input(s): AMMONIA in the last 168 hours. Coagulation Profile: No results for input(s): INR, PROTIME in the last 168 hours. Cardiac Enzymes: No results for input(s): CKTOTAL, CKMB, CKMBINDEX, TROPONINI in the last 168  hours. BNP (last 3 results) No results for input(s): PROBNP in the last 8760 hours. HbA1C: No results for input(s): HGBA1C in the last 72 hours. CBG: No results for input(s): GLUCAP in the last 168 hours. Lipid Profile: No results for input(s): CHOL, HDL, LDLCALC, TRIG, CHOLHDL, LDLDIRECT in the last 72 hours. Thyroid Function Tests: No results for input(s): TSH, T4TOTAL, FREET4, T3FREE, THYROIDAB in the last 72 hours. Anemia Panel: No results for input(s): VITAMINB12, FOLATE, FERRITIN, TIBC, IRON, RETICCTPCT in the last 72 hours. Urine analysis:    Component Value Date/Time   COLORURINE YELLOW 06/01/2011 0513   APPEARANCEUR CLEAR 06/01/2011 0513   LABSPEC 1.011 06/01/2011 0513   PHURINE 7.5 06/01/2011 0513   GLUCOSEU NEGATIVE 06/01/2011 0513   HGBUR TRACE (A) 06/01/2011 0513   BILIRUBINUR NEGATIVE 06/01/2011 0513   KETONESUR NEGATIVE 06/01/2011 0513   PROTEINUR NEGATIVE 06/01/2011 0513   UROBILINOGEN 1.0 06/01/2011 0513   NITRITE NEGATIVE 06/01/2011 0513   LEUKOCYTESUR NEGATIVE 06/01/2011 0513    Radiological Exams on Admission: DG Chest Port 1 View  Result Date: 08/22/2021 CLINICAL DATA:  Cough, fever and congestion since last night. EXAM: PORTABLE CHEST 1 VIEW COMPARISON:  Chest radiograph 10/04/2020 FINDINGS: The heart size and mediastinal contours are within normal limits. Both lungs are clear. No acute osseous abnormality. IMPRESSION: No acute  cardiopulmonary abnormality. Electronically Signed   By: Ileana Roup M.D.   On: 08/22/2021 16:54    EKG: Independently reviewed.  Sinus tachycardia, new T wave abnormality in anterolateral leads.  Assessment/Plan Principal Problem:   COPD exacerbation (HCC) Active Problems:   Hypertension   Hyperlipidemia   Acute hypoxemic respiratory failure (HCC)   Abnormal EKG   Acute hypoxemic respiratory failure secondary to acute COPD exacerbation Oxygen saturation 88-89% on room air, Currently satting well on 2 L supplemental oxygen but still wheezing.  Chest x-ray negative for acute finding.  COVID and influenza PCR negative. -IV Solu-Medrol 60 mg every 8 hours, DuoNeb every 6 hours, Pulmicort neb twice daily, albuterol neb every 2 hours as needed, and Mucinex DM.  Patient was recently treated with a 7-day course of doxycycline.  Check procalcitonin level, if elevated continue antibiotics.  Incentive spirometry, flutter valve.  Continue supplemental oxygen, wean as tolerated.  Abnormal EKG EKG showing new T wave abnormality in anterolateral leads.  ACS less likely as high-sensitivity troponin negative x2 and patient is not endorsing any chest pain.  -Echocardiogram  Tobacco use -NicoDerm patch and counseled to quit.  Diet controlled type 2 diabetes A1c 7.1 on 03/21/2021. -Repeat A1c  Hypertension Stable. -Continue amlodipine  Hyperlipidemia -Continue Lipitor  GERD -Continue Pepcid  DVT prophylaxis: Lovenox Code Status: Full code-discussed with the patient Family Communication: Daughter at bedside Disposition Plan: Status is: Inpatient  Remains inpatient appropriate because: COPD exacerbation with new supplemental oxygen requirement.  Needs scheduled IV steroid and bronchodilator treatment.  Level of care: Level of care: Telemetry Medical  The medical decision making on this patient was of high complexity and the patient is at high risk for clinical deterioration, therefore this  is a level 3 visit.  Shela Leff MD [facility]  If 7PM-7AM, please contact night-coverage [email or web address]  08/23/2021, 1:42 AM

## 2021-08-23 NOTE — ED Notes (Signed)
Breakfast orders placed 

## 2021-08-23 NOTE — ED Notes (Signed)
ED TO INPATIENT HANDOFF REPORT  ED Nurse Name and Phone #: McKaley [phone]  S Name/Age/Gender [name] 70 y.o. female Room/Bed: 025C/025C  Code Status   Code Status: Full Code  Home/SNF/Other Home Patient oriented to: self, place, time, and situation Is this baseline? Yes   Triage Complete: Triage complete  Chief Complaint COPD exacerbation ([place]) [J44.1]  Triage Note Pt BIB EMS for malaise for past 2 days, fever, body aches, cough w/white clear sputum, has hx of COPD   Allergies Allergies  Allergen Reactions   Azithromycin Anaphylaxis and Swelling    Eyes swell   Benazepril Anaphylaxis    Other reaction(s): SWELLING   Cephalexin Anaphylaxis and Swelling   Benadryl [Diphenhydramine Hcl] Nausea And Vomiting   Other Other (See Comments)    Reports allergy to insects. Unsure of reaction.   Nexium [Esomeprazole Magnesium] Palpitations   Spiriva [Tiotropium Bromide Monohydrate] Palpitations    Level of Care/Admitting Diagnosis ED Disposition     ED Disposition  Admit   Condition  --   Comment  Hospital Area: [facility] [[id number]]  Level of Care: Telemetry Medical [104]  May admit patient to [facility] or [facility] if equivalent level of care is available:: Yes  Covid Evaluation: Asymptomatic Screening Protocol (No Symptoms)  Diagnosis: COPD exacerbation [facility]) [id number]  Admitting Physician: Shela Leff [id number]  Attending Physician: Shela Leff [phone]  Estimated length of stay: past midnight tomorrow  Certification:: I certify this patient will need inpatient services for at least 2 midnights          B Medical/Surgery History Past Medical History:  Diagnosis Date   Arthritis    Asthma    COPD (chronic obstructive pulmonary disease) ([place])    Diabetes mellitus    Heart murmur    High cholesterol    Hyperlipidemia    Hypertension    Kidney infection    Kidney stone    Reflux    Past Surgical History:   Procedure Laterality Date   fillopian tube removal     right breast cyst removal       A IV Location/Drains/Wounds Patient Lines/Drains/Airways Status     Active Line/Drains/Airways     Name Placement date Placement time Site Days   Peripheral IV 03/20/21 20 G Left Antecubital 03/20/21  1410  Antecubital  156   Peripheral IV 08/22/21 20 G Anterior;Left Forearm 08/22/21  --  Forearm  1            Intake/Output Last 24 hours No intake or output data in the 24 hours ending 08/23/21 1705  Labs/Imaging Results for orders placed or performed during the hospital encounter of 08/22/21 (from the past 48 hour(s))  CBC with Differential     Status: Abnormal   Collection Time: 08/22/21  4:35 PM  Result Value Ref Range   WBC 9.6 4.0 - 10.5 K/uL   RBC 4.75 3.87 - 5.11 MIL/uL   Hemoglobin 12.5 12.0 - 15.0 g/dL   HCT 40.1 36.0 - 46.0 %   MCV 84.4 80.0 - 100.0 fL   MCH 26.3 26.0 - 34.0 pg   MCHC 31.2 30.0 - 36.0 g/dL   RDW 15.4 11.5 - 15.5 %   Platelets 238 150 - 400 K/uL   nRBC 0.0 0.0 - 0.2 %   Neutrophils Relative % 91 %   Neutro Abs 8.7 (H) 1.7 - 7.7 K/uL   Lymphocytes Relative 6 %   Lymphs Abs 0.5 (L) 0.7 - 4.0  K/uL   Monocytes Relative 2 %   Monocytes Absolute 0.2 0.1 - 1.0 K/uL   Eosinophils Relative 1 %   Eosinophils Absolute 0.1 0.0 - 0.5 K/uL   Basophils Relative 0 %   Basophils Absolute 0.0 0.0 - 0.1 K/uL   Immature Granulocytes 0 %   Abs Immature Granulocytes 0.04 0.00 - 0.07 K/uL    Comment: Performed at [facility] [address] [place], [place] [address]  Comprehensive metabolic panel     Status: Abnormal   Collection Time: 08/22/21  4:35 PM  Result Value Ref Range   Sodium 135 135 - 145 mmol/L   Potassium 3.5 3.5 - 5.1 mmol/L   Chloride 100 98 - 111 mmol/L   CO2 27 22 - 32 mmol/L   Glucose, Bld 151 (H) 70 - 99 mg/dL    Comment: Glucose reference range applies only to samples taken after fasting for at least 8 hours.   BUN 8 8 - 23 mg/dL    Creatinine, Ser 0.84 0.44 - 1.00 mg/dL   Calcium 9.5 8.9 - 10.3 mg/dL   Total Protein 6.8 6.5 - 8.1 g/dL   Albumin 3.7 3.5 - 5.0 g/dL   AST 16 15 - 41 U/L   ALT 16 0 - 44 U/L   Alkaline Phosphatase 89 38 - 126 U/L   Total Bilirubin 0.6 0.3 - 1.2 mg/dL   GFR, Estimated >60 >60 mL/min    Comment: (NOTE) Calculated using the CKD-EPI Creatinine Equation (2021)    Anion gap 8 5 - 15    Comment: Performed at [facility] [address] [place], [place] [address]  Brain natriuretic peptide     Status: Abnormal   Collection Time: 08/22/21  4:35 PM  Result Value Ref Range   B Natriuretic Peptide 112.3 (H) 0.0 - 100.0 pg/mL    Comment: Performed at [facility] [address], [place], [place] [address]  Troponin I (High Sensitivity)     Status: None   Collection Time: 08/22/21  4:35 PM  Result Value Ref Range   Troponin I (High Sensitivity) 5 <18 ng/L    Comment: (NOTE) Elevated high sensitivity troponin I (hsTnI) values and significant  changes across serial measurements may suggest ACS but many other  chronic and acute conditions are known to elevate hsTnI results.  Refer to the "Links" section for chest pain algorithms and additional  guidance. Performed at [facility], [place] [address], [place], [place] [address]   Resp Panel by RT-PCR (Flu A&B, Covid) Nasopharyngeal Swab     Status: None   Collection Time: 08/22/21  5:22 PM   Specimen: Nasopharyngeal Swab; Nasopharyngeal(NP) swabs in vial transport medium  Result Value Ref Range   SARS Coronavirus 2 by RT PCR NEGATIVE NEGATIVE    Comment: (NOTE) SARS-CoV-2 target nucleic acids are NOT DETECTED.  The SARS-CoV-2 RNA is generally detectable in upper respiratory specimens during the acute phase of infection. The lowest concentration of SARS-CoV-2 viral copies this assay can detect is 138 copies/mL. A negative result does not preclude SARS-Cov-2 infection and should not be used as the sole basis for treatment  or other patient management decisions. A negative result may occur with  improper specimen collection/handling, submission of specimen other than nasopharyngeal swab, presence of viral mutation(s) within the areas targeted by this assay, and inadequate number of viral copies(<138 copies/mL). A negative result must be combined with clinical observations, patient history, and epidemiological information.  The expected result is Negative.  Fact Sheet for Patients:  EntrepreneurPulse.com.au  Fact Sheet for Healthcare Providers:  IncredibleEmployment.be  This test is no t yet approved or cleared by the [place] FDA and  has been authorized for detection and/or diagnosis of SARS-CoV-2 by FDA under an Emergency Use Authorization (EUA). This EUA will remain  in effect (meaning this test can be used) for the duration of the COVID-19 declaration under Section 564(b)(1) of the Act, 21 U.S.C.section 360bbb-3(b)(1), unless the authorization is terminated  or revoked sooner.       Influenza A by PCR NEGATIVE NEGATIVE   Influenza B by PCR NEGATIVE NEGATIVE    Comment: (NOTE) The Xpert Xpress SARS-CoV-2/FLU/RSV plus assay is intended as an aid in the diagnosis of influenza from Nasopharyngeal swab specimens and should not be used as a sole basis for treatment. Nasal washings and aspirates are unacceptable for Xpert Xpress SARS-CoV-2/FLU/RSV testing.  Fact Sheet for Patients: EntrepreneurPulse.com.au  Fact Sheet for Healthcare Providers: IncredibleEmployment.be  This test is not yet approved or cleared by the [place] FDA and has been authorized for detection and/or diagnosis of SARS-CoV-2 by FDA under an Emergency Use Authorization (EUA). This EUA will remain in effect (meaning this test can be used) for the duration of the COVID-19 declaration under Section 564(b)(1) of the Act, 21 U.S.C. section  360bbb-3(b)(1), unless the authorization is terminated or revoked.  Performed at [facility], [place] [address], [place], [place] [address]   Culture, blood (routine x 2)     Status: None (Preliminary result)   Collection Time: 08/22/21  5:37 PM   Specimen: BLOOD RIGHT HAND  Result Value Ref Range   Specimen Description BLOOD RIGHT HAND    Special Requests      BOTTLES DRAWN AEROBIC AND ANAEROBIC Blood Culture results may not be optimal due to an inadequate volume of blood received in culture bottles   Culture      NO GROWTH < 12 HOURS Performed at [facility] [address] [place], [place] [address]    Report Status PENDING   Troponin I (High Sensitivity)     Status: None   Collection Time: 08/22/21  8:22 PM  Result Value Ref Range   Troponin I (High Sensitivity) 5 <18 ng/L    Comment: (NOTE) Elevated high sensitivity troponin I (hsTnI) values and significant  changes across serial measurements may suggest ACS but many other  chronic and acute conditions are known to elevate hsTnI results.  Refer to the "Links" section for chest pain algorithms and additional  guidance. Performed at [facility], [place] [address], [place], [place] [address]   HIV Antibody (routine testing w rflx)     Status: None   Collection Time: 08/23/21  2:00 AM  Result Value Ref Range   HIV Screen 4th Generation wRfx Non Reactive Non Reactive    Comment: Performed at [facility], [place] [address] [place]  [address]  Procalcitonin - Baseline     Status: None   Collection Time: 08/23/21  5:00 AM  Result Value Ref Range   Procalcitonin <0.10 ng/mL    Comment:        Interpretation: PCT (Procalcitonin) <= 0.5 ng/mL: Systemic infection (sepsis) is not likely. Local bacterial infection is possible. (NOTE)       Sepsis PCT Algorithm           Lower Respiratory Tract  Infection PCT Algorithm    ----------------------------      ----------------------------         PCT < 0.25 ng/mL                PCT < 0.10 ng/mL          Strongly encourage             Strongly discourage   discontinuation of antibiotics    initiation of antibiotics    ----------------------------     -----------------------------       PCT 0.25 - 0.50 ng/mL            PCT 0.10 - 0.25 ng/mL               OR       >80% decrease in PCT            Discourage initiation of                                            antibiotics      Encourage discontinuation           of antibiotics    ----------------------------     -----------------------------         PCT >= 0.50 ng/mL              PCT 0.26 - 0.50 ng/mL               AND        <80% decrease in PCT             Encourage initiation of                                             antibiotics       Encourage continuation           of antibiotics    ----------------------------     -----------------------------        PCT >= 0.50 ng/mL                  PCT > 0.50 ng/mL               AND         increase in PCT                  Strongly encourage                                      initiation of antibiotics    Strongly encourage escalation           of antibiotics                                     -----------------------------                                           PCT <= 0.25 ng/mL  OR                                        > 80% decrease in PCT                                      Discontinue / Do not initiate                                             antibiotics  Performed at [facility], [place] [address], [place], [place] [address]    DG Chest Port 1 View  Result Date: 08/22/2021 CLINICAL DATA:  Cough, fever and congestion since last night. EXAM: PORTABLE CHEST 1 VIEW COMPARISON:  Chest radiograph 10/04/2020 FINDINGS: The heart size and mediastinal contours are within normal limits. Both lungs are clear. No acute osseous  abnormality. IMPRESSION: No acute cardiopulmonary abnormality. Electronically Signed   By: Ileana Roup M.D.   On: 08/22/2021 16:54   ECHOCARDIOGRAM COMPLETE  Result Date: 08/23/2021    ECHOCARDIOGRAM REPORT   Patient Name:   ESSENCE HEACOCK Date of Exam: 08/23/2021 Medical Rec #:  [id number]       Height:       64.0 in Accession #:    [id number]      Weight:       195.0 lb Date of Birth:  1951/05/29       BSA:          1.935 m Patient Age:    24 years        BP:           111/63 mmHg Patient Gender: F               HR:           62 bpm. Exam Location:  Inpatient Procedure: 2D Echo, 3D Echo, Cardiac Doppler, Color Doppler and Strain Analysis Indications:    Abnormal ECG R94.31  History:        Patient has no prior history of Echocardiogram examinations.                 COPD; Risk Factors:Hypertension, Diabetes, Dyslipidemia and                 Current Smoker.  Sonographer:    Darlina Sicilian RDCS Referring Phys: [id number] [facility] RATHORE  Sonographer Comments: Global longitudinal strain was attempted. IMPRESSIONS  1. Global longitudinal strain is -20.6%. Left ventricular ejection fraction, by estimation, is 55 to 60%. The left ventricle has normal function. The left ventricle has no regional wall motion abnormalities. Left ventricular diastolic parameters were normal.  2. Right ventricular systolic function is normal. The right ventricular size is normal.  3. The mitral valve is normal in structure. Trivial mitral valve regurgitation.  4. The aortic valve is normal in structure. Aortic valve regurgitation is not visualized. FINDINGS  Left Ventricle: Global longitudinal strain is -20.6%. Left ventricular ejection fraction, by estimation, is 55 to 60%. The left ventricle has normal function. The left ventricle has no regional wall motion abnormalities. The left ventricular internal cavity size was normal in size. There is no left ventricular hypertrophy. Left ventricular diastolic parameters were  normal. Right  Ventricle: The right ventricular size is normal. Right vetricular wall thickness was not assessed. Right ventricular systolic function is normal. Left Atrium: Left atrial size was normal in size. Right Atrium: Right atrial size was normal in size. Pericardium: There is no evidence of pericardial effusion. Mitral Valve: The mitral valve is normal in structure. Trivial mitral valve regurgitation. Tricuspid Valve: The tricuspid valve is normal in structure. Tricuspid valve regurgitation is trivial. Aortic Valve: The aortic valve is normal in structure. Aortic valve regurgitation is not visualized. Aortic valve mean gradient measures 3.0 mmHg. Aortic valve peak gradient measures 7.2 mmHg. Aortic valve area, by VTI measures 3.41 cm. Pulmonic Valve: The pulmonic valve was not well visualized. Pulmonic valve regurgitation is not visualized. Aorta: The aortic root is normal in size and structure. IAS/Shunts: No atrial level shunt detected by color flow Doppler.  LEFT VENTRICLE PLAX 2D LVIDd:         4.00 cm   Diastology LVIDs:         3.00 cm   LV e' medial:    11.00 cm/s LV PW:         0.80 cm   LV E/e' medial:  9.0 LV IVS:        0.90 cm   LV e' lateral:   9.21 cm/s LVOT diam:     2.10 cm   LV E/e' lateral: 10.7 LV SV:         93 LV SV Index:   48 LVOT Area:     3.46 cm                           3D Volume EF:                          3D EF:        60 %                          LV EDV:       146 ml                          LV ESV:       58 ml                          LV SV:        87 ml RIGHT VENTRICLE RV S prime:     20.30 cm/s TAPSE (M-mode): 2.1 cm LEFT ATRIUM             Index        RIGHT ATRIUM           Index LA diam:        2.90 cm 1.50 cm/m   RA Area:     10.80 cm LA Vol (A2C):   36.1 ml 18.65 ml/m  RA Volume:   23.10 ml  11.94 ml/m LA Vol (A4C):   43.3 ml 22.37 ml/m LA Biplane Vol: 39.5 ml 20.41 ml/m  AORTIC VALVE AV Area (Vmax):    3.39 cm AV Area (Vmean):   3.70 cm AV Area (VTI):     3.41 cm AV Vmax:            134.00 cm/s AV Vmean:          76.900 cm/s  AV VTI:            0.272 m AV Peak Grad:      7.2 mmHg AV Mean Grad:      3.0 mmHg LVOT Vmax:         131.00 cm/s LVOT Vmean:        82.200 cm/s LVOT VTI:          0.268 m LVOT/AV VTI ratio: 0.98  AORTA Ao Root diam: 3.30 cm MITRAL VALVE MV Area (PHT): 3.84 cm    SHUNTS MV Decel Time: 198 msec    Systemic VTI:  0.27 m MV E velocity: 99.00 cm/s  Systemic Diam: 2.10 cm MV A velocity: 86.20 cm/s MV E/A ratio:  1.15 Dorris Carnes MD Electronically signed by Dorris Carnes MD Signature Date/Time: 08/23/2021/2:51:31 PM    Final     Pending Labs Unresulted Labs (From admission, onward)     Start     Ordered   08/24/21 [id number]  Basic metabolic panel  Tomorrow morning,   R        08/23/21 1037   08/23/21 1350  TSH  Add-on,   AD        08/23/21 1349   08/23/21 0500  Hemoglobin A1c  Tomorrow morning,   R        08/23/21 0106   08/22/21 1636  Culture, blood (routine x 2)  BLOOD CULTURE X 2,   STAT      08/22/21 1635            Vitals/Pain Today's Vitals   08/23/21 1335 08/23/21 1340 08/23/21 1615 08/23/21 1700  BP:   123/84 126/77  Pulse: (!) 105 94 (!) 134 (!) 136  Resp: (!) 24 18 15 18   Temp:    98.1 F (36.7 C)  TempSrc:    Oral  SpO2: 96% 94% 91% 93%  Weight:      Height:      PainSc:        Isolation Precautions No active isolations  Medications Medications  budesonide (PULMICORT) nebulizer solution 2 mg (2 mg Nebulization Given 08/23/21 0841)  ipratropium-albuterol (DUONEB) 0.5-2.5 (3) MG/3ML nebulizer solution 3 mL (3 mLs Nebulization Given 08/23/21 1518)  albuterol (PROVENTIL) (2.5 MG/3ML) 0.083% nebulizer solution 2.5 mg (has no administration in time range)  dextromethorphan-guaiFENesin (MUCINEX DM) 30-600 MG per 12 hr tablet 1 tablet (1 tablet Oral Given 08/23/21 0926)  nicotine (NICODERM CQ - dosed in mg/24 hr) patch 7 mg (7 mg Transdermal Patch Applied 08/23/21 0926)  atorvastatin (LIPITOR) tablet 40 mg (40 mg Oral Given  08/23/21 0926)  famotidine (PEPCID) tablet 20 mg (20 mg Oral Given 08/23/21 0926)  acetaminophen (TYLENOL) tablet 650 mg (has no administration in time range)  methylPREDNISolone sodium succinate (SOLU-MEDROL) 125 mg/2 mL injection 60 mg (has no administration in time range)  diltiazem (CARDIZEM) tablet 30 mg (30 mg Oral Given 08/23/21 1658)  apixaban (ELIQUIS) tablet 5 mg (5 mg Oral Given 08/23/21 1658)  methylPREDNISolone sodium succinate (SOLU-MEDROL) 125 mg/2 mL injection 125 mg (125 mg Intravenous Given 08/22/21 2203)  albuterol (PROVENTIL) (2.5 MG/3ML) 0.083% nebulizer solution 2.5 mg (2.5 mg Nebulization Given 08/22/21 2206)  albuterol (PROVENTIL) (2.5 MG/3ML) 0.083% nebulizer solution 2.5 mg (2.5 mg Nebulization Given 08/22/21 2203)  acetaminophen (TYLENOL) tablet 650 mg (650 mg Oral Given 08/22/21 2202)  albuterol (PROVENTIL) (2.5 MG/3ML) 0.083% nebulizer solution 2.5 mg (2.5 mg Nebulization Given 08/22/21 2302)    Mobility walks Low fall risk   Focused Assessments Cardiac  Assessment Handoff:  Cardiac Rhythm: Atrial fibrillation Lab Results  Component Value Date   TROPONINI <0.30 04/26/2013   No results found for: DDIMER Does the Patient currently have chest pain? No   , Pulmonary Assessment Handoff:  Lung sounds:   O2 Device: Room Air O2 Flow Rate (L/min): 2 L/min    R Recommendations: See Admitting Provider Note  Report given to:   Additional Notes: none

## 2021-08-23 NOTE — Progress Notes (Addendum)
Patient seen and examined, admitted earlier this morning by Dr. Loney Loh -Briefly [name] is a 70/F with history of COPD, ongoing tobacco abuse, type 2 diabetes mellitus, hypertension, dyslipidemia, rheumatoid arthritis and GERD presented to the ED with cough shortness of breath URI and wheezing. -Ongoing symptoms off and on for 3 to 4 weeks, just recently completed a course of prednisone and doxycycline -In the ED she was noted to be wheezing, hypoxic with sats of 88% on room air,, chest x-ray, influenza and COVID PCR were negative, EKG noted T wave abnormality in anterolateral leads  Acute hypoxic respiratory failure COPD exacerbation -Chest x-ray is unremarkable -Continue IV steroids, duo nebs -Add azithromycin  Abnormal EKG -High-sensitivity troponins are negative, follow-up echocardiogram  ADDENDUM: notified by RN at 1:35pm about Rhythm change to Afib, EKG confirms AFib -HR in 100-130s, will add PO Cardizem, chads2vasc score is 4, will start eliquis, FU ECHO, also check TSH  Tobacco abuse -Counseled, nicotine patch  Diet-controlled diabetes mellitus -Hemoglobin A1c was 7.1 in June -CBGs elevated in the setting of steroids, sliding scale insulin  Hypertension -Continue amlodipine  GERD -Continue Pepcid  DVT prophylaxis: Lovenox CODE STATUS: Full code Family communication: Discussed patient in detail, no family at bedside  Zannie Cove, MD

## 2021-08-23 NOTE — Progress Notes (Signed)
  Echocardiogram 2D Echocardiogram has been performed.  [name] 08/23/2021, 9:25 AM

## 2021-08-23 NOTE — ED Notes (Signed)
Pt refusing cardizem and eliquis at this time. States she wants to speak with her daughter first. This RN attempted to explain importance of the med to pt, however she is still refusing at this time.

## 2021-08-23 NOTE — Plan of Care (Signed)

## 2021-08-23 NOTE — ED Notes (Signed)
Pt in new onset Afib RVR per monitor. EKG obtained and Dr. Jomarie Longs notified. Pt is in no acute distress at this time.

## 2021-08-24 ENCOUNTER — Other Ambulatory Visit (HOSPITAL_COMMUNITY): Payer: Self-pay

## 2021-08-24 DIAGNOSIS — J441 Chronic obstructive pulmonary disease with (acute) exacerbation: Secondary | ICD-10-CM | POA: Diagnosis not present

## 2021-08-24 DIAGNOSIS — Z7901 Long term (current) use of anticoagulants: Secondary | ICD-10-CM

## 2021-08-24 DIAGNOSIS — I4891 Unspecified atrial fibrillation: Secondary | ICD-10-CM

## 2021-08-24 LAB — BASIC METABOLIC PANEL
Anion gap: 8 (ref 5–15)
BUN: 20 mg/dL (ref 8–23)
CO2: 31 mmol/L (ref 22–32)
Calcium: 10 mg/dL (ref 8.9–10.3)
Chloride: 99 mmol/L (ref 98–111)
Creatinine, Ser: 0.96 mg/dL (ref 0.44–1.00)
GFR, Estimated: >60 mL/min (ref >60)
Glucose, Bld: 226 mg/dL — ABNORMAL HIGH (ref 70–99)
Potassium: 4.2 mmol/L (ref 3.5–5.1)
Sodium: 138 mmol/L (ref 135–145)

## 2021-08-24 LAB — T4, FREE: Free T4: 1.1 ng/dL (ref 0.61–1.12)

## 2021-08-24 LAB — MAGNESIUM: Magnesium: 2.1 mg/dL (ref 1.7–2.4)

## 2021-08-24 MED ORDER — DILTIAZEM HCL 60 MG PO TABS
60.0000 mg | ORAL_TABLET | Freq: Four times a day (QID) | ORAL | Status: DC
  Administered 2021-08-24 – 2021-08-25 (×4): 60 mg via ORAL
  Filled 2021-08-24 (×2): qty 1

## 2021-08-24 MED ORDER — PREDNISONE 20 MG PO TABS
40.0000 mg | ORAL_TABLET | Freq: Every day | ORAL | Status: DC
  Administered 2021-08-25: 40 mg via ORAL
  Filled 2021-08-24: qty 2

## 2021-08-24 MED ORDER — DILTIAZEM HCL 60 MG PO TABS
ORAL_TABLET | ORAL | Status: AC
  Filled 2021-08-24: qty 1

## 2021-08-24 MED ORDER — METHYLPREDNISOLONE SODIUM SUCC 125 MG IJ SOLR
60.0000 mg | Freq: Two times a day (BID) | INTRAMUSCULAR | Status: AC
  Administered 2021-08-24: 60 mg via INTRAVENOUS

## 2021-08-24 NOTE — TOC Benefit Eligibility Note (Signed)
Patient Advocate Encounter ° °Insurance verification completed.   ° °The patient is currently admitted and upon discharge could be taking Eliquis 5 mg. ° °The current 30 day co-pay is, $0.00.  ° °The patient is insured through [employer] Medicare Part D  ° ° ° °Sherlie Boyum, CPhT °Pharmacy Patient Advocate Specialist °[facility] Patient Advocate Team °Direct Number: [phone]  Fax: [phone] ° ° ° ° ° °  °

## 2021-08-24 NOTE — Evaluation (Signed)
Occupational Therapy Evaluation Patient Details Name: [name] MRN: [id number] DOB: 06-05-1951 Today's Date: 08/24/2021   History of Present Illness 70 y.o. female presented 08/23/21 with complaints of cough and URI symptoms. +COPD exacerbation; flu and COVID negative; afib RVR;  PMH significant of COPD, tobacco use, diet-controlled type II diabetes, hypertension, hyperlipidemia, rheumatoid arthritis, GERD   Clinical Impression   PTA patient was living alone in a 5th floor apartment with elevator access and was grossly I with ADLs/IADLs without AD. Patient currently functioning at baseline baseline demonstrating observed ADLs including grooming standing at sink level and LB dressing with assist for line management only. A-fib with HR ranging from 90's at rest to 109-166bpm with mobility in the hallway. Patient does not require continued acute occupational therapy services at this time with OT to sign off. Patient would benefit from continued mobility with nursing staff for duration of admission.      Recommendations for follow up therapy are one component of a multi-disciplinary discharge planning process, led by the attending physician.  Recommendations may be updated based on patient status, additional functional criteria and insurance authorization.   Follow Up Recommendations  No OT follow up    Assistance Recommended at Discharge PRN  Functional Status Assessment  Patient has not had a recent decline in their functional status  Equipment Recommendations  BSC/3in1    Recommendations for Other Services       Precautions / Restrictions Precautions Precaution Comments: Monitor HR      Mobility Bed Mobility               General bed mobility comments: Patient standing at sink upon entry.    Transfers Overall transfer level: Independent                        Balance Overall balance assessment: No apparent balance deficits (not formally assessed)                                          ADL either performed or assessed with clinical judgement   ADL Overall ADL's : Independent                                             Vision Baseline Vision/History: 0 No visual deficits Vision Assessment?: No apparent visual deficits     Perception     Praxis      Pertinent Vitals/Pain Pain Assessment: No/denies pain     Hand Dominance     Extremity/Trunk Assessment Upper Extremity Assessment Upper Extremity Assessment: Overall WFL for tasks assessed   Lower Extremity Assessment Lower Extremity Assessment: Overall WFL for tasks assessed   Cervical / Trunk Assessment Cervical / Trunk Assessment: Normal   Communication Communication Communication: No difficulties   Cognition Arousal/Alertness: Awake/alert Behavior During Therapy: WFL for tasks assessed/performed Overall Cognitive Status: Within Functional Limits for tasks assessed                                       General Comments  A-fib with HR 90's at rest. 166 Max HR with mobility in hallway. Patient reports no symptoms. Further mobility deferred.  Exercises     Shoulder Instructions      Home Living Family/patient expects to be discharged to:: Private residence Living Arrangements: Alone Available Help at Discharge: Family;Available PRN/intermittently Type of Home: Apartment (5th floor apartment) Home Access: Elevator     Home Layout: One level     Bathroom Shower/Tub: Chief Strategy Officer: Standard     Home Equipment: None          Prior Functioning/Environment Prior Level of Function : Independent/Modified Independent             Mobility Comments: I with household and community mobility ADLs Comments: I with ADLs/IADLs; drives        OT Problem List:        OT Treatment/Interventions:      OT Goals(Current goals can be found in the care plan section) Acute Rehab OT  Goals Patient Stated Goal: To return home. OT Goal Formulation: With patient  OT Frequency:     Barriers to D/C:            Co-evaluation              AM-PAC OT "6 Clicks" Daily Activity     Outcome Measure Help from another person eating meals?: None Help from another person taking care of personal grooming?: None Help from another person toileting, which includes using toliet, bedpan, or urinal?: None Help from another person bathing (including washing, rinsing, drying)?: None Help from another person to put on and taking off regular upper body clothing?: None Help from another person to put on and taking off regular lower body clothing?: None 6 Click Score: 24   End of Session Equipment Utilized During Treatment: Gait belt Nurse Communication: Mobility status;Other (comment) (Vitals with mobility)  Activity Tolerance: Patient tolerated treatment well Patient left: in chair;with call bell/phone within reach  OT Visit Diagnosis: Muscle weakness (generalized) (M62.81)                Time: 3875-6433 OT Time Calculation (min): 18 min Charges:  OT General Charges $OT Visit: 1 Visit OT Evaluation $OT Eval Low Complexity: 1 Low  Courtney Tolin H. OTR/L Supplemental OT, Department of rehab services [phone]  Keshonda Monsour R H. 08/24/2021, 9:36 AM

## 2021-08-24 NOTE — Progress Notes (Signed)
Pt had 3 beat run VT and 1.62 second pause. HR back in 120s, Dr. Imogene Burn aware

## 2021-08-24 NOTE — Progress Notes (Signed)
PROGRESS NOTE    [name]  [id number] DOB: 1951/05/26 DOA: 08/22/2021 PCP: Marzetta Board, NP  Brief Narrative:70/F with history of COPD, ongoing tobacco abuse, type 2 diabetes mellitus, hypertension, dyslipidemia, rheumatoid arthritis and GERD presented to the ED with cough shortness of breath URI and wheezing. -Ongoing symptoms off and on for 3 to 4 weeks, just recently completed a course of prednisone and doxycycline -In the ED she was noted to be wheezing, hypoxic with sats of 88% on room air,, chest x-ray, influenza and COVID PCR were negative, EKG noted T wave abnormality in anterolateral leads   Assessment & Plan:  Acute hypoxic respiratory failure COPD exacerbation -Chest x-ray is unremarkable -Cut down IV steroids, transition to prednisone taper tomorrow -Continue duo nebs   New onset A. fib, RVR  -Started on Cardizem gtt. overnight  -chads2vasc score is 4, started Eliquis yesterday, 2D echo was unremarkable -TSH 0.079, add on free T4 -Will request cardiology eval   Tobacco abuse -Counseled, nicotine patch   Diet-controlled diabetes mellitus -Hemoglobin A1c was 7.1 in June -CBGs elevated in the setting of steroids, sliding scale insulin   Hypertension -Continue amlodipine   GERD -Continue Pepcid  Obesity, BMI 33.4   DVT prophylaxis: Eliquis Code Status: Full code Family Communication: Discussed patient in detail, no family at bedside Disposition Plan:  Status is: Inpatient  Remains inpatient appropriate because: Severity of illness  Consultants:  Cardiology, pending  Procedures:   Antimicrobials:    Subjective: -Feels better, breathing is improving  Objective: Vitals:   08/24/21 0500 08/24/21 0515 08/24/21 0530 08/24/21 0748  BP: 116/88 127/81 121/80 112/68  Pulse: (!) 118 85 83 75  Resp: 17 13 15 16   Temp:    98.2 F (36.8 C)  TempSrc:    Oral  SpO2: 93% 93% 93% 94%  Weight:      Height:        Intake/Output Summary (Last  24 hours) at 08/24/2021 1111 Last data filed at 08/24/2021 0500 Gross per 24 hour  Intake 36.93 ml  Output --  Net 36.93 ml   Filed Weights   08/22/21 1741  Weight: 88.5 kg    Examination:  Gen: Awake, Alert, Oriented X 3, no distress Lungs: Rare expiratory wheezes, improving air movement CVS: S1-S2, irregularly irregular rhythm Abd: soft, Non tender, non distended, BS present Extremities: No edema Skin: no new rashes on exposed skin  Data Reviewed:   CBC: Recent Labs  Lab 08/22/21 1635  WBC 9.6  NEUTROABS 8.7*  HGB 12.5  HCT 40.1  MCV 84.4  PLT 99991111   Basic Metabolic Panel: Recent Labs  Lab 08/22/21 1635 08/24/21 0312  NA 135 138  K 3.5 4.2  CL 100 99  CO2 27 31  GLUCOSE 151* 226*  BUN 8 20  CREATININE 0.84 0.96  CALCIUM 9.5 10.0  MG  --  2.1   GFR: Estimated Creatinine Clearance: 60.4 mL/min (by C-G formula based on SCr of 0.96 mg/dL). Liver Function Tests: Recent Labs  Lab 08/22/21 1635  AST 16  ALT 16  ALKPHOS 89  BILITOT 0.6  PROT 6.8  ALBUMIN 3.7   No results for input(s): LIPASE, AMYLASE in the last 168 hours. No results for input(s): AMMONIA in the last 168 hours. Coagulation Profile: No results for input(s): INR, PROTIME in the last 168 hours. Cardiac Enzymes: No results for input(s): CKTOTAL, CKMB, CKMBINDEX, TROPONINI in the last 168 hours. BNP (last 3 results) No results for input(s): PROBNP in the last  8760 hours. HbA1C: Recent Labs    08/23/21 0500  HGBA1C 7.3*   CBG: No results for input(s): GLUCAP in the last 168 hours. Lipid Profile: No results for input(s): CHOL, HDL, LDLCALC, TRIG, CHOLHDL, LDLDIRECT in the last 72 hours. Thyroid Function Tests: Recent Labs    08/23/21 1820  TSH 0.079*  FREET4 1.10   Anemia Panel: No results for input(s): VITAMINB12, FOLATE, FERRITIN, TIBC, IRON, RETICCTPCT in the last 72 hours. Urine analysis:    Component Value Date/Time   COLORURINE YELLOW 06/01/2011 0513   APPEARANCEUR  CLEAR 06/01/2011 0513   LABSPEC 1.011 06/01/2011 0513   PHURINE 7.5 06/01/2011 0513   GLUCOSEU NEGATIVE 06/01/2011 0513   HGBUR TRACE (A) 06/01/2011 0513   BILIRUBINUR NEGATIVE 06/01/2011 0513   KETONESUR NEGATIVE 06/01/2011 0513   PROTEINUR NEGATIVE 06/01/2011 0513   UROBILINOGEN 1.0 06/01/2011 0513   NITRITE NEGATIVE 06/01/2011 0513   LEUKOCYTESUR NEGATIVE 06/01/2011 0513   Sepsis Labs: @LABRCNTIP (procalcitonin:4,lacticidven:4)  ) Recent Results (from the past 240 hour(s))  Resp Panel by RT-PCR (Flu A&B, Covid) Nasopharyngeal Swab     Status: None   Collection Time: 08/22/21  5:22 PM   Specimen: Nasopharyngeal Swab; Nasopharyngeal(NP) swabs in vial transport medium  Result Value Ref Range Status   SARS Coronavirus 2 by RT PCR NEGATIVE NEGATIVE Final    Comment: (NOTE) SARS-CoV-2 target nucleic acids are NOT DETECTED.  The SARS-CoV-2 RNA is generally detectable in upper respiratory specimens during the acute phase of infection. The lowest concentration of SARS-CoV-2 viral copies this assay can detect is 138 copies/mL. A negative result does not preclude SARS-Cov-2 infection and should not be used as the sole basis for treatment or other patient management decisions. A negative result may occur with  improper specimen collection/handling, submission of specimen other than nasopharyngeal swab, presence of viral mutation(s) within the areas targeted by this assay, and inadequate number of viral copies(<138 copies/mL). A negative result must be combined with clinical observations, patient history, and epidemiological information. The expected result is Negative.  Fact Sheet for Patients:  EntrepreneurPulse.com.au  Fact Sheet for Healthcare Providers:  IncredibleEmployment.be  This test is no t yet approved or cleared by the [place] FDA and  has been authorized for detection and/or diagnosis of SARS-CoV-2 by FDA under an Emergency Use  Authorization (EUA). This EUA will remain  in effect (meaning this test can be used) for the duration of the COVID-19 declaration under Section 564(b)(1) of the Act, 21 U.S.C.section 360bbb-3(b)(1), unless the authorization is terminated  or revoked sooner.       Influenza A by PCR NEGATIVE NEGATIVE Final   Influenza B by PCR NEGATIVE NEGATIVE Final    Comment: (NOTE) The Xpert Xpress SARS-CoV-2/FLU/RSV plus assay is intended as an aid in the diagnosis of influenza from Nasopharyngeal swab specimens and should not be used as a sole basis for treatment. Nasal washings and aspirates are unacceptable for Xpert Xpress SARS-CoV-2/FLU/RSV testing.  Fact Sheet for Patients: EntrepreneurPulse.com.au  Fact Sheet for Healthcare Providers: IncredibleEmployment.be  This test is not yet approved or cleared by the [place] FDA and has been authorized for detection and/or diagnosis of SARS-CoV-2 by FDA under an Emergency Use Authorization (EUA). This EUA will remain in effect (meaning this test can be used) for the duration of the COVID-19 declaration under Section 564(b)(1) of the Act, 21 U.S.C. section 360bbb-3(b)(1), unless the authorization is terminated or revoked.  Performed at [facility], [place] [address], [place], [place] [address]  Culture, blood (routine x 2)     Status: None (Preliminary result)   Collection Time: 08/22/21  5:37 PM   Specimen: BLOOD RIGHT HAND  Result Value Ref Range Status   Specimen Description BLOOD RIGHT HAND  Final   Special Requests   Final    BOTTLES DRAWN AEROBIC AND ANAEROBIC Blood Culture results may not be optimal due to an inadequate volume of blood received in culture bottles   Culture   Final    NO GROWTH 2 DAYS Performed at [facility], [place] [address], [place], [place] [address]    Report Status PENDING  Incomplete  Culture, blood (routine x 2)     Status: None (Preliminary result)    Collection Time: 08/23/21  8:20 AM   Specimen: BLOOD  Result Value Ref Range Status   Specimen Description BLOOD LEFT ANTECUBITAL  Final   Special Requests   Final    BOTTLES DRAWN AEROBIC AND ANAEROBIC Blood Culture adequate volume   Culture   Final    NO GROWTH < 24 HOURS Performed at [facility], [place] [address], [place], [place] [address]    Report Status PENDING  Incomplete         Radiology Studies: DG Chest Port 1 View  Result Date: 08/22/2021 CLINICAL DATA:  Cough, fever and congestion since last night. EXAM: PORTABLE CHEST 1 VIEW COMPARISON:  Chest radiograph 10/04/2020 FINDINGS: The heart size and mediastinal contours are within normal limits. Both lungs are clear. No acute osseous abnormality. IMPRESSION: No acute cardiopulmonary abnormality. Electronically Signed   By: Ileana Roup M.D.   On: 08/22/2021 16:54   ECHOCARDIOGRAM COMPLETE  Result Date: 08/23/2021    ECHOCARDIOGRAM REPORT   Patient Name:   [name] Date of Exam: 08/23/2021 Medical Rec #:  [id number]       Height:       64.0 in Accession #:    [id number]      Weight:       195.0 lb Date of Birth:  [birth year]       BSA:          1.935 m Patient Age:    22 years        BP:           111/63 mmHg Patient Gender: F               HR:           62 bpm. Exam Location:  Inpatient Procedure: 2D Echo, 3D Echo, Cardiac Doppler, Color Doppler and Strain Analysis Indications:    Abnormal ECG R94.31  History:        Patient has no prior history of Echocardiogram examinations.                 COPD; Risk Factors:Hypertension, Diabetes, Dyslipidemia and                 Current Smoker.  Sonographer:    Darlina Sicilian RDCS Referring Phys: [id number] [employer] RATHORE  Sonographer Comments: Global longitudinal strain was attempted. IMPRESSIONS  1. Global longitudinal strain is -20.6%. Left ventricular ejection fraction, by estimation, is 55 to 60%. The left ventricle has normal function. The left ventricle has no regional wall  motion abnormalities. Left ventricular diastolic parameters were normal.  2. Right ventricular systolic function is normal. The right ventricular size is normal.  3. The mitral valve is normal in structure. Trivial mitral valve regurgitation.  4. The aortic  valve is normal in structure. Aortic valve regurgitation is not visualized. FINDINGS  Left Ventricle: Global longitudinal strain is -20.6%. Left ventricular ejection fraction, by estimation, is 55 to 60%. The left ventricle has normal function. The left ventricle has no regional wall motion abnormalities. The left ventricular internal cavity size was normal in size. There is no left ventricular hypertrophy. Left ventricular diastolic parameters were normal. Right Ventricle: The right ventricular size is normal. Right vetricular wall thickness was not assessed. Right ventricular systolic function is normal. Left Atrium: Left atrial size was normal in size. Right Atrium: Right atrial size was normal in size. Pericardium: There is no evidence of pericardial effusion. Mitral Valve: The mitral valve is normal in structure. Trivial mitral valve regurgitation. Tricuspid Valve: The tricuspid valve is normal in structure. Tricuspid valve regurgitation is trivial. Aortic Valve: The aortic valve is normal in structure. Aortic valve regurgitation is not visualized. Aortic valve mean gradient measures 3.0 mmHg. Aortic valve peak gradient measures 7.2 mmHg. Aortic valve area, by VTI measures 3.41 cm. Pulmonic Valve: The pulmonic valve was not well visualized. Pulmonic valve regurgitation is not visualized. Aorta: The aortic root is normal in size and structure. IAS/Shunts: No atrial level shunt detected by color flow Doppler.  LEFT VENTRICLE PLAX 2D LVIDd:         4.00 cm   Diastology LVIDs:         3.00 cm   LV e' medial:    11.00 cm/s LV PW:         0.80 cm   LV E/e' medial:  9.0 LV IVS:        0.90 cm   LV e' lateral:   9.21 cm/s LVOT diam:     2.10 cm   LV E/e' lateral:  10.7 LV SV:         93 LV SV Index:   48 LVOT Area:     3.46 cm                           3D Volume EF:                          3D EF:        60 %                          LV EDV:       146 ml                          LV ESV:       58 ml                          LV SV:        87 ml RIGHT VENTRICLE RV S prime:     20.30 cm/s TAPSE (M-mode): 2.1 cm LEFT ATRIUM             Index        RIGHT ATRIUM           Index LA diam:        2.90 cm 1.50 cm/m   RA Area:     10.80 cm LA Vol (A2C):   36.1 ml 18.65 ml/m  RA Volume:   23.10 ml  11.94 ml/m LA Vol (A4C):  43.3 ml 22.37 ml/m LA Biplane Vol: 39.5 ml 20.41 ml/m  AORTIC VALVE AV Area (Vmax):    3.39 cm AV Area (Vmean):   3.70 cm AV Area (VTI):     3.41 cm AV Vmax:           134.00 cm/s AV Vmean:          76.900 cm/s AV VTI:            0.272 m AV Peak Grad:      7.2 mmHg AV Mean Grad:      3.0 mmHg LVOT Vmax:         131.00 cm/s LVOT Vmean:        82.200 cm/s LVOT VTI:          0.268 m LVOT/AV VTI ratio: 0.98  AORTA Ao Root diam: 3.30 cm MITRAL VALVE MV Area (PHT): 3.84 cm    SHUNTS MV Decel Time: 198 msec    Systemic VTI:  0.27 m MV E velocity: 99.00 cm/s  Systemic Diam: 2.10 cm MV A velocity: 86.20 cm/s MV E/A ratio:  1.15 Dietrich Pates MD Electronically signed by Dietrich Pates MD Signature Date/Time: 08/23/2021/2:51:31 PM    Final         Scheduled Meds:  apixaban  5 mg Oral BID   atorvastatin  40 mg Oral Daily   budesonide (PULMICORT) nebulizer solution  2 mg Nebulization Q12H   dextromethorphan-guaiFENesin  1 tablet Oral BID   famotidine  20 mg Oral BID   methylPREDNISolone (SOLU-MEDROL) injection  60 mg Intravenous Q12H   nicotine  7 mg Transdermal Daily   Continuous Infusions:  diltiazem (CARDIZEM) infusion 15 mg/hr (08/24/21 0859)     LOS: 1 day    Time spent:    Zannie Cove, MD [facility]   08/24/2021, 11:11 AM

## 2021-08-24 NOTE — Progress Notes (Signed)
PT Cancellation Note  Patient Details Name: [name] MRN: [id number] DOB: [birth year]   Cancelled Treatment:    Reason Eval/Treat Not Completed: PT screened, no needs identified, will sign off  Spoke with OT (see OT eval) and pt is independent with mobility (including walking in the hallway). No PT needs and eval deferred.    Jerolyn Center, PT Acute Rehabilitation Services  Pager [phone] Office [phone]  Zena Amos 08/24/2021, 10:11 AM

## 2021-08-24 NOTE — Progress Notes (Signed)
Initial Nutrition Assessment  DOCUMENTATION CODES:   Obesity unspecified  INTERVENTION:  - Encourage PO intake  - Recommend diet liberalization from heart healthy/carb modified to carb modified to provide more meal options for adequate PO intake, spoke with MD who agrees  - Spoke with pt about balanced diet for overall health  NUTRITION DIAGNOSIS:   Increased nutrient needs related to acute illness (COPD exacerbation, new onset afib) as evidenced by estimated needs.  GOAL:   Patient will meet greater than or equal to 90% of their needs  MONITOR:   PO intake, Supplement acceptance, Labs, Weight trends  REASON FOR ASSESSMENT:   Consult Assessment of nutrition requirement/status  ASSESSMENT:   Pt admitted from home with SOB secondary to COPD exacerbation and new onset afib. PMH includes COPD, tobacco use, T2DM, HTN, hyperlipidemia, rheumatoid arthritis, and GERD.  Noted plans for possible TEE guided cardioversion, pending family conversation.  Spoke with pt who states that she eats well and has a good appetite with no changes. She usually only eats 2 big meals a day (breakfast and dinner). A typical day consists of oatmeal, toast, and 2-3 sausage patties, sometimes eggs and bacon for breakfast. For lunch she will sometimes have a sandwich with oranges. Dinner she will make cornbread, cabbage, candied yams and fried chicken. She has snacks throughout the day consisting of graham crackers, ritz crackers or saltines with peanut butter. Beverages include orange soda, mountain dew and coffee. She denies any difficulty chewing or swallowing.  Spoke with pt about importance of balanced meals, moderation of sodium and saturated fat intake to continue to manage diabetes and heart health.  Pt endorses a usual weight of 194-198 lbs and reports small fluctuations in weight but not significant. Per review of chart, noted weight increase since January 2022. Will continue to  monitor.  Medications: pepcid  Labs: HgbA1c 7.3%  NUTRITION - FOCUSED PHYSICAL EXAM:  Flowsheet Row Most Recent Value  Orbital Region No depletion  Upper Arm Region No depletion  Thoracic and Lumbar Region No depletion  Buccal Region No depletion  Temple Region Mild depletion  Clavicle Bone Region No depletion  Clavicle and Acromion Bone Region No depletion  Scapular Bone Region No depletion  Dorsal Hand No depletion  Patellar Region No depletion  Anterior Thigh Region No depletion  Posterior Calf Region No depletion  Edema (RD Assessment) None  Hair Reviewed  Eyes Reviewed  Mouth Reviewed  Skin Reviewed  Nails Reviewed      Diet Order:   Diet Order             Diet heart healthy/carb modified Room service appropriate? Yes; Fluid consistency: Thin  Diet effective now                   EDUCATION NEEDS:   Education needs have been addressed  Skin:  Skin Assessment: Reviewed RN Assessment  Last BM:  PTA  Height:   Ht Readings from Last 1 Encounters:  08/22/21 5\' 4"  (1.626 m)    Weight:   Wt Readings from Last 1 Encounters:  08/22/21 88.5 kg    Ideal Body Weight:  54.5 kg  BMI:  Body mass index is 33.47 kg/m.  Estimated Nutritional Needs:   Kcal:  1600-1800  Protein:  80-90g  Fluid:  >/=1.6L  08/24/21, RDN, LDN Clinical Nutrition

## 2021-08-24 NOTE — Consult Note (Addendum)
CARDIOLOGY CONSULT NOTE  Patient ID: [name] MRN: [id number] DOB/AGE: 01-27-1951 70 y.o.  Admit date: 08/22/2021 Attending physician: Domenic Polite, MD Primary Physician:  Marzetta Board, NP Outpatient Cardiologist: NA Inpatient Cardiologist: Rex Kras, DO, [facility]  Reason of consultation: Atrial Fibrillation w/ RVR Referring physician: Domenic Polite, MD  Chief complaint: shortness of breath   HPI:  [name] is a 70 y.o. African-American female who presents with a chief complaint of " shortness of breath." Her past medical history and cardiovascular risk factors include: COPD, tobacco use, diet-controlled type II diabetes, hypertension, hyperlipidemia, rheumatoid arthritis, GERD .  Patient presents to the hospital with cough and urinary symptoms.  Noted to have wheezing at the time of admission and oxygen saturations between 88-89% on room air.  Patient required supplemental oxygen and medications for acute COPD exacerbation.  Patient is currently being managed by internal medicine service and cardiology was consulted for new onset of atrial fibrillation with rapid ventricular rate.  It appears the patient went into atrial fibrillation yesterday 08/23/2021 noted on telemetry.  Initially placed on oral Cardizem but had to be transitioned to Cardizem drip overnight due to rapid ventricular rate.  Patient is currently rate controlled on IV Cardizem.  She is also started on Eliquis for thromboembolic prophylaxis.  Patient denies any prior history of intracranial or gastrointestinal bleeding.  No risk for falls.  No prior recollection of being in atrial fibrillation.  Patient denies any chest pain at rest or with effort related activities.  High sensitive troponins negative x2.  COVID-19 and flu serologies are also negative.  BNP slightly elevated 112 PG/mL.  Despite her underlying COPD is currently not requiring despite her underlying COPD she continues to smoke.  Not on  home oxygen therapy.  ALLERGIES: Allergies  Allergen Reactions   Azithromycin Anaphylaxis and Swelling    Eyes swell   Benazepril Anaphylaxis    Other reaction(s): SWELLING   Cephalexin Anaphylaxis and Swelling   Benadryl [Diphenhydramine Hcl] Nausea And Vomiting   Other Other (See Comments)    Reports allergy to insects. Unsure of reaction.   Nexium [Esomeprazole Magnesium] Palpitations   Spiriva [Tiotropium Bromide Monohydrate] Palpitations    PAST MEDICAL HISTORY: Past Medical History:  Diagnosis Date   Arthritis    Asthma    COPD (chronic obstructive pulmonary disease) (HCC)    Diabetes mellitus    Heart murmur    High cholesterol    Hyperlipidemia    Hypertension    Kidney infection    Kidney stone    Reflux     PAST SURGICAL HISTORY: Past Surgical History:  Procedure Laterality Date   fillopian tube removal     right breast cyst removal    Right fallopian tube removed.   FAMILY HISTORY: The patient's family history includes CVA in an other family member; Diabetes Mellitus II in an other family member; Hypertension in an other family member.   SOCIAL HISTORY:  The patient  reports that she has been smoking cigarettes. She started smoking about 54 years ago. She has a 26.00 pack-year smoking history. Her smokeless tobacco use includes chew. She reports current alcohol use. She reports that she does not use drugs.  MEDICATIONS: Current Outpatient Medications  Medication Instructions   albuterol (PROVENTIL) 2.5 mg, Nebulization, Every 6 hours PRN   albuterol (VENTOLIN HFA) 108 (90 Base) MCG/ACT inhaler 1 puff, Inhalation, Every 6 hours PRN   amLODipine (NORVASC) 10 mg, Daily   aspirin EC 81 mg, Oral, Daily  atorvastatin (LIPITOR) 40 mg, Oral, Daily   doxycycline (ADOXA) 100 mg, See admin instructions   EPINEPHrine (EPI-PEN) 0.3 mg, Intramuscular, As needed   famotidine (PEPCID) 20 mg, Oral, 2 times daily   fluticasone-salmeterol (ADVAIR) 100-50 MCG/ACT AEPB  1 puff, Inhalation, 2 times daily   gabapentin (NEURONTIN) 100 mg, Oral, 3 times daily PRN   lidocaine (XYLOCAINE) 2 % solution 15 mLs, Mouth/Throat, Every 4 hours PRN   loratadine (CLARITIN) 10 mg, Oral, Daily   Multiple Vitamin (MULTIVITAMIN) capsule 1 capsule, Oral, Daily   predniSONE (DELTASONE) 20 mg, See admin instructions   traMADol (ULTRAM) 50 mg, Oral, Every 6 hours PRN    REVIEW OF SYSTEMS: Review of Systems  Constitutional: Negative for chills and fever.  HENT:  Negative for hoarse voice and nosebleeds.   Eyes:  Negative for discharge, double vision and pain.  Cardiovascular:  Negative for chest pain, claudication, dyspnea on exertion, leg swelling, near-syncope, orthopnea, palpitations, paroxysmal nocturnal dyspnea and syncope.  Respiratory:  Positive for cough and shortness of breath (similar to COPD flare). Negative for hemoptysis.   Musculoskeletal:  Negative for muscle cramps and myalgias.  Gastrointestinal:  Negative for abdominal pain, constipation, diarrhea, hematemesis, hematochezia, melena, nausea and vomiting.  Neurological:  Negative for dizziness and light-headedness.   PHYSICAL EXAM: Vitals with BMI 08/24/2021 08/24/2021 08/24/2021  Height - - -  Weight - - -  BMI - - -  Systolic [id number]  Diastolic 68 80 81  Pulse 75 83 85     Intake/Output Summary (Last 24 hours) at 08/24/2021 1137 Last data filed at 08/24/2021 0500 Gross per 24 hour  Intake 36.93 ml  Output --  Net 36.93 ml    Net IO Since Admission: 36.93 mL [08/24/21 1137]  CONSTITUTIONAL: Well-developed and well-nourished. No acute distress.  SKIN: Skin is warm and dry. No rash noted. No cyanosis. No pallor. No jaundice HEAD: Normocephalic and atraumatic.  EYES: No scleral icterus MOUTH/THROAT: Moist oral membranes.  NECK: No JVD present. No thyromegaly noted. No carotid bruits  LYMPHATIC: No visible cervical adenopathy.  CHEST Normal respiratory effort. No intercostal retractions   LUNGS: Decreased breath sounds bilaterally.   No stridor. No wheezes. No rales.  CARDIOVASCULAR: Irregularly irregular, variable S1-S2, no murmurs rubs or gallops appreciated ABDOMINAL: Obese, soft, nontender, nondistended, positive bowel sounds in all 4 quadrants, no apparent ascites.  EXTREMITIES: No pitting edema, warm to touch.  HEMATOLOGIC: No significant bruising NEUROLOGIC: Oriented to person, place, and time. Nonfocal. Normal muscle tone.  PSYCHIATRIC: Normal mood and affect. Normal behavior. Cooperative  RADIOLOGY: DG Chest Port 1 View  Result Date: 08/22/2021 CLINICAL DATA:  Cough, fever and congestion since last night. EXAM: PORTABLE CHEST 1 VIEW COMPARISON:  Chest radiograph 10/04/2020 FINDINGS: The heart size and mediastinal contours are within normal limits. Both lungs are clear. No acute osseous abnormality. IMPRESSION: No acute cardiopulmonary abnormality. Electronically Signed   By: Ileana Roup M.D.   On: 08/22/2021 16:54   ECHOCARDIOGRAM COMPLETE  Result Date: 08/23/2021    ECHOCARDIOGRAM REPORT   Patient Name:   [name] Date of Exam: 08/23/2021 Medical Rec #:  [id number]       Height:       64.0 in Accession #:    [id number]      Weight:       195.0 lb Date of Birth:  01/25/1951       BSA:          1.935 m Patient Age:  70 years        BP:           111/63 mmHg Patient Gender: F               HR:           62 bpm. Exam Location:  Inpatient Procedure: 2D Echo, 3D Echo, Cardiac Doppler, Color Doppler and Strain Analysis Indications:    Abnormal ECG R94.31  History:        Patient has no prior history of Echocardiogram examinations.                 COPD; Risk Factors:Hypertension, Diabetes, Dyslipidemia and                 Current Smoker.  Sonographer:    Darlina Sicilian RDCS Referring Phys: [id number] [facility]  Sonographer Comments: Global longitudinal strain was attempted. IMPRESSIONS  1. Global longitudinal strain is -20.6%. Left ventricular ejection fraction, by  estimation, is 55 to 60%. The left ventricle has normal function. The left ventricle has no regional wall motion abnormalities. Left ventricular diastolic parameters were normal.  2. Right ventricular systolic function is normal. The right ventricular size is normal.  3. The mitral valve is normal in structure. Trivial mitral valve regurgitation.  4. The aortic valve is normal in structure. Aortic valve regurgitation is not visualized. FINDINGS  Left Ventricle: Global longitudinal strain is -20.6%. Left ventricular ejection fraction, by estimation, is 55 to 60%. The left ventricle has normal function. The left ventricle has no regional wall motion abnormalities. The left ventricular internal cavity size was normal in size. There is no left ventricular hypertrophy. Left ventricular diastolic parameters were normal. Right Ventricle: The right ventricular size is normal. Right vetricular wall thickness was not assessed. Right ventricular systolic function is normal. Left Atrium: Left atrial size was normal in size. Right Atrium: Right atrial size was normal in size. Pericardium: There is no evidence of pericardial effusion. Mitral Valve: The mitral valve is normal in structure. Trivial mitral valve regurgitation. Tricuspid Valve: The tricuspid valve is normal in structure. Tricuspid valve regurgitation is trivial. Aortic Valve: The aortic valve is normal in structure. Aortic valve regurgitation is not visualized. Aortic valve mean gradient measures 3.0 mmHg. Aortic valve peak gradient measures 7.2 mmHg. Aortic valve area, by VTI measures 3.41 cm. Pulmonic Valve: The pulmonic valve was not well visualized. Pulmonic valve regurgitation is not visualized. Aorta: The aortic root is normal in size and structure. IAS/Shunts: No atrial level shunt detected by color flow Doppler.  LEFT VENTRICLE PLAX 2D LVIDd:         4.00 cm   Diastology LVIDs:         3.00 cm   LV e' medial:    11.00 cm/s LV PW:         0.80 cm   LV E/e'  medial:  9.0 LV IVS:        0.90 cm   LV e' lateral:   9.21 cm/s LVOT diam:     2.10 cm   LV E/e' lateral: 10.7 LV SV:         93 LV SV Index:   48 LVOT Area:     3.46 cm                           3D Volume EF:  3D EF:        60 %                          LV EDV:       146 ml                          LV ESV:       58 ml                          LV SV:        87 ml RIGHT VENTRICLE RV S prime:     20.30 cm/s TAPSE (M-mode): 2.1 cm LEFT ATRIUM             Index        RIGHT ATRIUM           Index LA diam:        2.90 cm 1.50 cm/m   RA Area:     10.80 cm LA Vol (A2C):   36.1 ml 18.65 ml/m  RA Volume:   23.10 ml  11.94 ml/m LA Vol (A4C):   43.3 ml 22.37 ml/m LA Biplane Vol: 39.5 ml 20.41 ml/m  AORTIC VALVE AV Area (Vmax):    3.39 cm AV Area (Vmean):   3.70 cm AV Area (VTI):     3.41 cm AV Vmax:           134.00 cm/s AV Vmean:          76.900 cm/s AV VTI:            0.272 m AV Peak Grad:      7.2 mmHg AV Mean Grad:      3.0 mmHg LVOT Vmax:         131.00 cm/s LVOT Vmean:        82.200 cm/s LVOT VTI:          0.268 m LVOT/AV VTI ratio: 0.98  AORTA Ao Root diam: 3.30 cm MITRAL VALVE MV Area (PHT): 3.84 cm    SHUNTS MV Decel Time: 198 msec    Systemic VTI:  0.27 m MV E velocity: 99.00 cm/s  Systemic Diam: 2.10 cm MV A velocity: 86.20 cm/s MV E/A ratio:  1.15 Courtney Carnes MD Electronically signed by Courtney Carnes MD Signature Date/Time: 08/23/2021/2:51:31 PM    Final     LABORATORY DATA: Lab Results  Component Value Date   WBC 9.6 08/22/2021   HGB 12.5 08/22/2021   HCT 40.1 08/22/2021   MCV 84.4 08/22/2021   PLT 238 08/22/2021    Recent Labs  Lab 08/22/21 1635 08/24/21 0312  NA 135 138  K 3.5 4.2  CL 100 99  CO2 27 31  BUN 8 20  CREATININE 0.84 0.96  CALCIUM 9.5 10.0  PROT 6.8  --   BILITOT 0.6  --   ALKPHOS 89  --   ALT 16  --   AST 16  --   GLUCOSE 151* 226*    Lipid Panel  No results found for: CHOL, HDL, LDLCALC, LDLDIRECT, TRIG, CHOLHDL  BNP (last 3  results) Recent Labs    08/22/21 1635  BNP 112.3*    HEMOGLOBIN A1C Lab Results  Component Value Date   HGBA1C 7.3 (H) 08/23/2021   MPG 163 08/23/2021    Cardiac Panel (last 3 results) Recent Labs    08/22/21  1635 08/22/21 2022  TROPONINIHS 5 5     TSH Recent Labs    08/23/21 1820  TSH 0.079*     CARDIAC DATABASE: EKG: 08/22/2021: Sinus tach, 106 bpm, normal axis, subtle ST-T changes in the anterolateral leads, without underlying injury pattern.  08/23/2021 1333: A. fib RVR, 123 bpm, diffuse nonspecific ST-T changes likely secondary to rate related but ischemia cannot be ruled out.  Echocardiogram: 08/23/2021: LVEF 55-60%, normal wall motion, right ventricular size and function normal, trivial MR, global longitudinal strain -20.6%  Stress Testing:  None  Heart Catheterization: None  IMPRESSION & RECOMMENDATIONS: [name] is a 70 y.o. African-American female whose past medical history and cardiovascular risk factors include: COPD, tobacco use, diet-controlled type II diabetes, hypertension, hyperlipidemia, rheumatoid arthritis, GERD .  Impression:  Atrial fibrillation with rapid ventricular rate: Currently rate controlled Rate control: IV Cardizem Rhythm control: N/A Thromboembolic prophylaxis: Eliquis Newly discovered in the setting of acute COPD exacerbation, hypoxia, thyroid function. Recommend transitioning her from IV Cardizem to oral Cardizem 60 mg every 6 hours with holding parameters. Spoke to the her RN to give the oral Cardizem now and 2 hours later start weaning off the IV drip. Had a detailed discussion with the patient with regards to considering TEE guided cardioversion tomorrow 08/25/2021 versus rate control strategy and outpatient follow-up.  If she remains in A. fib despite being on therapy for 4 weeks may consider elective TEE guided cardioversion.  Risks, benefits, and alternatives discussed.  She would like to discuss this with her  daughter will be coming in later this afternoon prior to making her decision.  Long-term oral anticoagulation: Indication: A. fib (chronicity unknown) No prior history of intracranial or gastrointestinal bleeding. No prior falls. CHA2DS2-VASc SCORE is 4 which correlates to 4% risk of stroke per year (HTN, age, diabetes, gender) Discussed risks, benefits, and alternatives to oral anticoagulation.  Patient has been educated on the importance of monitoring for evidence of bleeding which includes but not limited to hemoptysis, hematochezia, melanotic stools, and hematuria (explained in layman terms). Patient educated on fall precautions and if she was to be injured despite the mechanism of injury she is to seek medical attention at the closest ER since she is on blood thinners.  Patient understands importance of this because if internal bleeding is not treated in a timely manner it may further lead to morbidity and/or mortality.  Patient voices understanding of these recommendations and provides verbal feedback. Anticoagulation started 08/23/2021. Hemoglobin on 08/22/2021 12.5 g/dL.  (For reference).  Abnormal EKG: In the setting of atrial fibrillation with rapid ventricular rate patient did have ST-T changes noted diffusely on EKG.  This may be secondary to her underlying rapid ventricular rate but ischemia cannot be ruled out.  In the setting of no chest pain, negative high sensitive troponins, recommend outpatient follow-up with stress test in the setting of EKG findings and new onset of A. fib.  Patient verbalizes understanding. Repeat EKG.  Diet-controlled diabetes: Recent hemoglobin A1c 7.3.  Consider pharmacological therapy.  Will defer to primary team.  Hyperlipidemia: Continue statin therapy.  Hypertension: When appropriate consider addition of ARB given her diabetes.  Smoking: Educated on the importance of complete smoking cessation.  Especially in the setting of COPD and hypoxia.   Patient appears to be motivated and currently has a nicotine patch.  TSH is below normal limits.  Will defer management of thyroid disease to primary team.  Continue your care with regards to her other active problem  list and chronic comorbid conditions.  Thank you for allowing [place] to participate in the care of [name] please reach out if any questions or concerns arise.   Total encounter time 88 minutes. *Total Encounter Time as defined by the Centers for Medicare and Medicaid Services includes, in addition to the face-to-face time of a patient visit (documented in the note above) non-face-to-face time: obtaining and reviewing outside history, ordering and reviewing medications, tests or procedures, care coordination (communications with other health care professionals or caregivers) and documentation in the medical record.  Patient's questions and concerns were addressed to her satisfaction. She voices understanding of the instructions provided during this encounter.   This note was created using a voice recognition software as a result there may be grammatical errors inadvertently enclosed that do not reflect the nature of this encounter. Every attempt is made to correct such errors.  [name] [facility]  Pager: [phone] Office: [phone] 08/24/2021, 11:37 AM   ADDENDUM: I was called to the patient's nurse to discuss TEE guided cardioversion with her daughter [name] who had come to visit her.  Reviewed the risks, benefits, and the alternatives to transesophageal guided cardioversion with both the patient and her daughter.  Shared decision is to hold off on TEE guided cardioversion for now as long as her ventricular rate is controlled on oral medical therapy.  Otherwise, if she continues to have episodes of A. fib with RVR overnight consider TEE guided cardioversion either tomorrow or Thursday.  As long as her rate is well controlled on oral Cardizem.  She can follow-up as  outpatient in 4 weeks for reevaluation.  If she remains in A. fib we will consider elective cardioversion as outpatient.  The daughter was thankful for me to come back and discuss the scope with the procedure, risks and benefits.  Tessa Lerner, [place], [facility]  Pager: [phone] Office: [phone]

## 2021-08-25 DIAGNOSIS — R0902 Hypoxemia: Secondary | ICD-10-CM

## 2021-08-25 DIAGNOSIS — J441 Chronic obstructive pulmonary disease with (acute) exacerbation: Secondary | ICD-10-CM | POA: Diagnosis not present

## 2021-08-25 LAB — CBC
HCT: 38.4 % (ref 36.0–46.0)
Hemoglobin: 12.5 g/dL (ref 12.0–15.0)
MCH: 27.1 pg (ref 26.0–34.0)
MCHC: 32.6 g/dL (ref 30.0–36.0)
MCV: 83.1 fL (ref 80.0–100.0)
Platelets: 244 10*3/uL (ref 150–400)
RBC: 4.62 MIL/uL (ref 3.87–5.11)
RDW: 15.3 % (ref 11.5–15.5)
WBC: 22.5 10*3/uL — ABNORMAL HIGH (ref 4.0–10.5)
nRBC: 0 % (ref 0.0–0.2)

## 2021-08-25 LAB — BASIC METABOLIC PANEL
Anion gap: 9 (ref 5–15)
BUN: 27 mg/dL — ABNORMAL HIGH (ref 8–23)
CO2: 26 mmol/L (ref 22–32)
Calcium: 9.3 mg/dL (ref 8.9–10.3)
Chloride: 97 mmol/L — ABNORMAL LOW (ref 98–111)
Creatinine, Ser: 1.04 mg/dL — ABNORMAL HIGH (ref 0.44–1.00)
GFR, Estimated: 59 mL/min — ABNORMAL LOW (ref >60)
Glucose, Bld: 335 mg/dL — ABNORMAL HIGH (ref 70–99)
Potassium: 4.1 mmol/L (ref 3.5–5.1)
Sodium: 132 mmol/L — ABNORMAL LOW (ref 135–145)

## 2021-08-25 LAB — GLUCOSE, CAPILLARY
Glucose-Capillary: 375 mg/dL — ABNORMAL HIGH (ref 70–99)
Glucose-Capillary: 215 mg/dL — ABNORMAL HIGH (ref 70–99)

## 2021-08-25 MED ORDER — INSULIN ASPART 100 UNIT/ML IJ SOLN
0.0000 [IU] | Freq: Three times a day (TID) | INTRAMUSCULAR | Status: DC
Start: 2021-08-25 — End: 2021-08-27
  Administered 2021-08-25: 15 [IU] via SUBCUTANEOUS
  Administered 2021-08-26 – 2021-08-27 (×2): 3 [IU] via SUBCUTANEOUS
  Administered 2021-08-27: 2 [IU] via SUBCUTANEOUS

## 2021-08-25 MED ORDER — PREDNISONE 5 MG PO TABS
30.0000 mg | ORAL_TABLET | Freq: Every day | ORAL | Status: DC
  Administered 2021-08-27: 30 mg via ORAL
  Filled 2021-08-25 (×2): qty 2

## 2021-08-25 MED ORDER — DILTIAZEM HCL ER COATED BEADS 180 MG PO CP24
180.0000 mg | ORAL_CAPSULE | Freq: Every day | ORAL | Status: DC
  Administered 2021-08-25: 180 mg via ORAL
  Filled 2021-08-25 (×2): qty 1

## 2021-08-25 MED ORDER — POLYETHYLENE GLYCOL 3350 17 G PO PACK
17.0000 g | PACK | Freq: Every day | ORAL | Status: DC
  Administered 2021-08-25 – 2021-08-27 (×3): 17 g via ORAL
  Filled 2021-08-25 (×3): qty 1

## 2021-08-25 MED ORDER — BUDESONIDE 0.5 MG/2ML IN SUSP
0.5000 mg | Freq: Two times a day (BID) | RESPIRATORY_TRACT | Status: DC
  Administered 2021-08-25 – 2021-08-27 (×3): 0.5 mg via RESPIRATORY_TRACT
  Filled 2021-08-25 (×4): qty 2

## 2021-08-25 MED ORDER — INSULIN GLARGINE-YFGN 100 UNIT/ML ~~LOC~~ SOLN
10.0000 [IU] | Freq: Every day | SUBCUTANEOUS | Status: DC
  Administered 2021-08-25 – 2021-08-27 (×3): 10 [IU] via SUBCUTANEOUS
  Filled 2021-08-25 (×3): qty 0.1

## 2021-08-25 NOTE — Progress Notes (Addendum)
Subjective:  Patient seen and examined at approximately 8:15 AM at bedside. Patient's daughter is present at bedside. Patient is feeling well overall without chest pain, dyspnea, dizziness, syncope, near syncope. Denies evidence of bleeding.  Intake/Output from previous day:  I/O last 3 completed shifts: In: 158.2 [I.V.:158.2] Out: -  No intake/output data recorded.  Blood pressure 117/63, pulse 99, temperature 97.8 F (36.6 C), temperature source Oral, resp. rate 15, height _0  (1.626 m), weight 88.5 kg, SpO2 96 %. Physical Exam Vitals reviewed.  HENT:     Head: Normocephalic and atraumatic.  Cardiovascular:     Rate and Rhythm: Normal rate. Rhythm irregularly irregular.     Pulses: Intact distal pulses.     Heart sounds: S1 normal and S2 normal. No murmur heard.   No gallop.     Comments: Variable S1-S2 Pulmonary:     Effort: Pulmonary effort is normal. No respiratory distress.     Breath sounds: No wheezing, rhonchi or rales.  Abdominal:     General: Bowel sounds are normal. There is no distension.     Palpations: Abdomen is soft.  Musculoskeletal:     Right lower leg: No edema.     Left lower leg: No edema.  Skin:    General: Skin is warm and dry.  Neurological:     Mental Status: She is alert.   Lab Results: BMP BNP (last 3 results) Recent Labs    08/22/21 1635  BNP 112.3*    ProBNP (last 3 results) No results for input(s): PROBNP in the last 8760 hours. BMP Latest Ref Rng & Units 08/25/2021 08/24/2021 08/22/2021  Glucose 70 - 99 mg/dL 335(H) 226(H) 151(H)  BUN 8 - 23 mg/dL 27(H) 20 8  Creatinine 0.44 - 1.00 mg/dL 1.04(H) 0.96 0.84  Sodium 135 - 145 mmol/L 132(L) 138 135  Potassium 3.5 - 5.1 mmol/L 4.1 4.2 3.5  Chloride 98 - 111 mmol/L 97(L) 99 100  CO2 22 - 32 mmol/L _1 Calcium 8.9 - 10.3 mg/dL 9.3 10.0 9.5   Hepatic Function Latest Ref Rng & Units 08/22/2021 09/01/2012 08/10/2007  Total Protein 6.5 - 8.1 g/dL 6.8 6.8 6.6  Albumin 3.5 - 5.0  g/dL 3.7 3.7 3.7  AST 15 - 41 U/L _2 ALT 0 - 44 U/L _3 Alk Phosphatase 38 - 126 U/L 89 70 88  Total Bilirubin 0.3 - 1.2 mg/dL 0.6 0.3 0.7   CBC Latest Ref Rng & Units 08/25/2021 08/22/2021 03/21/2021  WBC 4.0 - 10.5 K/uL 22.5(H) 9.6 12.1(H)  Hemoglobin 12.0 - 15.0 g/dL 12.5 12.5 12.5  Hematocrit 36.0 - 46.0 % 38.4 40.1 40.1  Platelets 150 - 400 K/uL 244 238 252   Lipid Panel  No results found for: CHOL, TRIG, HDL, CHOLHDL, VLDL, LDLCALC, LDLDIRECT Cardiac Panel (last 3 results) No results for input(s): CKTOTAL, CKMB, TROPONINI, RELINDX in the last 72 hours.  HEMOGLOBIN A1C Lab Results  Component Value Date   HGBA1C 7.3 (H) 08/23/2021   MPG 163 08/23/2021   TSH Recent Labs    08/23/21 1820  TSH 0.079*   Imaging: No results found.  Cardiac Studies:  Echocardiogram: 08/23/2021: LVEF 55-60%, normal wall motion, right ventricular size and function normal, trivial MR, global longitudinal strain -20.6%   Stress Testing:  None   Heart Catheterization: None  EKG:  08/22/2021: Sinus tach, 106 bpm, normal axis, subtle ST-T changes in the anterolateral leads, without underlying injury pattern.   08/23/2021 1333:  A. fib RVR, 123 bpm, diffuse nonspecific ST-T changes likely secondary to rate related but ischemia cannot be ruled out.  08/25/2021: Atrial fibrillation, 108 bpm.  Diffuse nonspecific ST-T changes unchanged compared to previous.  Telemetry:  Atrial fibrillation relatively well rate controlled approximately 90-100 bpm, max 130 bpm   Scheduled Meds:  apixaban  5 mg Oral BID   atorvastatin  40 mg Oral Daily   budesonide (PULMICORT) nebulizer solution  2 mg Nebulization Q12H   dextromethorphan-guaiFENesin  1 tablet Oral BID   diltiazem  60 mg Oral Q6H   famotidine  20 mg Oral BID   nicotine  7 mg Transdermal Daily   predniSONE  40 mg Oral Q breakfast   Continuous Infusions:  diltiazem (CARDIZEM) infusion 5 mg/hr (08/24/21 1712)   PRN  Meds:.acetaminophen, albuterol  Assessment/Plan:  [name] is a 70 y.o. African-American female whose past medical history and cardiovascular risk factors include: COPD, tobacco use, diet-controlled type II diabetes, hypertension, hyperlipidemia, rheumatoid arthritis, GERD .   Impression:  Atrial fibrillation with rapid ventricular rate: Ventricular rate better controlled-but RVR with minimal activity Rate control: oral Cardizem Rhythm control: N/A Thromboembolic prophylaxis: Eliquis Newly discovered in the setting of acute COPD exacerbation, hypoxia, thyroid dysfunction. Recommend transitioning her from oral Cardizem 60 mg every 6 hours to long-acting oral Cardizem 180 mg once daily with holding parameters. Again discussed at length with patient and her daughter who is present at bedside regarding TEE guided cardioversion versus rate control strategy and outpatient follow-up. Shared decision was to proceed with TEE guided cardioversion tomorrow morning. Again review risks and benefits, patient verbalized understanding and wishes to proceed.    Long-term oral anticoagulation: Indication: A. fib (chronicity unknown) No prior history of intracranial or gastrointestinal bleeding. No prior falls. CHA2DS2-VASc SCORE is 4 which correlates to 4% risk of stroke per year (HTN, age, diabetes, gender) Patient and daughter are aware of risks, benefits, and alternatives to oral anticoagulation.  Patient is aware of recommendations regarding monitoring for evidence of bleeding and evaluation in the context of fall or injury. Anticoagulation started 08/23/2021.   Abnormal EKG: Suspect nonspecific ST-T wave changes to be related to rapid ventricular late, however ischemia cannot be excluded.  Given no chest pain and negative troponins recommend outpatient follow-up with stress test.   Diet-controlled diabetes: Will defer management to primary team.   Hyperlipidemia: Continue statin therapy.    Hypertension: When appropriate consider addition of ARB given her diabetes.   Smoking: Reiterated the importance of smoking cessation, particularly given COPD and hypoxia.    Thyroid dysfunction: We will defer management to primary team.   Patient was seen in collaboration with Dr. Terri Skains. He also reviewed patient's chart and examined the patient. Dr. Terri Skains is in agreement of the plan.    Alethia Berthold, PA-C 08/25/2021, 8:27 AM Office: [phone]  ADDENDUM: Patient seen and examined independently. Note reviewed with the Lawerance Cruel, PA and relevant changes were made to her note if needed.  I personally reviewed laboratory data, imaging studies and relevant notes.  I independently examined the patient and formulated the important aspects of the plan.  I have personally discussed the plan with the patient and daughter.  Comments or changes to the note/plan are indicated below.  My Exam:  Vitals with BMI 08/25/2021 08/25/2021 08/25/2021  Height - - -  Weight - - -  BMI - - -  Systolic 630 160 109  Diastolic 98 77 63  Pulse 96 95 99  CONSTITUTIONAL: Well-developed and well-nourished. No acute distress.  SKIN: Skin is warm and dry. No rash noted. No cyanosis. No pallor. No jaundice HEAD: Normocephalic and atraumatic.  EYES: No scleral icterus MOUTH/THROAT: Moist oral membranes.  NECK: No JVD present. No thyromegaly noted. No carotid bruits  LYMPHATIC: No visible cervical adenopathy.  CHEST Normal respiratory effort. No intercostal retractions  LUNGS: Clear to auscultation bilaterally.  No stridor. No wheezes. No rales.  CARDIOVASCULAR: Irregularly irregular, variable S1-S2 no murmurs rubs or gallops appreciated ABDOMINAL: Obese, soft, nontender, nondistended, positive bowel sounds in all 4 quadrants no apparent ascites.  EXTREMITIES: No peripheral edema  HEMATOLOGIC: No significant bruising NEUROLOGIC: Oriented to person, place, and time. Nonfocal. Normal muscle  tone.  PSYCHIATRIC: Normal mood and affect. Normal behavior. Cooperative   Assessment & Plan:  Symptomatic atrial fibrillation Newly discovered  Has transitioned off of Cardizem drip to oral Cardizem and the rates have improved while at rest.  However with minimal exertion ventricular rate still greater than 120 bpm.  I spoke to the patient and daughter during morning rounds as well as in the evening and the shared decision was to proceed with TEE guided direct-current cardioversion.  After careful review of history and examination, the risks, benefits of transesophageal echocardiogram, and alternatives have been explained to the patient and daughter Katrina. Complications include but not limited to esophageal perforation (rare), gastrointestinal bleeding (rare), cardiac arrhythmia which can include cardiac arrest and death (rare), pharyngeal irritation / discomfort with swallowing / hematoma, methemoglobinemia, bronchospasm, transient hypoxia, nonsustained ventricular tachycardia, transient atrial fibrillation, minimal hemoptysis, vomiting, hypotension, respiratory compromise, reaction to medications, unavoidable damage to teeth and gums, aspiration pneumonia  were reviewed with the patient.  Patient and daughter voices understands, provides verbal feedback, questions answered, and patient/daughter Katrina wishes to proceed with the procedure.  Risks, benefits, and alternatives of direct current cardioversion reviewed with the and patient and daughter Katrina. Risk includes but not limited to: potential for post-cardioversion rhythms, life-threatening arrhythmias (ventricular tachycardia and fibrillation, profound bradycardia, cardiac arrest), myocardial damage, acute pulmonary edema, skin burns, transient hypotension. Benefits include restoration of sinus rhythm. Alternatives to treatment were discussed, questions were answered, patient voices understanding and provides verbal feedback.  Patient is  willing to proceed.   Time Spent with Patient: I have spent a total of 37 minutes with patient reviewing hospital notes, telemetry, EKGs, labs and examining the patient as well as establishing an assessment and plan that was discussed with the patient.  > 50% of time was spent in direct patient care.  This note was created using a voice recognition software as a result there may be grammatical errors inadvertently enclosed that do not reflect the nature of this encounter. Every attempt is made to correct such errors.   Mechele Claude [facility]  Pager: [phone] Office: [phone] 08/25/2021 6:14 PM

## 2021-08-25 NOTE — Progress Notes (Signed)
Patient alert and oriented x4. No acute distress noted. Heart rate fluctuates between 80-110. Patient to have cardioversion soon. Will continue to monitor.  08/25/21 2024  Level of Consciousness  Level of Consciousness Alert  MEWS COLOR  MEWS Score Color Yellow  Oxygen Therapy  O2 Device Room Air  MEWS Score  MEWS Temp 0  MEWS Systolic 0  MEWS Pulse 2  MEWS RR 0  MEWS LOC 0  MEWS Score 2

## 2021-08-25 NOTE — Progress Notes (Signed)
PROGRESS NOTE    [name]  [id number] DOB: Aug 05, 1951 DOA: 08/22/2021 PCP: Marzetta Board, NP  Brief Narrative:70/F with history of COPD, ongoing tobacco abuse, type 2 diabetes mellitus, hypertension, dyslipidemia, rheumatoid arthritis and GERD presented to the ED with cough shortness of breath URI and wheezing. -Hypoxic in the ED, chest x-ray clear, COPD exacerbation improving -Hospital course complicated by new onset A. fib, RVR-started Cardizem, Eliquis, cardiology consulting  Assessment & Plan:  Acute hypoxic respiratory failure COPD exacerbation -Chest x-ray is unremarkable -Clinically improved, treated with steroids nebs, transitioned to prednisone taper -Weaned off O2   New onset A. fib, RVR  -Started on Cardizem gtt., subsequently transitioned to p.o. Cardizem -chads2vasc score is 4, started Eliquis, 2D echo was unremarkable -TSH 0.079, free T4 is normal-possibly sick euthyroid state, will need TSH/T4 repeated in 6 weeks -Appreciate cardiology input, may need TEE/DC cardioversion if heart rate not stable/improved on Cardizem   Tobacco abuse -Counseled, nicotine patch   Diet-controlled diabetes mellitus -Hemoglobin A1c was 7.1 in June -CBGs elevated in the setting of steroids, sliding scale insulin -Add 10 units of Semglee   Hypertension -Continue amlodipine   GERD -Continue Pepcid  Obesity, BMI 33.4   DVT prophylaxis: Eliquis Code Status: Full code Family Communication: Discussed with patient and daughter at bedside Disposition Plan:  Status is: Inpatient  Remains inpatient appropriate because: Severity of illness  Consultants:  Cardiology, pending  Procedures:   Antimicrobials:    Subjective: -Feels better overall, breathing is improving, heart rate in the 95-1 20 range at rest  Objective: Vitals:   08/24/21 1947 08/24/21 2343 08/25/21 0803 08/25/21 1141  BP:  (!) 139/96 117/63 131/77  Pulse: 77 74 99 95  Resp: 19 15 15 18   Temp:   98.2 F (36.8 C) 97.8 F (36.6 C) 98.4 F (36.9 C)  TempSrc:  Oral Oral Oral  SpO2:  95% 96% 95%  Weight:      Height:        Intake/Output Summary (Last 24 hours) at 08/25/2021 1346 Last data filed at 08/25/2021 1330 Gross per 24 hour  Intake 841.25 ml  Output --  Net 841.25 ml   Filed Weights   08/22/21 1741  Weight: 88.5 kg    Examination:  Gen: Pleasant female sitting up in bed, AAOx3, no distress HEENT: No JVD CVS: S1-S2, irregularly irregular rhythm Lungs: Improved air movement, no expiratory wheezes today Abdomen: Soft, nontender, bowel sounds present  Extremities: No edema Skin: no new rashes on exposed skin  Data Reviewed:   CBC: Recent Labs  Lab 08/22/21 1635 08/25/21 0204  WBC 9.6 22.5*  NEUTROABS 8.7*  --   HGB 12.5 12.5  HCT 40.1 38.4  MCV 84.4 83.1  PLT 238 [id number]   Basic Metabolic Panel: Recent Labs  Lab 08/22/21 1635 08/24/21 0312 08/25/21 0204  NA 135 138 132*  K 3.5 4.2 4.1  CL 100 99 97*  CO2 27 31 26   GLUCOSE 151* 226* 335*  BUN 8 20 27*  CREATININE 0.84 0.96 1.04*  CALCIUM 9.5 10.0 9.3  MG  --  2.1  --    GFR: Estimated Creatinine Clearance: 55.7 mL/min (A) (by C-G formula based on SCr of 1.04 mg/dL (H)). Liver Function Tests: Recent Labs  Lab 08/22/21 1635  AST 16  ALT 16  ALKPHOS 89  BILITOT 0.6  PROT 6.8  ALBUMIN 3.7   No results for input(s): LIPASE, AMYLASE in the last 168 hours. No results for input(s): AMMONIA in the  last 168 hours. Coagulation Profile: No results for input(s): INR, PROTIME in the last 168 hours. Cardiac Enzymes: No results for input(s): CKTOTAL, CKMB, CKMBINDEX, TROPONINI in the last 168 hours. BNP (last 3 results) No results for input(s): PROBNP in the last 8760 hours. HbA1C: Recent Labs    08/23/21 0500  HGBA1C 7.3*   CBG: No results for input(s): GLUCAP in the last 168 hours. Lipid Profile: No results for input(s): CHOL, HDL, LDLCALC, TRIG, CHOLHDL, LDLDIRECT in the last 72  hours. Thyroid Function Tests: Recent Labs    08/23/21 1820  TSH 0.079*  FREET4 1.10   Anemia Panel: No results for input(s): VITAMINB12, FOLATE, FERRITIN, TIBC, IRON, RETICCTPCT in the last 72 hours. Urine analysis:    Component Value Date/Time   COLORURINE YELLOW 06/01/2011 0513   APPEARANCEUR CLEAR 06/01/2011 0513   LABSPEC 1.011 06/01/2011 0513   PHURINE 7.5 06/01/2011 0513   GLUCOSEU NEGATIVE 06/01/2011 0513   HGBUR TRACE (A) 06/01/2011 0513   BILIRUBINUR NEGATIVE 06/01/2011 0513   KETONESUR NEGATIVE 06/01/2011 0513   PROTEINUR NEGATIVE 06/01/2011 0513   UROBILINOGEN 1.0 06/01/2011 0513   NITRITE NEGATIVE 06/01/2011 0513   LEUKOCYTESUR NEGATIVE 06/01/2011 0513   Sepsis Labs: @LABRCNTIP (procalcitonin:4,lacticidven:4)  ) Recent Results (from the past 240 hour(s))  Resp Panel by RT-PCR (Flu A&B, Covid) Nasopharyngeal Swab     Status: None   Collection Time: 08/22/21  5:22 PM   Specimen: Nasopharyngeal Swab; Nasopharyngeal(NP) swabs in vial transport medium  Result Value Ref Range Status   SARS Coronavirus 2 by RT PCR NEGATIVE NEGATIVE Final    Comment: (NOTE) SARS-CoV-2 target nucleic acids are NOT DETECTED.  The SARS-CoV-2 RNA is generally detectable in upper respiratory specimens during the acute phase of infection. The lowest concentration of SARS-CoV-2 viral copies this assay can detect is 138 copies/mL. A negative result does not preclude SARS-Cov-2 infection and should not be used as the sole basis for treatment or other patient management decisions. A negative result may occur with  improper specimen collection/handling, submission of specimen other than nasopharyngeal swab, presence of viral mutation(s) within the areas targeted by this assay, and inadequate number of viral copies(<138 copies/mL). A negative result must be combined with clinical observations, patient history, and epidemiological information. The expected result is Negative.  Fact Sheet  for Patients:  EntrepreneurPulse.com.au  Fact Sheet for Healthcare Providers:  IncredibleEmployment.be  This test is no t yet approved or cleared by the [place] FDA and  has been authorized for detection and/or diagnosis of SARS-CoV-2 by FDA under an Emergency Use Authorization (EUA). This EUA will remain  in effect (meaning this test can be used) for the duration of the COVID-19 declaration under Section 564(b)(1) of the Act, 21 U.S.C.section 360bbb-3(b)(1), unless the authorization is terminated  or revoked sooner.       Influenza A by PCR NEGATIVE NEGATIVE Final   Influenza B by PCR NEGATIVE NEGATIVE Final    Comment: (NOTE) The Xpert Xpress SARS-CoV-2/FLU/RSV plus assay is intended as an aid in the diagnosis of influenza from Nasopharyngeal swab specimens and should not be used as a sole basis for treatment. Nasal washings and aspirates are unacceptable for Xpert Xpress SARS-CoV-2/FLU/RSV testing.  Fact Sheet for Patients: EntrepreneurPulse.com.au  Fact Sheet for Healthcare Providers: IncredibleEmployment.be  This test is not yet approved or cleared by the [place] FDA and has been authorized for detection and/or diagnosis of SARS-CoV-2 by FDA under an Emergency Use Authorization (EUA). This EUA will remain in effect (meaning this  test can be used) for the duration of the COVID-19 declaration under Section 564(b)(1) of the Act, 21 U.S.C. section 360bbb-3(b)(1), unless the authorization is terminated or revoked.  Performed at [facility], [address], [place], [place] [address]   Culture, blood (routine x 2)     Status: None (Preliminary result)   Collection Time: 08/22/21  5:37 PM   Specimen: BLOOD RIGHT HAND  Result Value Ref Range Status   Specimen Description BLOOD RIGHT HAND  Final   Special Requests   Final    BOTTLES DRAWN AEROBIC AND ANAEROBIC Blood Culture results may  not be optimal due to an inadequate volume of blood received in culture bottles   Culture   Final    NO GROWTH 3 DAYS Performed at [facility], [address] [place], [place] [address]    Report Status PENDING  Incomplete  Culture, blood (routine x 2)     Status: None (Preliminary result)   Collection Time: 08/23/21  8:20 AM   Specimen: BLOOD  Result Value Ref Range Status   Specimen Description BLOOD LEFT ANTECUBITAL  Final   Special Requests   Final    BOTTLES DRAWN AEROBIC AND ANAEROBIC Blood Culture adequate volume   Culture   Final    NO GROWTH 2 DAYS Performed at [facility], [address], [place], [place] [address]    Report Status PENDING  Incomplete         Radiology Studies: No results found.  Scheduled Meds:  apixaban  5 mg Oral BID   atorvastatin  40 mg Oral Daily   budesonide (PULMICORT) nebulizer solution  2 mg Nebulization Q12H   dextromethorphan-guaiFENesin  1 tablet Oral BID   diltiazem  180 mg Oral Daily   famotidine  20 mg Oral BID   nicotine  7 mg Transdermal Daily   predniSONE  40 mg Oral Q breakfast   Continuous Infusions:     LOS: 2 days    Time spent:    Zannie Cove, MD [facility]   08/25/2021, 1:46 PM

## 2021-08-25 NOTE — H&P (View-Only) (Signed)
Subjective:  Patient seen and examined at approximately 8:15 AM at bedside. Patient's daughter is present at bedside. Patient is feeling well overall without chest pain, dyspnea, dizziness, syncope, near syncope. Denies evidence of bleeding.  Intake/Output from previous day:  I/O last 3 completed shifts: In: 158.2 [I.V.:158.2] Out: -  No intake/output data recorded.  Blood pressure 117/63, pulse 99, temperature 97.8 F (36.6 C), temperature source Oral, resp. rate 15, height 5' 4" (1.626 m), weight 88.5 kg, SpO2 96 %. Physical Exam Vitals reviewed.  HENT:     Head: Normocephalic and atraumatic.  Cardiovascular:     Rate and Rhythm: Normal rate. Rhythm irregularly irregular.     Pulses: Intact distal pulses.     Heart sounds: S1 normal and S2 normal. No murmur heard.   No gallop.     Comments: Variable S1-S2 Pulmonary:     Effort: Pulmonary effort is normal. No respiratory distress.     Breath sounds: No wheezing, rhonchi or rales.  Abdominal:     General: Bowel sounds are normal. There is no distension.     Palpations: Abdomen is soft.  Musculoskeletal:     Right lower leg: No edema.     Left lower leg: No edema.  Skin:    General: Skin is warm and dry.  Neurological:     Mental Status: She is alert.   Lab Results: BMP BNP (last 3 results) Recent Labs    08/22/21 1635  BNP 112.3*    ProBNP (last 3 results) No results for input(s): PROBNP in the last 8760 hours. BMP Latest Ref Rng & Units 08/25/2021 08/24/2021 08/22/2021  Glucose 70 - 99 mg/dL 335(H) 226(H) 151(H)  BUN 8 - 23 mg/dL 27(H) 20 8  Creatinine 0.44 - 1.00 mg/dL 1.04(H) 0.96 0.84  Sodium 135 - 145 mmol/L 132(L) 138 135  Potassium 3.5 - 5.1 mmol/L 4.1 4.2 3.5  Chloride 98 - 111 mmol/L 97(L) 99 100  CO2 22 - 32 mmol/L 26 31 27  Calcium 8.9 - 10.3 mg/dL 9.3 10.0 9.5   Hepatic Function Latest Ref Rng & Units 08/22/2021 09/01/2012 08/10/2007  Total Protein 6.5 - 8.1 g/dL 6.8 6.8 6.6  Albumin 3.5 - 5.0  g/dL 3.7 3.7 3.7  AST 15 - 41 U/L 16 12 16  ALT 0 - 44 U/L 16 8 14  Alk Phosphatase 38 - 126 U/L 89 70 88  Total Bilirubin 0.3 - 1.2 mg/dL 0.6 0.3 0.7   CBC Latest Ref Rng & Units 08/25/2021 08/22/2021 03/21/2021  WBC 4.0 - 10.5 K/uL 22.5(H) 9.6 12.1(H)  Hemoglobin 12.0 - 15.0 g/dL 12.5 12.5 12.5  Hematocrit 36.0 - 46.0 % 38.4 40.1 40.1  Platelets 150 - 400 K/uL 244 238 252   Lipid Panel  No results found for: CHOL, TRIG, HDL, CHOLHDL, VLDL, LDLCALC, LDLDIRECT Cardiac Panel (last 3 results) No results for input(s): CKTOTAL, CKMB, TROPONINI, RELINDX in the last 72 hours.  HEMOGLOBIN A1C Lab Results  Component Value Date   HGBA1C 7.3 (H) 08/23/2021   MPG 163 08/23/2021   TSH Recent Labs    08/23/21 1820  TSH 0.079*   Imaging: No results found.  Cardiac Studies:  Echocardiogram: 08/23/2021: LVEF 55-60%, normal wall motion, right ventricular size and function normal, trivial MR, global longitudinal strain -20.6%   Stress Testing:  None   Heart Catheterization: None  EKG:  08/22/2021: Sinus tach, 106 bpm, normal axis, subtle ST-T changes in the anterolateral leads, without underlying injury pattern.   08/23/2021 1333:   A. fib RVR, 123 bpm, diffuse nonspecific ST-T changes likely secondary to rate related but ischemia cannot be ruled out.  08/25/2021: Atrial fibrillation, 108 bpm.  Diffuse nonspecific ST-T changes unchanged compared to previous.  Telemetry:  Atrial fibrillation relatively well rate controlled approximately 90-100 bpm, max 130 bpm   Scheduled Meds:  apixaban  5 mg Oral BID   atorvastatin  40 mg Oral Daily   budesonide (PULMICORT) nebulizer solution  2 mg Nebulization Q12H   dextromethorphan-guaiFENesin  1 tablet Oral BID   diltiazem  60 mg Oral Q6H   famotidine  20 mg Oral BID   nicotine  7 mg Transdermal Daily   predniSONE  40 mg Oral Q breakfast   Continuous Infusions:  diltiazem (CARDIZEM) infusion 5 mg/hr (08/24/21 1712)   PRN  Meds:.acetaminophen, albuterol  Assessment/Plan:  [name] is a 70 y.o. African-American female whose past medical history and cardiovascular risk factors include: COPD, tobacco use, diet-controlled type II diabetes, hypertension, hyperlipidemia, rheumatoid arthritis, GERD .   Impression:  Atrial fibrillation with rapid ventricular rate: Ventricular rate better controlled-but RVR with minimal activity Rate control: oral Cardizem Rhythm control: N/A Thromboembolic prophylaxis: Eliquis Newly discovered in the setting of acute COPD exacerbation, hypoxia, thyroid dysfunction. Recommend transitioning her from oral Cardizem 60 mg every 6 hours to long-acting oral Cardizem 180 mg once daily with holding parameters. Again discussed at length with patient and her daughter who is present at bedside regarding TEE guided cardioversion versus rate control strategy and outpatient follow-up. Shared decision was to proceed with TEE guided cardioversion tomorrow morning. Again review risks and benefits, patient verbalized understanding and wishes to proceed.    Long-term oral anticoagulation: Indication: A. fib (chronicity unknown) No prior history of intracranial or gastrointestinal bleeding. No prior falls. CHA2DS2-VASc SCORE is 4 which correlates to 4% risk of stroke per year (HTN, age, diabetes, gender) Patient and daughter are aware of risks, benefits, and alternatives to oral anticoagulation.  Patient is aware of recommendations regarding monitoring for evidence of bleeding and evaluation in the context of fall or injury. Anticoagulation started 08/23/2021.   Abnormal EKG: Suspect nonspecific ST-T wave changes to be related to rapid ventricular late, however ischemia cannot be excluded.  Given no chest pain and negative troponins recommend outpatient follow-up with stress test.   Diet-controlled diabetes: Will defer management to primary team.   Hyperlipidemia: Continue statin therapy.    Hypertension: When appropriate consider addition of ARB given her diabetes.   Smoking: Reiterated the importance of smoking cessation, particularly given COPD and hypoxia.    Thyroid dysfunction: We will defer management to primary team.   Patient was seen in collaboration with Dr. Alexzia Kasler. He also reviewed patient's chart and examined the patient. Dr. Senaya Dicenso is in agreement of the plan.    Celeste C Cantwell, PA-C 08/25/2021, 8:27 AM Office: [phone]  ADDENDUM: Patient seen and examined independently. Note reviewed with the Celeste Cantwell, PA and relevant changes were made to her note if needed.  I personally reviewed laboratory data, imaging studies and relevant notes.  I independently examined the patient and formulated the important aspects of the plan.  I have personally discussed the plan with the patient and daughter.  Comments or changes to the note/plan are indicated below.  My Exam:  Vitals with BMI 08/25/2021 08/25/2021 08/25/2021  Height - - -  Weight - - -  BMI - - -  Systolic 127 131 117  Diastolic 98 77 63  Pulse 96 95 99      CONSTITUTIONAL: Well-developed and well-nourished. No acute distress.  SKIN: Skin is warm and dry. No rash noted. No cyanosis. No pallor. No jaundice HEAD: Normocephalic and atraumatic.  EYES: No scleral icterus MOUTH/THROAT: Moist oral membranes.  NECK: No JVD present. No thyromegaly noted. No carotid bruits  LYMPHATIC: No visible cervical adenopathy.  CHEST Normal respiratory effort. No intercostal retractions  LUNGS: Clear to auscultation bilaterally.  No stridor. No wheezes. No rales.  CARDIOVASCULAR: Irregularly irregular, variable S1-S2 no murmurs rubs or gallops appreciated ABDOMINAL: Obese, soft, nontender, nondistended, positive bowel sounds in all 4 quadrants no apparent ascites.  EXTREMITIES: No peripheral edema  HEMATOLOGIC: No significant bruising NEUROLOGIC: Oriented to person, place, and time. Nonfocal. Normal muscle  tone.  PSYCHIATRIC: Normal mood and affect. Normal behavior. Cooperative   Assessment & Plan:  Symptomatic atrial fibrillation Newly discovered  Has transitioned off of Cardizem drip to oral Cardizem and the rates have improved while at rest.  However with minimal exertion ventricular rate still greater than 120 bpm.  I spoke to the patient and daughter during morning rounds as well as in the evening and the shared decision was to proceed with TEE guided direct-current cardioversion.  After careful review of history and examination, the risks, benefits of transesophageal echocardiogram, and alternatives have been explained to the patient and daughter Katrina. Complications include but not limited to esophageal perforation (rare), gastrointestinal bleeding (rare), cardiac arrhythmia which can include cardiac arrest and death (rare), pharyngeal irritation / discomfort with swallowing / hematoma, methemoglobinemia, bronchospasm, transient hypoxia, nonsustained ventricular tachycardia, transient atrial fibrillation, minimal hemoptysis, vomiting, hypotension, respiratory compromise, reaction to medications, unavoidable damage to teeth and gums, aspiration pneumonia  were reviewed with the patient.  Patient and daughter voices understands, provides verbal feedback, questions answered, and patient/daughter Katrina wishes to proceed with the procedure.  Risks, benefits, and alternatives of direct current cardioversion reviewed with the and patient and daughter Katrina. Risk includes but not limited to: potential for post-cardioversion rhythms, life-threatening arrhythmias (ventricular tachycardia and fibrillation, profound bradycardia, cardiac arrest), myocardial damage, acute pulmonary edema, skin burns, transient hypotension. Benefits include restoration of sinus rhythm. Alternatives to treatment were discussed, questions were answered, patient voices understanding and provides verbal feedback.  Patient is  willing to proceed.   Time Spent with Patient: I have spent a total of 37 minutes with patient reviewing hospital notes, telemetry, EKGs, labs and examining the patient as well as establishing an assessment and plan that was discussed with the patient.  > 50% of time was spent in direct patient care.  This note was created using a voice recognition software as a result there may be grammatical errors inadvertently enclosed that do not reflect the nature of this encounter. Every attempt is made to correct such errors.   Jarrick Fjeld, DO, FACC  Pager: [phone] Office: [phone] 08/25/2021 6:14 PM   

## 2021-08-26 ENCOUNTER — Inpatient Hospital Stay (HOSPITAL_COMMUNITY): Payer: Medicare Other | Admitting: Certified Registered Nurse Anesthetist

## 2021-08-26 ENCOUNTER — Encounter (HOSPITAL_COMMUNITY)
Admission: EM | Disposition: A | Discharge status: Home / Self Care | Payer: Self-pay | Source: Home / Self Care | Attending: Internal Medicine

## 2021-08-26 ENCOUNTER — Encounter (HOSPITAL_COMMUNITY): Payer: Self-pay | Admitting: Internal Medicine

## 2021-08-26 ENCOUNTER — Inpatient Hospital Stay (HOSPITAL_COMMUNITY): Payer: Medicare Other

## 2021-08-26 DIAGNOSIS — Z7901 Long term (current) use of anticoagulants: Secondary | ICD-10-CM | POA: Diagnosis not present

## 2021-08-26 DIAGNOSIS — J9601 Acute respiratory failure with hypoxia: Secondary | ICD-10-CM | POA: Diagnosis not present

## 2021-08-26 DIAGNOSIS — I4891 Unspecified atrial fibrillation: Secondary | ICD-10-CM

## 2021-08-26 DIAGNOSIS — I10 Essential (primary) hypertension: Secondary | ICD-10-CM

## 2021-08-26 DIAGNOSIS — J441 Chronic obstructive pulmonary disease with (acute) exacerbation: Secondary | ICD-10-CM | POA: Diagnosis not present

## 2021-08-26 HISTORY — PX: TEE WITHOUT CARDIOVERSION: SHX5443

## 2021-08-26 HISTORY — PX: BUBBLE STUDY: SHX6837

## 2021-08-26 HISTORY — PX: CARDIOVERSION: SHX1299

## 2021-08-26 LAB — BASIC METABOLIC PANEL
Anion gap: 7 (ref 5–15)
BUN: 22 mg/dL (ref 8–23)
CO2: 31 mmol/L (ref 22–32)
Calcium: 9.3 mg/dL (ref 8.9–10.3)
Chloride: 99 mmol/L (ref 98–111)
Creatinine, Ser: 0.93 mg/dL (ref 0.44–1.00)
GFR, Estimated: >60 mL/min (ref >60)
Glucose, Bld: 206 mg/dL — ABNORMAL HIGH (ref 70–99)
Potassium: 4 mmol/L (ref 3.5–5.1)
Sodium: 137 mmol/L (ref 135–145)

## 2021-08-26 LAB — CBC
HCT: 39 % (ref 36.0–46.0)
Hemoglobin: 12.6 g/dL (ref 12.0–15.0)
MCH: 26.9 pg (ref 26.0–34.0)
MCHC: 32.3 g/dL (ref 30.0–36.0)
MCV: 83.2 fL (ref 80.0–100.0)
Platelets: 263 10*3/uL (ref 150–400)
RBC: 4.69 MIL/uL (ref 3.87–5.11)
RDW: 15.3 % (ref 11.5–15.5)
WBC: 20.6 10*3/uL — ABNORMAL HIGH (ref 4.0–10.5)
nRBC: 0.1 % (ref 0.0–0.2)

## 2021-08-26 LAB — GLUCOSE, CAPILLARY
Glucose-Capillary: 155 mg/dL — ABNORMAL HIGH (ref 70–99)
Glucose-Capillary: 159 mg/dL — ABNORMAL HIGH (ref 70–99)
Glucose-Capillary: 104 mg/dL — ABNORMAL HIGH (ref 70–99)
Glucose-Capillary: 107 mg/dL — ABNORMAL HIGH (ref 70–99)

## 2021-08-26 LAB — PROTIME-INR
INR: 1.2 (ref 0.8–1.2)
Prothrombin Time: 14.8 seconds (ref 11.4–15.2)

## 2021-08-26 SURGERY — ECHOCARDIOGRAM, TRANSESOPHAGEAL
Anesthesia: Monitor Anesthesia Care

## 2021-08-26 MED ORDER — GUAIFENESIN ER 600 MG PO TB12
600.0000 mg | ORAL_TABLET | Freq: Two times a day (BID) | ORAL | Status: DC
  Administered 2021-08-26 – 2021-08-27 (×2): 600 mg via ORAL
  Filled 2021-08-26 (×2): qty 1

## 2021-08-26 MED ORDER — DILTIAZEM HCL ER COATED BEADS 120 MG PO CP24
120.0000 mg | ORAL_CAPSULE | Freq: Every day | ORAL | Status: DC
  Administered 2021-08-26: 120 mg via ORAL
  Filled 2021-08-26: qty 1

## 2021-08-26 MED ORDER — IPRATROPIUM-ALBUTEROL 0.5-2.5 (3) MG/3ML IN SOLN: 3.0000 mL | Freq: Four times a day (QID) | RESPIRATORY_TRACT | Status: DC

## 2021-08-26 MED ORDER — SODIUM CHLORIDE 0.9 % IV SOLN: INTRAVENOUS | Status: DC

## 2021-08-26 MED ORDER — IPRATROPIUM-ALBUTEROL 0.5-2.5 (3) MG/3ML IN SOLN
3.0000 mL | Freq: Four times a day (QID) | RESPIRATORY_TRACT | Status: DC
  Administered 2021-08-26 – 2021-08-27 (×4): 3 mL via RESPIRATORY_TRACT
  Filled 2021-08-26 (×4): qty 3

## 2021-08-26 MED ORDER — LIDOCAINE 2% (20 MG/ML) 5 ML SYRINGE
INTRAMUSCULAR | Status: DC | PRN
  Administered 2021-08-26: 100 mg via INTRAVENOUS

## 2021-08-26 MED ORDER — PROPOFOL 10 MG/ML IV BOLUS
INTRAVENOUS | Status: DC | PRN
  Administered 2021-08-26: 40 mg via INTRAVENOUS
  Administered 2021-08-26: 60 mg via INTRAVENOUS

## 2021-08-26 MED ORDER — BUTAMBEN-TETRACAINE-BENZOCAINE 2-2-14 % EX AERO
INHALATION_SPRAY | CUTANEOUS | Status: DC | PRN
  Administered 2021-08-26: 2 via TOPICAL

## 2021-08-26 MED ORDER — PHENOL 1.4 % MT LIQD
1.0000 | OROMUCOSAL | Status: DC | PRN
  Administered 2021-08-26: 1 via OROMUCOSAL
  Filled 2021-08-26: qty 177

## 2021-08-26 MED ORDER — PROPOFOL 500 MG/50ML IV EMUL
INTRAVENOUS | Status: DC | PRN
  Administered 2021-08-26: 75 ug/kg/min via INTRAVENOUS

## 2021-08-26 NOTE — Progress Notes (Signed)
  Echocardiogram Echocardiogram Transesophageal has been performed.  Janalyn Harder 08/26/2021, 8:34 AM

## 2021-08-26 NOTE — CV Procedure (Signed)
Transesophageal echocardiogram (TEE) : Preliminary report 08/26/21  Sedation: See anesthesia records.   TEE was performed without complications   LV: Mildly reduced LVEF 45-50%. RV: Grossly normal structure and function.   LA: Dilated. Spontaneous echo contrast was present.  No thrombus present. Left atrial appendage: Spontaneous echo contrast was present.  No thrombus present. Inter atrial septum is intact without defect. Double contrast study negative for atrial level shunting. RA: Grossly normal.   MV: Mild Regurgitation, no stenosis, no vegetation noted.  TV: Trivial Regurgitation, no stenosis, no vegetation noted. AV: No regurgitation, no stenosis, no vegetation noted.   PV: No regurgitation, no stenosis, no vegetation noted.   Thoracic and ascending aorta: Mild plaque.  Final report forthcoming.  [name] [facility]  Pager: [phone] Office: [phone]  Direct current cardioversion: Indications:  Atrial Fibrillation  Procedure Details:  Consent: Risks of procedure as well as the alternatives and risks of each were explained to the (patient/caregiver).  Consent for procedure obtained.  Time Out: Verified patient identification, verified procedure, site/side was marked, verified correct patient position, special equipment/implants available, medications/allergies/relevent history reviewed, required imaging and test results available. PERFORMED.  Patient placed on cardiac monitor, pulse oximetry, supplemental oxygen as necessary.  Sedation given:  See anesthesia records.  Pacer pads placed anterior and posterior chest.  Cardioverted 1 time(s).  Cardioversion with synchronized biphasic 200J shock.  Evaluation: Findings: Post procedure EKG shows: NSR Complications: None Patient did tolerate procedure well.  Daughter update after her TEE / Cardioversion.   [name] 08/26/2021, 8:22 AM

## 2021-08-26 NOTE — Transfer of Care (Signed)
Immediate Anesthesia Transfer of Care Note  Patient: [name]  Procedure(s) Performed: TRANSESOPHAGEAL ECHOCARDIOGRAM (TEE) CARDIOVERSION BUBBLE STUDY  Patient Location: PACU and Endoscopy Unit  Anesthesia Type:MAC  Level of Consciousness: drowsy and patient cooperative  Airway & Oxygen Therapy: Patient Spontanous Breathing and Patient connected to nasal cannula oxygen  Post-op Assessment: Report given to RN and Post -op Vital signs reviewed and stable  Post vital signs: Reviewed and stable  Last Vitals:  Vitals Value Taken Time  BP 106/78 0829  Temp    Pulse 68 0829  Resp 14 0829  SpO2 96 0829    Last Pain:  Vitals:   08/26/21 0719  TempSrc: Temporal  PainSc: 0-No pain      Patients Stated Pain Goal: 3 (51/88/41 6606)  Complications: No notable events documented.

## 2021-08-26 NOTE — Anesthesia Preprocedure Evaluation (Signed)
Anesthesia Evaluation  Patient identified by MRN, date of birth, ID band Patient awake    Reviewed: Allergy & Precautions, H&P , NPO status , Patient's Chart, lab work & pertinent test results  Airway Mallampati: II   Neck ROM: full    Dental   Pulmonary asthma , COPD, Current Smoker,    breath sounds clear to auscultation       Cardiovascular hypertension, + dysrhythmias Atrial Fibrillation  Rhythm:irregular Rate:Normal     Neuro/Psych    GI/Hepatic GERD  ,  Endo/Other  diabetes, Type 2  Renal/GU      Musculoskeletal  (+) Arthritis ,   Abdominal   Peds  Hematology   Anesthesia Other Findings   Reproductive/Obstetrics                             Anesthesia Physical Anesthesia Plan  ASA: 3  Anesthesia Plan: MAC   Post-op Pain Management:    Induction: Intravenous  PONV Risk Score and Plan: 1 and Propofol infusion and Treatment may vary due to age or medical condition  Airway Management Planned: Nasal Cannula  Additional Equipment:   Intra-op Plan:   Post-operative Plan:   Informed Consent: I have reviewed the patients History and Physical, chart, labs and discussed the procedure including the risks, benefits and alternatives for the proposed anesthesia with the patient or authorized representative who has indicated his/her understanding and acceptance.     Dental advisory given  Plan Discussed with: CRNA, Anesthesiologist and Surgeon  Anesthesia Plan Comments:         Anesthesia Quick Evaluation

## 2021-08-26 NOTE — Progress Notes (Signed)
[facility]  PROGRESS NOTE  DAWSYN ZURN KKX:[id number] DOB: 11-27-1950 DOA: 08/22/2021 PCP: Stann Ore, NP   Brief HPI:   70 year old female with a history of COPD, ongoing tobacco abuse, diabetes mellitus type 2, hypertension, dyslipidemia, RA, GERD came to ED with cough, shortness of breath, URI and wheezing.  She was hypoxemic in the ED.  Chest x-ray was clear.  She was found to be in COPD exacerbation.  Hospital course was complicated by new onset A. fib, RVR started on Cardizem, Eliquis, cardiology was consulted.  Patient underwent TEE/DCCV with successful conversion to    Subjective   Still has wheezing, no longer requiring oxygen.   Assessment/Plan:     Acute hypoxemic respiratory failure -Secondary to COPD exacerbation, chest x-ray unremarkable -Still has wheezing bilaterally, no longer requiring oxygen -Continue with DuoNeb nebulizers every 6 hours, Pulmicort 0.5 mg nebulization every 12 hours, -Continue Mucinex, prednisone 30 mg daily  Diabetes mellitus type 2 -Hemoglobin A1c is 7.3 -Continue Lantus 10 units subcu daily -Patient was not on medications at home, will discharge on metformin -CBG well controlled  New onset atrial fibrillation with RVR -Underwent successful TEE/DCCV -Patient was started on Cardizem gtt., transitioned to p.o. Cardizem -CHA2DS2VASc score is 4, started on Eliquis -TSH 0.079, free T4 is normal, likely sick euthyroid state will need TSH/T4 repeated in 6 weeks  Hypertension -Continue amlodipine  GERD -Continue Pepcid   Medications     apixaban  5 mg Oral BID   atorvastatin  40 mg Oral Daily   budesonide (PULMICORT) nebulizer solution  0.5 mg Nebulization Q12H   dextromethorphan-guaiFENesin  1 tablet Oral BID   diltiazem  120 mg Oral Daily   famotidine  20 mg Oral BID   insulin aspart  0-15 Units Subcutaneous TID WC   insulin glargine-yfgn  10 Units Subcutaneous Daily   ipratropium-albuterol  3 mL Nebulization Q6H    nicotine  7 mg Transdermal Daily   polyethylene glycol  17 g Oral Daily   predniSONE  30 mg Oral Q breakfast     Data Reviewed:   CBG:  Recent Labs  Lab 08/25/21 1645 08/25/21 2045 08/26/21 0907 08/26/21 1220 08/26/21 1631  GLUCAP 375* 215* 155* 159* 104*    SpO2: 96 % O2 Flow Rate (L/min): 2 L/min    Vitals:   08/26/21 0835 08/26/21 0845 08/26/21 1217 08/26/21 1630  BP: 111/81 127/87 132/86 (!) 158/84  Pulse: 63 79 (!) 47 69  Resp: 12 15 17 13   Temp:   98.3 F (36.8 C) (!) 97.4 F (36.3 C)  TempSrc:   Oral Oral  SpO2: 97% 92% 94% 96%  Weight:      Height:         Intake/Output Summary (Last 24 hours) at 08/26/2021 1731 Last data filed at 08/26/2021 1300 Gross per 24 hour  Intake 581.6 ml  Output --  Net 581.6 ml    11/29 1901 - 12/01 0700 In: 721.6 [P.O.:720; I.V.:1.6] Out: -   Filed Weights   08/22/21 1741 08/26/21 0719  Weight: 88.5 kg 88.5 kg    Data Reviewed: Basic Metabolic Panel: Recent Labs  Lab 08/22/21 1635 08/24/21 0312 08/25/21 0204 08/26/21 0228  NA 135 138 132* 137  K 3.5 4.2 4.1 4.0  CL 100 99 97* 99  CO2 27 31 26 31   GLUCOSE 151* 226* 335* 206*  BUN 8 20 27* 22  CREATININE 0.84 0.96 1.04* 0.93  CALCIUM 9.5 10.0 9.3 9.3  MG  --  2.1  --   --    Liver Function Tests: Recent Labs  Lab 08/22/21 1635  AST 16  ALT 16  ALKPHOS 89  BILITOT 0.6  PROT 6.8  ALBUMIN 3.7   No results for input(s): LIPASE, AMYLASE in the last 168 hours. No results for input(s): AMMONIA in the last 168 hours. CBC: Recent Labs  Lab 08/22/21 1635 08/25/21 0204 08/26/21 0228  WBC 9.6 22.5* 20.6*  NEUTROABS 8.7*  --   --   HGB 12.5 12.5 12.6  HCT 40.1 38.4 39.0  MCV 84.4 83.1 83.2  PLT 238 244 263   Cardiac Enzymes: No results for input(s): CKTOTAL, CKMB, CKMBINDEX, TROPONINI in the last 168 hours. BNP (last 3 results) Recent Labs    08/22/21 1635  BNP 112.3*    ProBNP (last 3 results) No results for input(s): PROBNP in the  last 8760 hours.  CBG: Recent Labs  Lab 08/25/21 1645 08/25/21 2045 08/26/21 0907 08/26/21 1220 08/26/21 1631  GLUCAP 375* 215* 155* 159* 104*       Radiology Reports  No results found.     Antibiotics: Anti-infectives (From admission, onward)    Start     Dose/Rate Route Frequency Ordered Stop   08/23/21 0115  doxycycline (VIBRA-TABS) tablet 100 mg  Status:  Discontinued        100 mg Oral Every 12 hours 08/23/21 0106 08/23/21 0125         DVT prophylaxis: Apixaban  Code Status: Full code  Family Communication: No family at bedside   Consultants: Cardiology  Procedures: TEE/DCCV    Objective    Physical Examination:   General-appears in no acute distress Heart-S1-S2, regular, no murmur auscultated Lungs-bilateral wheezing auscultated Abdomen-soft, nontender, no organomegaly Extremities-no edema in the lower extremities Neuro-alert, oriented x3, no focal deficit noted  Status is: Inpatient  Dispo: The patient is from: Home              Anticipated d/c is to: Home              Anticipated d/c date is: Tomorrow              Patient currently not stable for discharge  Barrier to discharge-ongoing wheezing  COVID-19 Labs  No results for input(s): DDIMER, FERRITIN, LDH, CRP in the last 72 hours.  Lab Results  Component Value Date   SARSCOV2NAA NEGATIVE 08/22/2021   SARSCOV2NAA NEGATIVE 03/20/2021   [place] NEGATIVE 10/04/2020   [place] Not Detected 06/11/2019            Recent Results (from the past 240 hour(s))  Resp Panel by RT-PCR (Flu A&B, Covid) Nasopharyngeal Swab     Status: None   Collection Time: 08/22/21  5:22 PM   Specimen: Nasopharyngeal Swab; Nasopharyngeal(NP) swabs in vial transport medium  Result Value Ref Range Status   SARS Coronavirus 2 by RT PCR NEGATIVE NEGATIVE Final    Comment: (NOTE) SARS-CoV-2 target nucleic acids are NOT DETECTED.  The SARS-CoV-2 RNA is generally detectable in upper  respiratory specimens during the acute phase of infection. The lowest concentration of SARS-CoV-2 viral copies this assay can detect is 138 copies/mL. A negative result does not preclude SARS-Cov-2 infection and should not be used as the sole basis for treatment or other patient management decisions. A negative result may occur with  improper specimen collection/handling, submission of specimen other than nasopharyngeal swab, presence of viral mutation(s) within the areas targeted by this assay, and inadequate number of viral copies(<138  copies/mL). A negative result must be combined with clinical observations, patient history, and epidemiological information. The expected result is Negative.  Fact Sheet for Patients:  EntrepreneurPulse.com.au  Fact Sheet for Healthcare Providers:  IncredibleEmployment.be  This test is no t yet approved or cleared by the [place] FDA and  has been authorized for detection and/or diagnosis of SARS-CoV-2 by FDA under an Emergency Use Authorization (EUA). This EUA will remain  in effect (meaning this test can be used) for the duration of the COVID-19 declaration under Section 564(b)(1) of the Act, 21 U.S.C.section 360bbb-3(b)(1), unless the authorization is terminated  or revoked sooner.       Influenza A by PCR NEGATIVE NEGATIVE Final   Influenza B by PCR NEGATIVE NEGATIVE Final    Comment: (NOTE) The Xpert Xpress SARS-CoV-2/FLU/RSV plus assay is intended as an aid in the diagnosis of influenza from Nasopharyngeal swab specimens and should not be used as a sole basis for treatment. Nasal washings and aspirates are unacceptable for Xpert Xpress SARS-CoV-2/FLU/RSV testing.  Fact Sheet for Patients: EntrepreneurPulse.com.au  Fact Sheet for Healthcare Providers: IncredibleEmployment.be  This test is not yet approved or cleared by the [place] FDA and has been  authorized for detection and/or diagnosis of SARS-CoV-2 by FDA under an Emergency Use Authorization (EUA). This EUA will remain in effect (meaning this test can be used) for the duration of the COVID-19 declaration under Section 564(b)(1) of the Act, 21 U.S.C. section 360bbb-3(b)(1), unless the authorization is terminated or revoked.  Performed at [facility], [place] [address], [place], [place] [address]   Culture, blood (routine x 2)     Status: None (Preliminary result)   Collection Time: 08/22/21  5:37 PM   Specimen: BLOOD RIGHT HAND  Result Value Ref Range Status   Specimen Description BLOOD RIGHT HAND  Final   Special Requests   Final    BOTTLES DRAWN AEROBIC AND ANAEROBIC Blood Culture results may not be optimal due to an inadequate volume of blood received in culture bottles   Culture   Final    NO GROWTH 4 DAYS Performed at [facility], [place] [address] [place], [place] [address]    Report Status PENDING  Incomplete  Culture, blood (routine x 2)     Status: None (Preliminary result)   Collection Time: 08/23/21  8:20 AM   Specimen: BLOOD  Result Value Ref Range Status   Specimen Description BLOOD LEFT ANTECUBITAL  Final   Special Requests   Final    BOTTLES DRAWN AEROBIC AND ANAEROBIC Blood Culture adequate volume   Culture   Final    NO GROWTH 3 DAYS Performed at [facility], [place] [address], [place], [place] [address]    Report Status PENDING  Incomplete    Oswald Hillock   [facility] If 7PM-7AM, please contact night-coverage at [email or web address], Office  [phone]   08/26/2021, 5:31 PM  LOS: 3 days

## 2021-08-26 NOTE — Interval H&P Note (Signed)
History and Physical Interval Note:  08/26/2021 7:32 AM  [name]  has presented today for surgery, with the diagnosis of new onset afib.  The various methods of treatment have been discussed with the patient and family. After consideration of risks, benefits and other options for treatment, the patient has consented to  Procedure(s): TRANSESOPHAGEAL ECHOCARDIOGRAM (TEE) (N/A) CARDIOVERSION (N/A) as a surgical intervention.  The patient's history has been reviewed, patient examined, no change in status, stable for surgery.  I have reviewed the patient's chart and labs.  Questions were answered to the patient's satisfaction.     Polette Nofsinger [employer]

## 2021-08-26 NOTE — Anesthesia Procedure Notes (Signed)
Procedure Name: MAC Date/Time: 08/26/2021 7:51 AM Performed by: Lowella Dell, CRNA Pre-anesthesia Checklist: Patient identified, Emergency Drugs available, Suction available, Patient being monitored and Timeout performed Patient Re-evaluated:Patient Re-evaluated prior to induction Oxygen Delivery Method: Nasal cannula Placement Confirmation: positive ETCO2 Dental Injury: Teeth and Oropharynx as per pre-operative assessment

## 2021-08-27 DIAGNOSIS — E079 Disorder of thyroid, unspecified: Secondary | ICD-10-CM

## 2021-08-27 DIAGNOSIS — Z9889 Other specified postprocedural states: Secondary | ICD-10-CM

## 2021-08-27 DIAGNOSIS — I480 Paroxysmal atrial fibrillation: Secondary | ICD-10-CM

## 2021-08-27 DIAGNOSIS — J9601 Acute respiratory failure with hypoxia: Secondary | ICD-10-CM | POA: Diagnosis not present

## 2021-08-27 DIAGNOSIS — J441 Chronic obstructive pulmonary disease with (acute) exacerbation: Secondary | ICD-10-CM | POA: Diagnosis not present

## 2021-08-27 DIAGNOSIS — F17200 Nicotine dependence, unspecified, uncomplicated: Secondary | ICD-10-CM

## 2021-08-27 DIAGNOSIS — I4891 Unspecified atrial fibrillation: Secondary | ICD-10-CM | POA: Diagnosis not present

## 2021-08-27 LAB — CBC
HCT: 42.2 % (ref 36.0–46.0)
Hemoglobin: 13.5 g/dL (ref 12.0–15.0)
MCH: 26.5 pg (ref 26.0–34.0)
MCHC: 32 g/dL (ref 30.0–36.0)
MCV: 82.9 fL (ref 80.0–100.0)
Platelets: 255 10*3/uL (ref 150–400)
RBC: 5.09 MIL/uL (ref 3.87–5.11)
RDW: 15.2 % (ref 11.5–15.5)
WBC: 15.4 10*3/uL — ABNORMAL HIGH (ref 4.0–10.5)
nRBC: 0.1 % (ref 0.0–0.2)

## 2021-08-27 LAB — CULTURE, BLOOD (ROUTINE X 2): Culture: NO GROWTH

## 2021-08-27 LAB — GLUCOSE, CAPILLARY
Glucose-Capillary: 148 mg/dL — ABNORMAL HIGH (ref 70–99)
Glucose-Capillary: 179 mg/dL — ABNORMAL HIGH (ref 70–99)

## 2021-08-27 MED ORDER — GLIMEPIRIDE 1 MG PO TABS: 1.0000 mg | ORAL_TABLET | Freq: Every day | ORAL | 11 refills | Status: DC

## 2021-08-27 MED ORDER — BLOOD GLUCOSE MONITOR KIT: PACK | 0 refills | Status: AC

## 2021-08-27 MED ORDER — PREDNISONE 10 MG PO TABS: ORAL_TABLET | ORAL | 0 refills | Status: DC

## 2021-08-27 MED ORDER — METFORMIN HCL 500 MG PO TABS: 500.0000 mg | ORAL_TABLET | Freq: Two times a day (BID) | ORAL | 11 refills | Status: AC

## 2021-08-27 MED ORDER — DILTIAZEM HCL ER COATED BEADS 180 MG PO CP24
180.0000 mg | ORAL_CAPSULE | Freq: Every day | ORAL | 3 refills | Status: DC
Start: 2021-08-28 — End: 2023-07-31

## 2021-08-27 MED ORDER — DILTIAZEM HCL ER COATED BEADS 180 MG PO CP24
180.0000 mg | ORAL_CAPSULE | Freq: Every day | ORAL | Status: DC
  Administered 2021-08-27: 180 mg via ORAL
  Filled 2021-08-27: qty 1

## 2021-08-27 MED ORDER — APIXABAN 5 MG PO TABS: 5.0000 mg | ORAL_TABLET | Freq: Two times a day (BID) | ORAL | 2 refills | Status: AC

## 2021-08-27 NOTE — Discharge Instructions (Signed)

## 2021-08-27 NOTE — Anesthesia Postprocedure Evaluation (Signed)
Anesthesia Post Note  Patient: [name]  Procedure(s) Performed: TRANSESOPHAGEAL ECHOCARDIOGRAM (TEE) CARDIOVERSION BUBBLE STUDY     Patient location during evaluation: Endoscopy Anesthesia Type: MAC Level of consciousness: awake and alert Pain management: pain level controlled Vital Signs Assessment: post-procedure vital signs reviewed and stable Respiratory status: spontaneous breathing, nonlabored ventilation, respiratory function stable and patient connected to nasal cannula oxygen Cardiovascular status: stable and blood pressure returned to baseline Postop Assessment: no apparent nausea or vomiting Anesthetic complications: no   No notable events documented.  Last Vitals:  Vitals:   08/27/21 1212 08/27/21 1432  BP: 119/77   Pulse: 69   Resp: 15   Temp: 36.8 C   SpO2:  98%    Last Pain:  Vitals:   08/27/21 1212  TempSrc: Oral  PainSc:    Pain Goal: Patients Stated Pain Goal: 3 (08/22/21 1622)                 Virgin Zellers S

## 2021-08-27 NOTE — Plan of Care (Signed)
?  Problem: Health Behavior/Discharge Planning: ?Goal: Ability to manage health-related needs will improve ?Outcome: Adequate for Discharge ?  ?Problem: Clinical Measurements: ?Goal: Ability to maintain clinical measurements within normal limits will improve ?Outcome: Adequate for Discharge ?Goal: Will remain free from infection ?Outcome: Adequate for Discharge ?Goal: Diagnostic test results will improve ?Outcome: Adequate for Discharge ?Goal: Respiratory complications will improve ?Outcome: Adequate for Discharge ?Goal: Cardiovascular complication will be avoided ?Outcome: Adequate for Discharge ?  ?

## 2021-08-27 NOTE — Discharge Summary (Signed)
Physician Discharge Summary  [name] WVP:[id number] DOB: Apr 19, 1951 DOA: 08/22/2021  PCP: Marzetta Board, NP  Admit date: 08/22/2021 Discharge date: 08/27/2021  Time spent: 60 minutes  Recommendations for Outpatient Follow-up:  Follow-up PCP in 1 week Follow-up cardiology as outpatient  Discharge Diagnoses:  Principal Problem:   COPD exacerbation ([place]) Active Problems:   Hypertension   Hyperlipidemia   Acute hypoxemic respiratory failure ([facility])   Abnormal EKG   Atrial fibrillation with RVR ([place])   Current use of long term anticoagulation   Hypoxemia   Paroxysmal A-fib ([facility])   History of cardioversion   Smoking   Thyroid dysfunction   Discharge Condition: Stable  Diet recommendation: Heart healthy diet,  Filed Weights   08/22/21 1741 08/26/21 0719  Weight: 88.5 kg 88.5 kg    History of present illness:  70 year old female with a history of COPD, ongoing tobacco abuse, diabetes mellitus type 2, hypertension, dyslipidemia, RA, GERD came to ED with cough, shortness of breath, URI and wheezing.  She was hypoxemic in the ED.  Chest x-ray was clear.  She was found to be in COPD exacerbation.  Hospital course was complicated by new onset A. fib, RVR started on Cardizem, Eliquis, cardiology was consulted.  Patient underwent TEE/DCCV with successful conversion to normal sinus rhythm  Hospital Course:   Acute hypoxemic respiratory failure -Secondary to COPD exacerbation, chest x-ray unremarkable -Still has wheezing bilaterally, no longer requiring oxygen -Continue with DuoNeb nebulizers every 6 hours, Pulmicort 0.5 mg nebulization every 12 hours, -Continue Mucinex, prednisone 30 mg daily -We will discharge on prednisone taper   Diabetes mellitus type 2 -Hemoglobin A1c is 7.3 -Continue Lantus 10 units subcu daily -Patient was not on medications at home, will discharge on metformin -CBG well controlled -We will discharge on metformin 500 mg p.o. twice daily, Amaryl 1  mg p.o. daily every morning -Follow-up PCP in 1 week   Abnormal TSH/?  Sick euthyroid syndrome -TSH is 0.079, T4 1.10   -Recommend to recheck free T4/TSH in 6 to 8 weeks as outpatient  New onset atrial fibrillation with RVR -Underwent successful TEE/DCCV -Patient was started on Cardizem gtt., transitioned to p.o. Cardizem -Continue Cardizem 180 mg p.o. daily -CHA2DS2VASc score is 4, started on Eliquis -TSH 0.079, free T4 is normal, likely sick euthyroid state will need TSH/T4 repeated in 6 weeks   Hypertension -Continue Cardizem   GERD -Continue Pepcid  Procedures: TEE/DCCV  Consultations: Cardiology  Discharge Exam: Vitals:   08/27/21 0838 08/27/21 1212  BP:  119/77  Pulse:  69  Resp:  15  Temp:  98.3 F (36.8 C)  SpO2: 100%     General: Appears in no acute distress Cardiovascular: S1-S2, regular Respiratory: Scattered wheezing bilaterally  Discharge Instructions   Discharge Instructions     Diet - low sodium heart healthy   Complete by: As directed    Increase activity slowly   Complete by: As directed       Allergies as of 08/27/2021       Reactions   Azithromycin Anaphylaxis, Swelling   Eyes swell   Benazepril Anaphylaxis   Other reaction(s): SWELLING   Cephalexin Anaphylaxis, Swelling   Benadryl [diphenhydramine Hcl] Nausea And Vomiting   Other Other (See Comments)   Reports allergy to insects. Unsure of reaction.   Nexium [esomeprazole Magnesium] Palpitations   Spiriva [tiotropium Bromide Monohydrate] Palpitations        Medication List     STOP taking these medications    amLODipine 10  MG tablet Commonly known as: NORVASC   aspirin EC 81 MG tablet   doxycycline 100 MG tablet Commonly known as: ADOXA       TAKE these medications    albuterol (2.5 MG/3ML) 0.083% nebulizer solution Commonly known as: PROVENTIL Take 3 mLs (2.5 mg total) by nebulization every 6 (six) hours as needed for wheezing or shortness of breath.    albuterol 108 (90 Base) MCG/ACT inhaler Commonly known as: VENTOLIN HFA Inhale 1 puff into the lungs every 6 (six) hours as needed for shortness of breath or wheezing.   apixaban 5 MG Tabs tablet Commonly known as: ELIQUIS Take 1 tablet (5 mg total) by mouth 2 (two) times daily.   atorvastatin 40 MG tablet Commonly known as: LIPITOR Take 40 mg by mouth daily.   blood glucose meter kit and supplies Kit Dispense based on patient and insurance preference. Use up to four times daily as directed.   diltiazem 180 MG 24 hr capsule Commonly known as: CARDIZEM CD Take 1 capsule (180 mg total) by mouth daily. Start taking on: August 28, 2021   EPINEPHrine 0.3 mg/0.3 mL Soaj injection Commonly known as: EPI-PEN Inject 0.3 mg into the muscle as needed for anaphylaxis.   famotidine 20 MG tablet Commonly known as: PEPCID Take 20 mg by mouth 2 (two) times daily.   fluticasone-salmeterol 100-50 MCG/ACT Aepb Commonly known as: ADVAIR Inhale 1 puff into the lungs 2 (two) times daily.   gabapentin 100 MG capsule Commonly known as: NEURONTIN Take 100 mg by mouth 3 (three) times daily as needed (pain).   glimepiride 1 MG tablet Commonly known as: Amaryl Take 1 tablet (1 mg total) by mouth daily with breakfast.   lidocaine 2 % solution Commonly known as: XYLOCAINE Use as directed 15 mLs in the mouth or throat every 4 (four) hours as needed for mouth pain.   loratadine 10 MG tablet Commonly known as: CLARITIN Take 1 tablet (10 mg total) by mouth daily. What changed:  when to take this reasons to take this   metFORMIN 500 MG tablet Commonly known as: Glucophage Take 1 tablet (500 mg total) by mouth 2 (two) times daily with a meal.   multivitamin capsule Take 1 capsule by mouth daily.   predniSONE 10 MG tablet Commonly known as: DELTASONE Prednisone 30 mg po daily x 1 day then Prednisone 20 mg po daily x 1 day then Prednisone 10 mg po daily x 1 day then stop What changed:   medication strength how much to take how to take this when to take this additional instructions   traMADol 50 MG tablet Commonly known as: ULTRAM Take 50 mg by mouth every 6 (six) hours as needed for moderate pain.       Allergies  Allergen Reactions   Azithromycin Anaphylaxis and Swelling    Eyes swell   Benazepril Anaphylaxis    Other reaction(s): SWELLING   Cephalexin Anaphylaxis and Swelling   Benadryl [Diphenhydramine Hcl] Nausea And Vomiting   Other Other (See Comments)    Reports allergy to insects. Unsure of reaction.   Nexium [Esomeprazole Magnesium] Palpitations   Spiriva [Tiotropium Bromide Monohydrate] Palpitations    Follow-up Information     Tolia, Sunit, DO Follow up on 10/07/2021.   Specialties: Cardiology, Vascular Surgery Why: 330pm Contact information: [place] [address] [phone]         Marzetta Board, NP Follow up in 1 week(s).   Specialty: Adult Health Nurse  Practitioner Contact information: [address] [place] [address] [phone]                  The results of significant diagnostics from this hospitalization (including imaging, microbiology, ancillary and laboratory) are listed below for reference.    Significant Diagnostic Studies: DG Chest Port 1 View  Result Date: 08/22/2021 CLINICAL DATA:  Cough, fever and congestion since last night. EXAM: PORTABLE CHEST 1 VIEW COMPARISON:  Chest radiograph 10/04/2020 FINDINGS: The heart size and mediastinal contours are within normal limits. Both lungs are clear. No acute osseous abnormality. IMPRESSION: No acute cardiopulmonary abnormality. Electronically Signed   By: Ileana Roup M.D.   On: 08/22/2021 16:54   ECHOCARDIOGRAM COMPLETE  Result Date: 08/23/2021    ECHOCARDIOGRAM REPORT   Patient Name:   [name] Date of Exam: 08/23/2021 Medical Rec #:  [id number]       Height:       64.0 in Accession #:    [id number]       Weight:       195.0 lb Date of Birth:  Jul 21, 1951       BSA:          1.935 m Patient Age:    37 years        BP:           111/63 mmHg Patient Gender: F               HR:           62 bpm. Exam Location:  Inpatient Procedure: 2D Echo, 3D Echo, Cardiac Doppler, Color Doppler and Strain Analysis Indications:    Abnormal ECG R94.31  History:        Patient has no prior history of Echocardiogram examinations.                 COPD; Risk Factors:Hypertension, Diabetes, Dyslipidemia and                 Current Smoker.  Sonographer:    Darlina Sicilian RDCS Referring Phys: [id number] [facility] RATHORE  Sonographer Comments: Global longitudinal strain was attempted. IMPRESSIONS  1. Global longitudinal strain is -20.6%. Left ventricular ejection fraction, by estimation, is 55 to 60%. The left ventricle has normal function. The left ventricle has no regional wall motion abnormalities. Left ventricular diastolic parameters were normal.  2. Right ventricular systolic function is normal. The right ventricular size is normal.  3. The mitral valve is normal in structure. Trivial mitral valve regurgitation.  4. The aortic valve is normal in structure. Aortic valve regurgitation is not visualized. FINDINGS  Left Ventricle: Global longitudinal strain is -20.6%. Left ventricular ejection fraction, by estimation, is 55 to 60%. The left ventricle has normal function. The left ventricle has no regional wall motion abnormalities. The left ventricular internal cavity size was normal in size. There is no left ventricular hypertrophy. Left ventricular diastolic parameters were normal. Right Ventricle: The right ventricular size is normal. Right vetricular wall thickness was not assessed. Right ventricular systolic function is normal. Left Atrium: Left atrial size was normal in size. Right Atrium: Right atrial size was normal in size. Pericardium: There is no evidence of pericardial effusion. Mitral Valve: The mitral valve is normal in structure.  Trivial mitral valve regurgitation. Tricuspid Valve: The tricuspid valve is normal in structure. Tricuspid valve regurgitation is trivial. Aortic Valve: The aortic valve is normal in structure. Aortic valve regurgitation is not visualized. Aortic valve  mean gradient measures 3.0 mmHg. Aortic valve peak gradient measures 7.2 mmHg. Aortic valve area, by VTI measures 3.41 cm. Pulmonic Valve: The pulmonic valve was not well visualized. Pulmonic valve regurgitation is not visualized. Aorta: The aortic root is normal in size and structure. IAS/Shunts: No atrial level shunt detected by color flow Doppler.  LEFT VENTRICLE PLAX 2D LVIDd:         4.00 cm   Diastology LVIDs:         3.00 cm   LV e' medial:    11.00 cm/s LV PW:         0.80 cm   LV E/e' medial:  9.0 LV IVS:        0.90 cm   LV e' lateral:   9.21 cm/s LVOT diam:     2.10 cm   LV E/e' lateral: 10.7 LV SV:         93 LV SV Index:   48 LVOT Area:     3.46 cm                           3D Volume EF:                          3D EF:        60 %                          LV EDV:       146 ml                          LV ESV:       58 ml                          LV SV:        87 ml RIGHT VENTRICLE RV S prime:     20.30 cm/s TAPSE (M-mode): 2.1 cm LEFT ATRIUM             Index        RIGHT ATRIUM           Index LA diam:        2.90 cm 1.50 cm/m   RA Area:     10.80 cm LA Vol (A2C):   36.1 ml 18.65 ml/m  RA Volume:   23.10 ml  11.94 ml/m LA Vol (A4C):   43.3 ml 22.37 ml/m LA Biplane Vol: 39.5 ml 20.41 ml/m  AORTIC VALVE AV Area (Vmax):    3.39 cm AV Area (Vmean):   3.70 cm AV Area (VTI):     3.41 cm AV Vmax:           134.00 cm/s AV Vmean:          76.900 cm/s AV VTI:            0.272 m AV Peak Grad:      7.2 mmHg AV Mean Grad:      3.0 mmHg LVOT Vmax:         131.00 cm/s LVOT Vmean:        82.200 cm/s LVOT VTI:          0.268 m LVOT/AV VTI ratio: 0.98  AORTA Ao Root diam: 3.30 cm MITRAL VALVE MV Area (PHT): 3.84 cm    SHUNTS MV  Decel Time: 198 msec    Systemic  VTI:  0.27 m MV E velocity: 99.00 cm/s  Systemic Diam: 2.10 cm MV A velocity: 86.20 cm/s MV E/A ratio:  1.15 Dorris Carnes MD Electronically signed by Dorris Carnes MD Signature Date/Time: 08/23/2021/2:51:31 PM    Final     Microbiology: Recent Results (from the past 240 hour(s))  Resp Panel by RT-PCR (Flu A&B, Covid) Nasopharyngeal Swab     Status: None   Collection Time: 08/22/21  5:22 PM   Specimen: Nasopharyngeal Swab; Nasopharyngeal(NP) swabs in vial transport medium  Result Value Ref Range Status   SARS Coronavirus 2 by RT PCR NEGATIVE NEGATIVE Final    Comment: (NOTE) SARS-CoV-2 target nucleic acids are NOT DETECTED.  The SARS-CoV-2 RNA is generally detectable in upper respiratory specimens during the acute phase of infection. The lowest concentration of SARS-CoV-2 viral copies this assay can detect is 138 copies/mL. A negative result does not preclude SARS-Cov-2 infection and should not be used as the sole basis for treatment or other patient management decisions. A negative result may occur with  improper specimen collection/handling, submission of specimen other than nasopharyngeal swab, presence of viral mutation(s) within the areas targeted by this assay, and inadequate number of viral copies(<138 copies/mL). A negative result must be combined with clinical observations, patient history, and epidemiological information. The expected result is Negative.  Fact Sheet for Patients:  EntrepreneurPulse.com.au  Fact Sheet for Healthcare Providers:  IncredibleEmployment.be  This test is no t yet approved or cleared by the [place] FDA and  has been authorized for detection and/or diagnosis of SARS-CoV-2 by FDA under an Emergency Use Authorization (EUA). This EUA will remain  in effect (meaning this test can be used) for the duration of the COVID-19 declaration under Section 564(b)(1) of the Act, 21 U.S.C.section 360bbb-3(b)(1), unless the  authorization is terminated  or revoked sooner.       Influenza A by PCR NEGATIVE NEGATIVE Final   Influenza B by PCR NEGATIVE NEGATIVE Final    Comment: (NOTE) The Xpert Xpress SARS-CoV-2/FLU/RSV plus assay is intended as an aid in the diagnosis of influenza from Nasopharyngeal swab specimens and should not be used as a sole basis for treatment. Nasal washings and aspirates are unacceptable for Xpert Xpress SARS-CoV-2/FLU/RSV testing.  Fact Sheet for Patients: EntrepreneurPulse.com.au  Fact Sheet for Healthcare Providers: IncredibleEmployment.be  This test is not yet approved or cleared by the [place] FDA and has been authorized for detection and/or diagnosis of SARS-CoV-2 by FDA under an Emergency Use Authorization (EUA). This EUA will remain in effect (meaning this test can be used) for the duration of the COVID-19 declaration under Section 564(b)(1) of the Act, 21 U.S.C. section 360bbb-3(b)(1), unless the authorization is terminated or revoked.  Performed at [facility], [place] [address] [place], [place] [address]   Culture, blood (routine x 2)     Status: None   Collection Time: 08/22/21  5:37 PM   Specimen: BLOOD RIGHT HAND  Result Value Ref Range Status   Specimen Description BLOOD RIGHT HAND  Final   Special Requests   Final    BOTTLES DRAWN AEROBIC AND ANAEROBIC Blood Culture results may not be optimal due to an inadequate volume of blood received in culture bottles   Culture   Final    NO GROWTH 5 DAYS Performed at [facility], [place] [address] [place], [place] [address]    Report Status 08/27/2021 FINAL  Final  Culture, blood (  routine x 2)     Status: None (Preliminary result)   Collection Time: 08/23/21  8:20 AM   Specimen: BLOOD  Result Value Ref Range Status   Specimen Description BLOOD LEFT ANTECUBITAL  Final   Special Requests   Final    BOTTLES DRAWN AEROBIC AND ANAEROBIC Blood Culture adequate  volume   Culture   Final    NO GROWTH 4 DAYS Performed at [facility], [place] [address] [place], [place] [address]    Report Status PENDING  Incomplete     Labs: Basic Metabolic Panel: Recent Labs  Lab 08/22/21 1635 08/24/21 0312 08/25/21 0204 08/26/21 0228  NA 135 138 132* 137  K 3.5 4.2 4.1 4.0  CL 100 99 97* 99  CO2 _0 GLUCOSE 151* 226* 335* 206*  BUN 8 20 27* 22  CREATININE 0.84 0.96 1.04* 0.93  CALCIUM 9.5 10.0 9.3 9.3  MG  --  2.1  --   --    Liver Function Tests: Recent Labs  Lab 08/22/21 1635  AST 16  ALT 16  ALKPHOS 89  BILITOT 0.6  PROT 6.8  ALBUMIN 3.7   No results for input(s): LIPASE, AMYLASE in the last 168 hours. No results for input(s): AMMONIA in the last 168 hours. CBC: Recent Labs  Lab 08/22/21 1635 08/25/21 0204 08/26/21 0228 08/27/21 0230  WBC 9.6 22.5* 20.6* 15.4*  NEUTROABS 8.7*  --   --   --   HGB 12.5 12.5 12.6 13.5  HCT 40.1 38.4 39.0 42.2  MCV 84.4 83.1 83.2 82.9  PLT 238 244 263 255   Cardiac Enzymes: No results for input(s): CKTOTAL, CKMB, CKMBINDEX, TROPONINI in the last 168 hours. BNP: BNP (last 3 results) Recent Labs    08/22/21 1635  BNP 112.3*    ProBNP (last 3 results) No results for input(s): PROBNP in the last 8760 hours.  CBG: Recent Labs  Lab 08/26/21 1220 08/26/21 1631 08/26/21 2053 08/27/21 0725 08/27/21 1211  GLUCAP 159* 104* 107* 148* 179*       Signed:  Oswald Hillock MD.  [facility] 08/27/2021, 2:01 PM

## 2021-08-27 NOTE — Care Management Important Message (Signed)
Important Message  Patient Details  Name: JAXYN MESTAS MRN: [id number] Date of Birth: 1951/08/15   Medicare Important Message Given:  Yes     Sherilyn Banker 08/27/2021, 3:36 PM

## 2021-08-27 NOTE — Progress Notes (Signed)
Subjective:  Patient seen and examined at approximately. Eating breakfast at bedside  Denies any chest pain, shortness of breath, lower extremity swelling.   Underwent TEE guided cardioversion yesterday without any immediate complications.   Remains in normal sinus rhythm.    Intake/Output from previous day:  I/O last 3 completed shifts: In: 1261.6 [P.O.:910; I.V.:351.6] Out: 500 [Urine:500] No intake/output data recorded.  Blood pressure 126/74, pulse 74, temperature 98 F (36.7 C), temperature source Oral, resp. rate 18, height 5' 5"  (1.651 m), weight 88.5 kg, SpO2 100 %. Physical Exam Vitals reviewed.  HENT:     Head: Normocephalic and atraumatic.  Cardiovascular:     Rate and Rhythm: Normal rate and regular rhythm.     Pulses: Normal pulses and intact distal pulses.     Heart sounds: S1 normal and S2 normal. No murmur heard.   No gallop.  Pulmonary:     Effort: Pulmonary effort is normal. No respiratory distress.     Breath sounds: No wheezing, rhonchi or rales.  Abdominal:     General: Bowel sounds are normal. There is no distension.     Palpations: Abdomen is soft.  Musculoskeletal:     Right lower leg: No edema.     Left lower leg: No edema.  Skin:    General: Skin is warm and dry.  Neurological:     Mental Status: [name] is alert.   Lab Results: BMP BNP (last 3 results) Recent Labs    08/22/21 1635  BNP 112.3*    ProBNP (last 3 results) No results for input(s): PROBNP in the last 8760 hours. BMP Latest Ref Rng & Units 08/26/2021 08/25/2021 08/24/2021  Glucose 70 - 99 mg/dL 206(H) 335(H) 226(H)  BUN 8 - 23 mg/dL 22 27(H) 20  Creatinine 0.44 - 1.00 mg/dL 0.93 1.04(H) 0.96  Sodium 135 - 145 mmol/L 137 132(L) 138  Potassium 3.5 - 5.1 mmol/L 4.0 4.1 4.2  Chloride 98 - 111 mmol/L 99 97(L) 99  CO2 22 - 32 mmol/L 31 26 31   Calcium 8.9 - 10.3 mg/dL 9.3 9.3 10.0   Hepatic Function Latest Ref Rng & Units 08/22/2021 09/01/2012 08/10/2007  Total Protein 6.5 - 8.1  g/dL 6.8 6.8 6.6  Albumin 3.5 - 5.0 g/dL 3.7 3.7 3.7  AST 15 - 41 U/L 16 12 16   ALT 0 - 44 U/L 16 8 14   Alk Phosphatase 38 - 126 U/L 89 70 88  Total Bilirubin 0.3 - 1.2 mg/dL 0.6 0.3 0.7   CBC Latest Ref Rng & Units 08/27/2021 08/26/2021 08/25/2021  WBC 4.0 - 10.5 K/uL 15.4(H) 20.6(H) 22.5(H)  Hemoglobin 12.0 - 15.0 g/dL 13.5 12.6 12.5  Hematocrit 36.0 - 46.0 % 42.2 39.0 38.4  Platelets 150 - 400 K/uL 255 263 244   Lipid Panel  No results found for: CHOL, TRIG, HDL, CHOLHDL, VLDL, LDLCALC, LDLDIRECT Cardiac Panel (last 3 results) No results for input(s): CKTOTAL, CKMB, TROPONINI, RELINDX in the last 72 hours.  HEMOGLOBIN A1C Lab Results  Component Value Date   HGBA1C 7.3 (H) 08/23/2021   MPG 163 08/23/2021   TSH Recent Labs    08/23/21 1820  TSH 0.079*   Imaging: No results found.  Cardiac Studies:  Echocardiogram: 08/23/2021: LVEF 55-60%, normal wall motion, right ventricular size and function normal, trivial MR, global longitudinal strain -20.6%   Stress Testing:  None   Heart Catheterization: None  EKG:  08/22/2021: Sinus tach, 106 bpm, normal axis, subtle ST-T changes in the anterolateral leads, without underlying  injury pattern.   08/23/2021 1333: A. fib RVR, 123 bpm, diffuse nonspecific ST-T changes likely secondary to rate related but ischemia cannot be ruled out.  08/25/2021: Atrial fibrillation, 108 bpm.  Diffuse nonspecific ST-T changes unchanged compared to previous.  08/26/2021: Normal sinus rhythm, 70 bpm.  Telemetry:  SR   Scheduled Meds:  apixaban  5 mg Oral BID   atorvastatin  40 mg Oral Daily   budesonide (PULMICORT) nebulizer solution  0.5 mg Nebulization Q12H   diltiazem  180 mg Oral Daily   famotidine  20 mg Oral BID   guaiFENesin  600 mg Oral BID   insulin aspart  0-15 Units Subcutaneous TID WC   insulin glargine-yfgn  10 Units Subcutaneous Daily   ipratropium-albuterol  3 mL Nebulization Q6H   nicotine  7 mg Transdermal Daily    polyethylene glycol  17 g Oral Daily   predniSONE  30 mg Oral Q breakfast   Continuous Infusions:   PRN Meds:.acetaminophen, albuterol, phenol  Assessment/Plan:  [name] is a 70 y.o. African-American female whose past medical history and cardiovascular risk factors include: COPD, tobacco use, diet-controlled type II diabetes, hypertension, hyperlipidemia, rheumatoid arthritis, GERD .   Impression:  Paroxysmal atrial fibrillation: Initially placed on IV Cardizem then transition to oral Cardizem. Patient remains symptomatic with minimal exertion. S/P TEE guided cardioversion 08/26/2021 and restored normal sinus rhythm with 200 J x 1.  Remains in normal sinus rhythm. Increase Cardizem to 180 mg p.o. daily.   Continue Eliquis.   Long-term oral anticoagulation: Indication: A. fib (chronicity unknown) No prior history of intracranial or gastrointestinal bleeding. No prior falls. CHA2DS2-VASc SCORE is 4 which correlates to 4% risk of stroke per year (HTN, age, diabetes, gender) Patient and daughter are aware of risks, benefits, and alternatives to oral anticoagulation.  Patient is aware of recommendations regarding monitoring for evidence of bleeding and evaluation in the context of fall or injury. Anticoagulation started 08/23/2021.   Abnormal EKG: Suspect nonspecific ST-T wave changes to be related to rapid ventricular late, however ischemia cannot be excluded.  Given no chest pain and negative troponins recommend outpatient follow-up with stress test.   Diet-controlled diabetes: a1c >7.0 consider treatment; will defer management to primary team.   Hyperlipidemia: Continue statin therapy.   Hypertension: When appropriate consider addition of ARB given her diabetes - defer to primary team.   Smoking: Reiterated the importance of smoking cessation, particularly given COPD and hypoxia.    Thyroid dysfunction: We will defer management to primary team.   Outpatient appointment  has been made and discharge paperwork updated.  Cardiology will sign off for now, please reach out if any questions or concerns arise during this hospitalization.  Total time spent: 35 minutes.  Mechele Claude [facility]  Pager: [phone] Office: [phone] 08/27/2021 8:45 AM

## 2021-08-28 LAB — CULTURE, BLOOD (ROUTINE X 2)
Culture: NO GROWTH
Special Requests: ADEQUATE

## 2021-08-29 ENCOUNTER — Encounter (HOSPITAL_COMMUNITY): Payer: Self-pay | Admitting: Cardiology

## 2021-09-08 LAB — ECHO TEE
AV Mean grad: 3 mmHg
AV Peak grad: 6.1 mmHg
Ao pk vel: 1.23 m/s

## 2021-10-07 ENCOUNTER — Ambulatory Visit: Payer: Medicare Other | Admitting: Cardiology

## 2021-10-07 ENCOUNTER — Encounter: Payer: Self-pay | Admitting: Cardiology

## 2021-10-07 ENCOUNTER — Other Ambulatory Visit: Payer: Self-pay

## 2021-10-07 VITALS — BP 158/81 | HR 60 | Temp 97.9°F | Resp 16 | Ht 65.0 in | Wt 190.2 lb

## 2021-10-07 DIAGNOSIS — E119 Type 2 diabetes mellitus without complications: Secondary | ICD-10-CM

## 2021-10-07 DIAGNOSIS — Z87891 Personal history of nicotine dependence: Secondary | ICD-10-CM

## 2021-10-07 DIAGNOSIS — I480 Paroxysmal atrial fibrillation: Secondary | ICD-10-CM

## 2021-10-07 DIAGNOSIS — Z7901 Long term (current) use of anticoagulants: Secondary | ICD-10-CM

## 2021-10-07 DIAGNOSIS — J449 Chronic obstructive pulmonary disease, unspecified: Secondary | ICD-10-CM

## 2021-10-07 DIAGNOSIS — Z9889 Other specified postprocedural states: Secondary | ICD-10-CM

## 2021-10-07 DIAGNOSIS — E782 Mixed hyperlipidemia: Secondary | ICD-10-CM

## 2021-10-07 DIAGNOSIS — Z9289 Personal history of other medical treatment: Secondary | ICD-10-CM

## 2021-10-07 DIAGNOSIS — I10 Essential (primary) hypertension: Secondary | ICD-10-CM

## 2021-10-07 MED ORDER — LOSARTAN POTASSIUM 50 MG PO TABS: 50.0000 mg | ORAL_TABLET | Freq: Every day | ORAL | 0 refills | Status: DC

## 2021-10-07 MED ORDER — HYDROCHLOROTHIAZIDE 25 MG PO TABS: 25.0000 mg | ORAL_TABLET | Freq: Every morning | ORAL | 0 refills | Status: DC

## 2021-10-07 NOTE — Progress Notes (Signed)
Date:  10/07/2021   ID:  [name], DOB 11/22/1950, MRN [id number]  PCP:  Marzetta Board, NP  Cardiologist:  Rex Kras, DO, [facility] (established care 08/24/2021)  Chief Complaint  Patient presents with   Hospitalization Follow-up   Atrial Fibrillation   New Patient (Initial Visit)    HPI  [name] is a 71 y.o. African-American female who presents to the office with a chief complaint of " hospital follow-up, atrial fibrillation management." Patient's past medical history and cardiovascular risk factors include: Paroxysmal atrial fibrillation status post TEE guided cardioversion (08/2021), COPD, tobacco use, diet-controlled type II diabetes, hypertension, hyperlipidemia, rheumatoid arthritis, GERD .    Patient was initially seen in consultation during her hospitalization in November 2022 for COPD exacerbation during the hospitalization she went into A. fib with RVR which was not controlled with IV Cardizem and therefore underwent TEE guided cardioversion.  Normal sinus rhythm was restored and she was discharged on oral medications.  She is tolerating oral anticoagulation well without any side effects or intolerances.  She does not endorse any evidence of bleeding.  Patient states that her blood pressures have been high and is requesting assistance.  She does not follow-up with her PCP for another 2 weeks.  Patient states that her home blood pressures in the morning are 180 mmHg and evening blood pressures are higher than 55 mmHg.  She is only on losartan 25 mg p.o. daily.  She has successfully stopped smoking as of 08/22/2021.  FUNCTIONAL STATUS: No structured exercise program or daily routine.   ALLERGIES: Allergies  Allergen Reactions   Azithromycin Anaphylaxis and Swelling    Eyes swell   Benazepril Anaphylaxis    Other reaction(s): SWELLING   Cephalexin Anaphylaxis and Swelling   Benadryl [Diphenhydramine Hcl] Nausea And Vomiting   Other Other (See Comments)     Reports allergy to insects. Unsure of reaction.   Nexium [Esomeprazole Magnesium] Palpitations   Spiriva [Tiotropium Bromide Monohydrate] Palpitations    MEDICATION LIST PRIOR TO VISIT: Current Meds  Medication Sig   albuterol (PROVENTIL) (2.5 MG/3ML) 0.083% nebulizer solution Take 3 mLs (2.5 mg total) by nebulization every 6 (six) hours as needed for wheezing or shortness of breath.   albuterol (VENTOLIN HFA) 108 (90 Base) MCG/ACT inhaler Inhale 1 puff into the lungs every 6 (six) hours as needed for shortness of breath or wheezing.   apixaban (ELIQUIS) 5 MG TABS tablet Take 1 tablet (5 mg total) by mouth 2 (two) times daily.   atorvastatin (LIPITOR) 40 MG tablet Take 40 mg by mouth daily.   blood glucose meter kit and supplies KIT Dispense based on patient and insurance preference. Use up to four times daily as directed.   diltiazem (CARDIZEM CD) 180 MG 24 hr capsule Take 1 capsule (180 mg total) by mouth daily.   EPINEPHrine 0.3 mg/0.3 mL IJ SOAJ injection Inject 0.3 mg into the muscle as needed for anaphylaxis.   famotidine (PEPCID) 20 MG tablet Take 20 mg by mouth 2 (two) times daily.   fluticasone-salmeterol (ADVAIR) 100-50 MCG/ACT AEPB Inhale 1 puff into the lungs 2 (two) times daily.   gabapentin (NEURONTIN) 100 MG capsule Take 100 mg by mouth 3 (three) times daily as needed (pain).   hydrochlorothiazide (HYDRODIURIL) 25 MG tablet Take 1 tablet (25 mg total) by mouth every morning.   lidocaine (XYLOCAINE) 2 % solution Use as directed 15 mLs in the mouth or throat every 4 (four) hours as needed for mouth pain.  loratadine (CLARITIN) 10 MG tablet Take 1 tablet (10 mg total) by mouth daily. (Patient taking differently: Take 10 mg by mouth daily as needed for allergies.)   metFORMIN (GLUCOPHAGE) 500 MG tablet Take 1 tablet (500 mg total) by mouth 2 (two) times daily with a meal.   Multiple Vitamin (MULTIVITAMIN) capsule Take 1 capsule by mouth daily.   traMADol (ULTRAM) 50 MG tablet Take  50 mg by mouth every 6 (six) hours as needed for moderate pain.   [DISCONTINUED] losartan (COZAAR) 25 MG tablet Take 25 mg by mouth daily.     PAST MEDICAL HISTORY: Past Medical History:  Diagnosis Date   Arthritis    Asthma    COPD (chronic obstructive pulmonary disease) ([place])    Diabetes mellitus    Heart murmur    High cholesterol    Hyperlipidemia    Hypertension    Kidney infection    Kidney stone    Reflux     PAST SURGICAL HISTORY: Past Surgical History:  Procedure Laterality Date   BUBBLE STUDY  08/26/2021   Procedure: BUBBLE STUDY;  Surgeon: Rex Kras, DO;  Location: [place];  Service: Cardiovascular;;   CARDIOVERSION N/A 08/26/2021   Procedure: CARDIOVERSION;  Surgeon: Rex Kras, DO;  Location: [place];  Service: Cardiovascular;  Laterality: N/A;   fillopian tube removal     right breast cyst removal     TEE WITHOUT CARDIOVERSION N/A 08/26/2021   Procedure: TRANSESOPHAGEAL ECHOCARDIOGRAM (TEE);  Surgeon: Rex Kras, DO;  Location: [facility];  Service: Cardiovascular;  Laterality: N/A;    FAMILY HISTORY: The patient family history includes CVA in an other family member; Diabetes in her father; Diabetes Mellitus II in an other family member; Hypertension in her sister and another family member; Stroke in her mother.  SOCIAL HISTORY:  The patient  reports that she quit smoking about 6 weeks ago. Her smoking use included cigarettes. She started smoking about 55 years ago. She has a 26.00 pack-year smoking history. Her smokeless tobacco use includes chew. She reports that she does not currently use alcohol. She reports that she does not use drugs.  REVIEW OF SYSTEMS: Review of Systems  Constitutional: Negative for chills and fever.  HENT:  Negative for hoarse voice and nosebleeds.   Eyes:  Negative for discharge, double vision and pain.  Cardiovascular:  Negative for chest pain, claudication, dyspnea on exertion, leg swelling, near-syncope,  orthopnea, palpitations, paroxysmal nocturnal dyspnea and syncope.  Respiratory:  Negative for hemoptysis and shortness of breath.   Musculoskeletal:  Negative for muscle cramps and myalgias.  Gastrointestinal:  Negative for abdominal pain, constipation, diarrhea, hematemesis, hematochezia, melena, nausea and vomiting.  Neurological:  Negative for dizziness and light-headedness.   PHYSICAL EXAM: Vitals with BMI 10/07/2021 08/27/2021 08/27/2021  Height _0  - -  Weight 190 lbs 3 oz - -  BMI 00.34 - -  Systolic 917 915 056  Diastolic 81 77 74  Pulse 60 69 74    CONSTITUTIONAL: Well-developed and well-nourished. No acute distress.  SKIN: Skin is warm and dry. No rash noted. No cyanosis. No pallor. No jaundice HEAD: Normocephalic and atraumatic.  EYES: No scleral icterus MOUTH/THROAT: Moist oral membranes.  NECK: No JVD present. No thyromegaly noted. No carotid bruits  LYMPHATIC: No visible cervical adenopathy.  CHEST Normal respiratory effort. No intercostal retractions  LUNGS: Clear to auscultation bilaterally.  No stridor. No wheezes. No rales.  CARDIOVASCULAR: Regular rate and rhythm, positive S1-S2, no murmurs rubs or gallops appreciated. ABDOMINAL:  Obese, soft, nontender, nondistended, positive bowel sounds all 4 quadrants no apparent ascites.  EXTREMITIES: No peripheral edema, warm to touch, 2+ bilateral DP and PT pulses HEMATOLOGIC: No significant bruising NEUROLOGIC: Oriented to person, place, and time. Nonfocal. Normal muscle tone.  PSYCHIATRIC: Normal mood and affect. Normal behavior. Cooperative  CARDIAC DATABASE: EKG: 10/07/2021: Normal sinus rhythm, 60 bpm, without underlying ischemia or injury pattern.  Echocardiogram: 08/23/2021: LVEF 55-60%, normal wall motion, right ventricular size and function normal, trivial MR, global longitudinal strain -20.6%   Stress Testing: No results found for this or any previous visit from the past 1095 days.   Heart  Catheterization: None  TEE guided cardioversion: 08/26/2021: 200 J x 1 restored to sinus rhythm  LABORATORY DATA: CBC Latest Ref Rng & Units 08/27/2021 08/26/2021 08/25/2021  WBC 4.0 - 10.5 K/uL 15.4(H) 20.6(H) 22.5(H)  Hemoglobin 12.0 - 15.0 g/dL 13.5 12.6 12.5  Hematocrit 36.0 - 46.0 % 42.2 39.0 38.4  Platelets 150 - 400 K/uL 255 263 244    CMP Latest Ref Rng & Units 08/26/2021 08/25/2021 08/24/2021  Glucose 70 - 99 mg/dL 206(H) 335(H) 226(H)  BUN 8 - 23 mg/dL 22 27(H) 20  Creatinine 0.44 - 1.00 mg/dL 0.93 1.04(H) 0.96  Sodium 135 - 145 mmol/L 137 132(L) 138  Potassium 3.5 - 5.1 mmol/L 4.0 4.1 4.2  Chloride 98 - 111 mmol/L 99 97(L) 99  CO2 22 - 32 mmol/L _0 Calcium 8.9 - 10.3 mg/dL 9.3 9.3 10.0  Total Protein 6.5 - 8.1 g/dL - - -  Total Bilirubin 0.3 - 1.2 mg/dL - - -  Alkaline Phos 38 - 126 U/L - - -  AST 15 - 41 U/L - - -  ALT 0 - 44 U/L - - -    Lipid Panel  No results found for: CHOL, TRIG, HDL, CHOLHDL, VLDL, LDLCALC, LDLDIRECT, LABVLDL  No components found for: NTPROBNP No results for input(s): PROBNP in the last 8760 hours. Recent Labs    08/23/21 1820  TSH 0.079*    BMP Recent Labs    08/24/21 0312 08/25/21 0204 08/26/21 0228  NA 138 132* 137  K 4.2 4.1 4.0  CL 99 97* 99  CO2 _1 GLUCOSE 226* 335* 206*  BUN 20 27* 22  CREATININE 0.96 1.04* 0.93  CALCIUM 10.0 9.3 9.3  GFRNONAA >60 59* >60    HEMOGLOBIN A1C Lab Results  Component Value Date   HGBA1C 7.3 (H) 08/23/2021   MPG 163 08/23/2021    IMPRESSION:    ICD-10-CM   1. Paroxysmal A-fib (HCC)  I48.0 EKG 12-Lead    PCV MYOCARDIAL PERFUSION WO LEXISCAN    2. Long term (current) use of anticoagulants  Z79.01     3. History of cardioversion  Z98.890     4. Benign hypertension  I10 losartan (COZAAR) 50 MG tablet    hydrochlorothiazide (HYDRODIURIL) 25 MG tablet    Basic metabolic panel    Magnesium    5. Mixed hyperlipidemia  E78.2     6. Non-insulin dependent type 2  diabetes mellitus ([place])  E11.9 PCV MYOCARDIAL PERFUSION WO LEXISCAN    7. Chronic obstructive pulmonary disease, unspecified COPD type ([place])  J44.9     8. Former smoker  Z87.891        RECOMMENDATIONS: [name] is a 71 y.o. African-American female whose past medical history and cardiac risk factors include: Paroxysmal atrial fibrillation status post TEE guided cardioversion (08/2021), COPD, tobacco use, diet-controlled  type II diabetes, hypertension, hyperlipidemia, rheumatoid arthritis, GERD .    Paroxysmal A-fib (HCC) Rate control: Cardizem. Rhythm control: N/A. Thromboembolic prophylaxis: Eliquis Status post TEE guided cardioversion 08/26/2021 CHA2DS2-VASc SCORE is 4 which correlates to 4% risk of stroke per year (HTN, age, diabetes, gender) Given new onset of atrial fibrillation and other cardiovascular risk factors including diabetes and ASCVD risk or proceed with exercise nuclear stress test to evaluate for reversible ischemia.  Long term (current) use of anticoagulants Indication: Paroxysmal atrial fibrillation and recent cardioversion 08/26/2021 Does not endorse evidence of bleeding. Reemphasized the risks, benefits, and alternatives to oral anticoagulation  Benign hypertension Office blood pressures are now well controlled. Patient states that her home blood pressure are not well controlled. She is requesting assistance with blood pressure management until she has a chance to follow-up with PCP which is more than 2 weeks away. Will increase losartan to 50 mg p.o. every afternoon.  She currently has losartan 25 mg tablets she will take 2 of them at night until she finishes the current dose. Start hydrochlorothiazide 25 mg p.o. every morning Check BMP in 1 week to evaluate kidney function and electrolytes Reemphasized the importance of low-salt diet.  Mixed hyperlipidemia Currently on atorvastatin.   She denies myalgia or other side effects. Currently managed by  primary care provider.   Non-insulin dependent type 2 diabetes mellitus ([place]) Currently managed by PCP. Continue losartan, statin therapy Educated in the importance of glycemic control given her other cardiovascular risk factors.  FINAL MEDICATION LIST END OF ENCOUNTER: Meds ordered this encounter  Medications   losartan (COZAAR) 50 MG tablet    Sig: Take 1 tablet (50 mg total) by mouth daily at 10 pm.    Dispense:  30 tablet    Refill:  0   hydrochlorothiazide (HYDRODIURIL) 25 MG tablet    Sig: Take 1 tablet (25 mg total) by mouth every morning.    Dispense:  90 tablet    Refill:  0    Medications Discontinued During This Encounter  Medication Reason   predniSONE (DELTASONE) 10 MG tablet    losartan (COZAAR) 25 MG tablet Reorder     Current Outpatient Medications:    albuterol (PROVENTIL) (2.5 MG/3ML) 0.083% nebulizer solution, Take 3 mLs (2.5 mg total) by nebulization every 6 (six) hours as needed for wheezing or shortness of breath., Disp: 75 mL, Rfl: 12   albuterol (VENTOLIN HFA) 108 (90 Base) MCG/ACT inhaler, Inhale 1 puff into the lungs every 6 (six) hours as needed for shortness of breath or wheezing., Disp: , Rfl:    apixaban (ELIQUIS) 5 MG TABS tablet, Take 1 tablet (5 mg total) by mouth 2 (two) times daily., Disp: 60 tablet, Rfl: 2   atorvastatin (LIPITOR) 40 MG tablet, Take 40 mg by mouth daily., Disp: , Rfl:    blood glucose meter kit and supplies KIT, Dispense based on patient and insurance preference. Use up to four times daily as directed., Disp: 1 each, Rfl: 0   diltiazem (CARDIZEM CD) 180 MG 24 hr capsule, Take 1 capsule (180 mg total) by mouth daily., Disp: 30 capsule, Rfl: 3   EPINEPHrine 0.3 mg/0.3 mL IJ SOAJ injection, Inject 0.3 mg into the muscle as needed for anaphylaxis., Disp: 1 each, Rfl: 1   famotidine (PEPCID) 20 MG tablet, Take 20 mg by mouth 2 (two) times daily., Disp: , Rfl:    fluticasone-salmeterol (ADVAIR) 100-50 MCG/ACT AEPB, Inhale 1 puff into  the lungs 2 (two) times daily., Disp: ,  Rfl:    gabapentin (NEURONTIN) 100 MG capsule, Take 100 mg by mouth 3 (three) times daily as needed (pain)., Disp: , Rfl:    hydrochlorothiazide (HYDRODIURIL) 25 MG tablet, Take 1 tablet (25 mg total) by mouth every morning., Disp: 90 tablet, Rfl: 0   lidocaine (XYLOCAINE) 2 % solution, Use as directed 15 mLs in the mouth or throat every 4 (four) hours as needed for mouth pain., Disp: 100 mL, Rfl: 0   loratadine (CLARITIN) 10 MG tablet, Take 1 tablet (10 mg total) by mouth daily. (Patient taking differently: Take 10 mg by mouth daily as needed for allergies.), Disp: 30 tablet, Rfl: 1   metFORMIN (GLUCOPHAGE) 500 MG tablet, Take 1 tablet (500 mg total) by mouth 2 (two) times daily with a meal., Disp: 30 tablet, Rfl: 11   Multiple Vitamin (MULTIVITAMIN) capsule, Take 1 capsule by mouth daily., Disp: , Rfl:    traMADol (ULTRAM) 50 MG tablet, Take 50 mg by mouth every 6 (six) hours as needed for moderate pain., Disp: , Rfl:    glimepiride (AMARYL) 1 MG tablet, Take 1 tablet (1 mg total) by mouth daily with breakfast., Disp: 30 tablet, Rfl: 11   losartan (COZAAR) 50 MG tablet, Take 1 tablet (50 mg total) by mouth daily at 10 pm., Disp: 30 tablet, Rfl: 0  Orders Placed This Encounter  Procedures   Basic metabolic panel   Magnesium   PCV MYOCARDIAL PERFUSION WO LEXISCAN   EKG 12-Lead    There are no Patient Instructions on file for this visit.   --Continue cardiac medications as reconciled in final medication list. --Return in about 6 weeks (around 11/18/2021) for Follow up, A. fib, Review test results. Or sooner if needed. --Continue follow-up with your primary care physician regarding the management of your other chronic comorbid conditions.  Patient's questions and concerns were addressed to her satisfaction. She voices understanding of the instructions provided during this encounter.   This note was created using a voice recognition software as a result  there may be grammatical errors inadvertently enclosed that do not reflect the nature of this encounter. Every attempt is made to correct such errors.  Rex Kras, [place], [facility]  Pager: [phone] Office: [phone]

## 2021-10-15 ENCOUNTER — Other Ambulatory Visit: Payer: Self-pay

## 2021-10-15 DIAGNOSIS — I10 Essential (primary) hypertension: Secondary | ICD-10-CM

## 2021-10-21 LAB — BASIC METABOLIC PANEL
BUN/Creatinine Ratio: 17 (ref 12–28)
BUN: 19 mg/dL (ref 8–27)
CO2: 30 mmol/L — ABNORMAL HIGH (ref 20–29)
Calcium: 10.4 mg/dL — ABNORMAL HIGH (ref 8.7–10.3)
Chloride: 97 mmol/L (ref 96–106)
Creatinine, Ser: 1.1 mg/dL — ABNORMAL HIGH (ref 0.57–1.00)
Glucose: 69 mg/dL — ABNORMAL LOW (ref 70–99)
Potassium: 3.4 mmol/L — ABNORMAL LOW (ref 3.5–5.2)
Sodium: 142 mmol/L (ref 134–144)
eGFR: 55 mL/min/{1.73_m2} — ABNORMAL LOW (ref >59)

## 2021-10-21 LAB — MAGNESIUM: Magnesium: 1.7 mg/dL (ref 1.6–2.3)

## 2021-10-22 ENCOUNTER — Other Ambulatory Visit: Payer: Self-pay

## 2021-10-22 MED ORDER — POTASSIUM CHLORIDE ER 10 MEQ PO TBCR: 10.0000 meq | EXTENDED_RELEASE_TABLET | Freq: Every day | ORAL | 0 refills | Status: DC

## 2021-10-22 NOTE — Progress Notes (Signed)
Patient returned our call she is aware patient preferred medication, it has been sent already

## 2021-10-22 NOTE — Progress Notes (Signed)
Tried calling patient no answer wasn't able to leave vm

## 2021-11-01 ENCOUNTER — Other Ambulatory Visit: Payer: Medicare Other

## 2021-11-02 ENCOUNTER — Other Ambulatory Visit: Payer: Medicare Other

## 2021-11-15 ENCOUNTER — Other Ambulatory Visit: Payer: Medicare Other

## 2021-11-17 ENCOUNTER — Other Ambulatory Visit: Payer: Medicare Other

## 2021-11-17 ENCOUNTER — Other Ambulatory Visit: Payer: Self-pay | Admitting: Cardiology

## 2021-11-18 ENCOUNTER — Ambulatory Visit: Payer: Medicare Other | Admitting: Cardiology

## 2021-11-22 ENCOUNTER — Telehealth: Payer: Self-pay

## 2021-11-22 ENCOUNTER — Telehealth: Payer: Self-pay | Admitting: Cardiology

## 2021-11-22 NOTE — Telephone Encounter (Signed)
Patient says she fell the other day and hit her head. She has since had x-rays and was told her head looked fine, no issues, but she is calling to confirm if she is still okay to have her stress test on 3/1 or see if she should be off the treadmill (have the medication instead).

## 2021-11-22 NOTE — Telephone Encounter (Signed)
Pt called and stated that Saturday night 11/20/2021 she fell off the bed while sleeping. She went to the doctors and they told her that her head was swollen but she should not worry. She wants to know if this will interfere with her upcoming procedure. Please advise.

## 2021-11-23 NOTE — Telephone Encounter (Signed)
[facility]   Dr. Odis Hollingsheadolia

## 2021-11-23 NOTE — Telephone Encounter (Signed)
No, it should not effect her upcoming stress test.   Dr. Terri Skains

## 2021-11-24 ENCOUNTER — Other Ambulatory Visit: Payer: Medicare Other

## 2021-11-24 NOTE — Telephone Encounter (Signed)
Called and spoke to pt, pt voiced understanding.

## 2021-11-25 ENCOUNTER — Other Ambulatory Visit: Payer: Self-pay | Admitting: Cardiology

## 2021-11-25 ENCOUNTER — Ambulatory Visit: Payer: Medicare Other | Admitting: Cardiology

## 2021-11-25 DIAGNOSIS — I480 Paroxysmal atrial fibrillation: Secondary | ICD-10-CM

## 2021-11-29 ENCOUNTER — Other Ambulatory Visit: Payer: Self-pay | Admitting: Cardiology

## 2021-11-29 DIAGNOSIS — I10 Essential (primary) hypertension: Secondary | ICD-10-CM

## 2021-12-15 ENCOUNTER — Other Ambulatory Visit: Payer: Medicare Other

## 2021-12-19 ENCOUNTER — Other Ambulatory Visit: Payer: Self-pay | Admitting: Cardiology

## 2021-12-20 ENCOUNTER — Ambulatory Visit: Payer: Medicare Other | Admitting: Cardiology

## 2021-12-24 ENCOUNTER — Telehealth: Payer: Self-pay | Admitting: Cardiology

## 2021-12-24 ENCOUNTER — Ambulatory Visit: Payer: Medicare Other

## 2021-12-24 VITALS — BP 112/70 | HR 172

## 2021-12-24 MED ORDER — METOPROLOL TARTRATE 25 MG PO TABS: 25.0000 mg | ORAL_TABLET | Freq: Two times a day (BID) | ORAL | 2 refills | Status: DC

## 2021-12-24 NOTE — Progress Notes (Unsigned)
Patient presented here for a routine treadmill exercise stress test, however patient was in A-fib with RVR with heart rate of 160 to 170 bpm although asymptomatic. ? ?Vitals:  ? 12/24/21 1246  ?BP: 112/70  ?Pulse: (!) 172  ? ? ?Cardiac exam: Tachycardia present.  No gallop, no murmur appreciated. ?Chest clear.: ?Extremities: No edema. ? ? ?Current Outpatient Medications:  ?  albuterol (PROVENTIL) (2.5 MG/3ML) 0.083% nebulizer solution, Take 3 mLs (2.5 mg total) by nebulization every 6 (six) hours as needed for wheezing or shortness of breath., Disp: 75 mL, Rfl: 12 ?  albuterol (VENTOLIN HFA) 108 (90 Base) MCG/ACT inhaler, Inhale 1 puff into the lungs every 6 (six) hours as needed for shortness of breath or wheezing., Disp: , Rfl:  ?  apixaban (ELIQUIS) 5 MG TABS tablet, Take 1 tablet (5 mg total) by mouth 2 (two) times daily., Disp: 60 tablet, Rfl: 2 ?  atorvastatin (LIPITOR) 40 MG tablet, Take 40 mg by mouth daily., Disp: , Rfl:  ?  blood glucose meter kit and supplies KIT, Dispense based on patient and insurance preference. Use up to four times daily as directed., Disp: 1 each, Rfl: 0 ?  diltiazem (CARDIZEM CD) 180 MG 24 hr capsule, Take 1 capsule (180 mg total) by mouth daily., Disp: 30 capsule, Rfl: 3 ?  EPINEPHrine 0.3 mg/0.3 mL IJ SOAJ injection, Inject 0.3 mg into the muscle as needed for anaphylaxis., Disp: 1 each, Rfl: 1 ?  famotidine (PEPCID) 20 MG tablet, Take 20 mg by mouth 2 (two) times daily., Disp: , Rfl:  ?  fluticasone-salmeterol (ADVAIR) 100-50 MCG/ACT AEPB, Inhale 1 puff into the lungs 2 (two) times daily., Disp: , Rfl:  ?  gabapentin (NEURONTIN) 100 MG capsule, Take 100 mg by mouth 3 (three) times daily as needed (pain)., Disp: , Rfl:  ?  glimepiride (AMARYL) 1 MG tablet, Take 1 tablet (1 mg total) by mouth daily with breakfast., Disp: 30 tablet, Rfl: 11 ?  hydrochlorothiazide (HYDRODIURIL) 25 MG tablet, TAKE 1 TABLET BY MOUTH ONCE DAILY IN THE MORNING, Disp: 90 tablet, Rfl: 0 ?  lidocaine  (XYLOCAINE) 2 % solution, Use as directed 15 mLs in the mouth or throat every 4 (four) hours as needed for mouth pain., Disp: 100 mL, Rfl: 0 ?  loratadine (CLARITIN) 10 MG tablet, Take 1 tablet (10 mg total) by mouth daily. (Patient taking differently: Take 10 mg by mouth daily as needed for allergies.), Disp: 30 tablet, Rfl: 1 ?  losartan (COZAAR) 50 MG tablet, Take 1 tablet (50 mg total) by mouth daily at 10 pm., Disp: 30 tablet, Rfl: 0 ?  metFORMIN (GLUCOPHAGE) 500 MG tablet, Take 1 tablet (500 mg total) by mouth 2 (two) times daily with a meal., Disp: 30 tablet, Rfl: 11 ?  Multiple Vitamin (MULTIVITAMIN) capsule, Take 1 capsule by mouth daily., Disp: , Rfl:  ?  potassium chloride (KLOR-CON) 10 MEQ tablet, Take 1 tablet by mouth once daily, Disp: 30 tablet, Rfl: 0 ?  traMADol (ULTRAM) 50 MG tablet, Take 50 mg by mouth every 6 (six) hours as needed for moderate pain., Disp: , Rfl:   ? ?I will start the patient on metoprolol tartarate 25 mg BID, will have Dr. Terri Skains review her chart. All questions answered  ? ? ? ? ?  ICD-10-CM   ?1. Benign hypertension  I10 metoprolol tartrate (LOPRESSOR) 25 MG tablet  ?  ?2. Paroxysmal atrial fibrillation (HCC)  I48.0 PCV CARDIAC STRESS TEST  ?  metoprolol tartrate (LOPRESSOR)  25 MG tablet  ?  ? ? ?Meds ordered this encounter  ?Medications  ? metoprolol tartrate (LOPRESSOR) 25 MG tablet  ?  Sig: Take 1 tablet (25 mg total) by mouth 2 (two) times daily.  ?  Dispense:  60 tablet  ?  Refill:  2  ?  ? ?Adrian Prows, MD, [facility] ?12/24/2021, 12:57 PM ?Office: [phone] ?Fax: [phone] ?Pager: [phone]  ? ?

## 2021-12-24 NOTE — Progress Notes (Unsigned)
Patient presented here for a routine treadmill exercise stress test, however patient was in A-fib with RVR with heart rate of 160 to 170 bpm although asymptomatic. ? ?Vitals:  ? 12/24/21 1246  ?BP: 112/70  ?Pulse: (!) 172  ? ? ?Cardiac exam: Tachycardia present.  No gallop, no murmur appreciated. ?Chest clear.: ?Extremities: No edema. ? ? ?Current Outpatient Medications:  ?  albuterol (PROVENTIL) (2.5 MG/3ML) 0.083% nebulizer solution, Take 3 mLs (2.5 mg total) by nebulization every 6 (six) hours as needed for wheezing or shortness of breath., Disp: 75 mL, Rfl: 12 ?  albuterol (VENTOLIN HFA) 108 (90 Base) MCG/ACT inhaler, Inhale 1 puff into the lungs every 6 (six) hours as needed for shortness of breath or wheezing., Disp: , Rfl:  ?  apixaban (ELIQUIS) 5 MG TABS tablet, Take 1 tablet (5 mg total) by mouth 2 (two) times daily., Disp: 60 tablet, Rfl: 2 ?  atorvastatin (LIPITOR) 40 MG tablet, Take 40 mg by mouth daily., Disp: , Rfl:  ?  blood glucose meter kit and supplies KIT, Dispense based on patient and insurance preference. Use up to four times daily as directed., Disp: 1 each, Rfl: 0 ?  diltiazem (CARDIZEM CD) 180 MG 24 hr capsule, Take 1 capsule (180 mg total) by mouth daily., Disp: 30 capsule, Rfl: 3 ?  EPINEPHrine 0.3 mg/0.3 mL IJ SOAJ injection, Inject 0.3 mg into the muscle as needed for anaphylaxis., Disp: 1 each, Rfl: 1 ?  famotidine (PEPCID) 20 MG tablet, Take 20 mg by mouth 2 (two) times daily., Disp: , Rfl:  ?  fluticasone-salmeterol (ADVAIR) 100-50 MCG/ACT AEPB, Inhale 1 puff into the lungs 2 (two) times daily., Disp: , Rfl:  ?  gabapentin (NEURONTIN) 100 MG capsule, Take 100 mg by mouth 3 (three) times daily as needed (pain)., Disp: , Rfl:  ?  glimepiride (AMARYL) 1 MG tablet, Take 1 tablet (1 mg total) by mouth daily with breakfast., Disp: 30 tablet, Rfl: 11 ?  hydrochlorothiazide (HYDRODIURIL) 25 MG tablet, TAKE 1 TABLET BY MOUTH ONCE DAILY IN THE MORNING, Disp: 90 tablet, Rfl: 0 ?  lidocaine  (XYLOCAINE) 2 % solution, Use as directed 15 mLs in the mouth or throat every 4 (four) hours as needed for mouth pain., Disp: 100 mL, Rfl: 0 ?  loratadine (CLARITIN) 10 MG tablet, Take 1 tablet (10 mg total) by mouth daily. (Patient taking differently: Take 10 mg by mouth daily as needed for allergies.), Disp: 30 tablet, Rfl: 1 ?  metFORMIN (GLUCOPHAGE) 500 MG tablet, Take 1 tablet (500 mg total) by mouth 2 (two) times daily with a meal., Disp: 30 tablet, Rfl: 11 ?  metoprolol tartrate (LOPRESSOR) 25 MG tablet, Take 1 tablet (25 mg total) by mouth 2 (two) times daily., Disp: 60 tablet, Rfl: 2 ?  Multiple Vitamin (MULTIVITAMIN) capsule, Take 1 capsule by mouth daily., Disp: , Rfl:  ?  potassium chloride (KLOR-CON) 10 MEQ tablet, Take 1 tablet by mouth once daily, Disp: 30 tablet, Rfl: 0 ?  traMADol (ULTRAM) 50 MG tablet, Take 50 mg by mouth every 6 (six) hours as needed for moderate pain., Disp: , Rfl:  ?  losartan (COZAAR) 50 MG tablet, Take 1 tablet (50 mg total) by mouth daily at 10 pm., Disp: 30 tablet, Rfl: 0  ? ?I will start the patient on metoprolol tartarate 25 mg BID, will have Dr. Terri Skains review her chart. All questions answered  ? ? ? ? ?  ICD-10-CM   ?1. Benign hypertension  I10 metoprolol  tartrate (LOPRESSOR) 25 MG tablet  ?  ?2. Paroxysmal atrial fibrillation (HCC)  I48.0 PCV CARDIAC STRESS TEST  ?  metoprolol tartrate (LOPRESSOR) 25 MG tablet  ?  ? ? ?Meds ordered this encounter  ?Medications  ? metoprolol tartrate (LOPRESSOR) 25 MG tablet  ?  Sig: Take 1 tablet (25 mg total) by mouth 2 (two) times daily.  ?  Dispense:  60 tablet  ?  Refill:  2  ?  ? ?Adrian Prows, MD, [facility] ?12/24/2021, 1:00 PM ?Office: [phone] ?Fax: [phone] ?Pager: [phone]  ? ?

## 2021-12-24 NOTE — Telephone Encounter (Signed)
Patient presented here for a routine treadmill exercise stress test, however patient was in A-fib with RVR with heart rate of 160 to 170 bpm although asymptomatic. ? ?Vitals:  ? 12/24/21 1246  ?BP: 112/70  ?Pulse: (!) 172  ? ? ?Cardiac exam: Tachycardia present.  No gallop, no murmur appreciated. ?Chest clear.: ?Extremities: No edema. ? ? ?Current Outpatient Medications:  ?  albuterol (PROVENTIL) (2.5 MG/3ML) 0.083% nebulizer solution, Take 3 mLs (2.5 mg total) by nebulization every 6 (six) hours as needed for wheezing or shortness of breath., Disp: 75 mL, Rfl: 12 ?  albuterol (VENTOLIN HFA) 108 (90 Base) MCG/ACT inhaler, Inhale 1 puff into the lungs every 6 (six) hours as needed for shortness of breath or wheezing., Disp: , Rfl:  ?  apixaban (ELIQUIS) 5 MG TABS tablet, Take 1 tablet (5 mg total) by mouth 2 (two) times daily., Disp: 60 tablet, Rfl: 2 ?  atorvastatin (LIPITOR) 40 MG tablet, Take 40 mg by mouth daily., Disp: , Rfl:  ?  blood glucose meter kit and supplies KIT, Dispense based on patient and insurance preference. Use up to four times daily as directed., Disp: 1 each, Rfl: 0 ?  diltiazem (CARDIZEM CD) 180 MG 24 hr capsule, Take 1 capsule (180 mg total) by mouth daily., Disp: 30 capsule, Rfl: 3 ?  EPINEPHrine 0.3 mg/0.3 mL IJ SOAJ injection, Inject 0.3 mg into the muscle as needed for anaphylaxis., Disp: 1 each, Rfl: 1 ?  famotidine (PEPCID) 20 MG tablet, Take 20 mg by mouth 2 (two) times daily., Disp: , Rfl:  ?  fluticasone-salmeterol (ADVAIR) 100-50 MCG/ACT AEPB, Inhale 1 puff into the lungs 2 (two) times daily., Disp: , Rfl:  ?  gabapentin (NEURONTIN) 100 MG capsule, Take 100 mg by mouth 3 (three) times daily as needed (pain)., Disp: , Rfl:  ?  glimepiride (AMARYL) 1 MG tablet, Take 1 tablet (1 mg total) by mouth daily with breakfast., Disp: 30 tablet, Rfl: 11 ?  hydrochlorothiazide (HYDRODIURIL) 25 MG tablet, TAKE 1 TABLET BY MOUTH ONCE DAILY IN THE MORNING, Disp: 90 tablet, Rfl: 0 ?  lidocaine  (XYLOCAINE) 2 % solution, Use as directed 15 mLs in the mouth or throat every 4 (four) hours as needed for mouth pain., Disp: 100 mL, Rfl: 0 ?  loratadine (CLARITIN) 10 MG tablet, Take 1 tablet (10 mg total) by mouth daily. (Patient taking differently: Take 10 mg by mouth daily as needed for allergies.), Disp: 30 tablet, Rfl: 1 ?  losartan (COZAAR) 50 MG tablet, Take 1 tablet (50 mg total) by mouth daily at 10 pm., Disp: 30 tablet, Rfl: 0 ?  metFORMIN (GLUCOPHAGE) 500 MG tablet, Take 1 tablet (500 mg total) by mouth 2 (two) times daily with a meal., Disp: 30 tablet, Rfl: 11 ?  metoprolol tartrate (LOPRESSOR) 25 MG tablet, Take 1 tablet (25 mg total) by mouth 2 (two) times daily., Disp: 60 tablet, Rfl: 2 ?  Multiple Vitamin (MULTIVITAMIN) capsule, Take 1 capsule by mouth daily., Disp: , Rfl:  ?  potassium chloride (KLOR-CON) 10 MEQ tablet, Take 1 tablet by mouth once daily, Disp: 30 tablet, Rfl: 0 ?  traMADol (ULTRAM) 50 MG tablet, Take 50 mg by mouth every 6 (six) hours as needed for moderate pain., Disp: , Rfl:   ? ?I will start the patient on metoprolol tartarate 25 mg BID, will have Dr. Terri Skains review her chart. All questions answered  ? ? ? ? ?No diagnosis found. ? ? ?No orders of the defined types  were placed in this encounter. ?  ? ?Adrian Prows, MD, [facility] ?12/24/2021, 1:01 PM ?Office: [phone] ?Fax: [phone] ?Pager: [phone]  ? ?

## 2021-12-26 NOTE — Telephone Encounter (Signed)
Shary Key,  ? ?Please follow up.  ? ?I reached out to front desk to have her see be sooner (this week) and I was told she will see Celeste on the day of her stress test. But I do not see her scheduled.  ? ?Tessa Lerner, DO, FACC ? ?Pager: [phone] ?Office: [phone] ? ?

## 2021-12-28 ENCOUNTER — Encounter: Payer: Self-pay | Admitting: Student

## 2021-12-28 ENCOUNTER — Ambulatory Visit: Payer: Medicare Other

## 2021-12-28 ENCOUNTER — Ambulatory Visit: Payer: Medicare Other | Admitting: Student

## 2021-12-28 VITALS — BP 130/77 | HR 61 | Temp 98.1°F | Resp 17 | Ht 65.0 in | Wt 178.0 lb

## 2021-12-28 DIAGNOSIS — I10 Essential (primary) hypertension: Secondary | ICD-10-CM

## 2021-12-28 DIAGNOSIS — I480 Paroxysmal atrial fibrillation: Secondary | ICD-10-CM

## 2021-12-28 DIAGNOSIS — E119 Type 2 diabetes mellitus without complications: Secondary | ICD-10-CM

## 2021-12-28 MED ORDER — LOSARTAN POTASSIUM 50 MG PO TABS: 50.0000 mg | ORAL_TABLET | Freq: Every day | ORAL | 3 refills | Status: DC

## 2021-12-28 NOTE — Progress Notes (Signed)
? ?Date:  12/28/2021  ? ?ID:  [name], DOB June 09, 1951, MRN [id number] ? ?PCP:  Stann OreMartin, Cynthia, NP  ?Cardiologist:  Dr. Odis Hollingsheadolia  ? ?Chief Complaint  ?Patient presents with  ? Atrial Fibrillation  ? Abnormal ECG  ? ? ?HPI  ?[name] is a 71 y.o. African-American female who presents to the office with a chief complaint of " hospital follow-up, atrial fibrillation management." Patient's past medical history and cardiovascular risk factors include: Paroxysmal atrial fibrillation status post TEE guided cardioversion (08/2021), COPD, tobacco use, diet-controlled type II diabetes, hypertension, hyperlipidemia, rheumatoid arthritis, GERD .   ? ?Patient was initially seen in consultation during her hospitalization in November 2022 for COPD exacerbation during the hospitalization she went into A. fib with RVR which was not controlled with IV Cardizem and therefore underwent TEE guided cardioversion.  Normal sinus rhythm was restored and she was discharged on oral medications.  ? ?At follow up appointment ordered stress test given new onset a fib. However when patient presented for stress testing she was in atrial fibrillation with RVR, but asymptomatic. At that time patient was started on Lopressor 25 mg BID and stress test was rescheduled. Patient notes that at the time of this event she was being treated by PCP for COPD exacerbation with antibiotics.  ? ?Patient now presents for follow up. She is feeling well without complaints today. Denies chest pain, palpitations, dyspnea. She is tolerating oral anticoagulation well without any side effects or intolerances.  She does not endorse any evidence of bleeding. ? ?She has successfully stopped smoking as of 08/22/2021. ? ?FUNCTIONAL STATUS: ?No structured exercise program or daily routine.  ? ?ALLERGIES: ?Allergies  ?Allergen Reactions  ? Azithromycin Anaphylaxis and Swelling  ?  Eyes swell  ? Benazepril Anaphylaxis  ?  Other reaction(s): SWELLING  ? Cephalexin  Anaphylaxis and Swelling  ? Benadryl [Diphenhydramine Hcl] Nausea And Vomiting  ? Other Other (See Comments)  ?  Reports allergy to insects. Unsure of reaction.  ? Nexium [Esomeprazole Magnesium] Palpitations  ? Spiriva [Tiotropium Bromide Monohydrate] Palpitations  ? ? ?MEDICATION LIST PRIOR TO VISIT: ?Current Meds  ?Medication Sig  ? albuterol (PROVENTIL) (2.5 MG/3ML) 0.083% nebulizer solution Take 3 mLs (2.5 mg total) by nebulization every 6 (six) hours as needed for wheezing or shortness of breath.  ? albuterol (VENTOLIN HFA) 108 (90 Base) MCG/ACT inhaler Inhale 1 puff into the lungs every 6 (six) hours as needed for shortness of breath or wheezing.  ? apixaban (ELIQUIS) 5 MG TABS tablet Take 1 tablet (5 mg total) by mouth 2 (two) times daily.  ? atorvastatin (LIPITOR) 40 MG tablet Take 40 mg by mouth daily.  ? diltiazem (CARDIZEM CD) 180 MG 24 hr capsule Take 1 capsule (180 mg total) by mouth daily.  ? EPINEPHrine 0.3 mg/0.3 mL IJ SOAJ injection Inject 0.3 mg into the muscle as needed for anaphylaxis.  ? famotidine (PEPCID) 20 MG tablet Take 20 mg by mouth 2 (two) times daily.  ? fluticasone-salmeterol (ADVAIR) 100-50 MCG/ACT AEPB Inhale 1 puff into the lungs 2 (two) times daily.  ? hydrochlorothiazide (HYDRODIURIL) 25 MG tablet TAKE 1 TABLET BY MOUTH ONCE DAILY IN THE MORNING  ? lidocaine (XYLOCAINE) 2 % solution Use as directed 15 mLs in the mouth or throat every 4 (four) hours as needed for mouth pain.  ? loratadine (CLARITIN) 10 MG tablet Take 1 tablet (10 mg total) by mouth daily. (Patient taking differently: Take 10 mg by mouth daily as needed for  allergies.)  ? metFORMIN (GLUCOPHAGE) 500 MG tablet Take 1 tablet (500 mg total) by mouth 2 (two) times daily with a meal.  ? metoprolol tartrate (LOPRESSOR) 25 MG tablet Take 1 tablet (25 mg total) by mouth 2 (two) times daily.  ? potassium chloride (KLOR-CON) 10 MEQ tablet Take 1 tablet by mouth once daily  ? traMADol (ULTRAM) 50 MG tablet Take 50 mg by mouth  every 6 (six) hours as needed for moderate pain.  ?  ? ?PAST MEDICAL HISTORY: ?Past Medical History:  ?Diagnosis Date  ? Arthritis   ? Asthma   ? COPD (chronic obstructive pulmonary disease) (HCC)   ? Diabetes mellitus   ? Heart murmur   ? High cholesterol   ? Hyperlipidemia   ? Hypertension   ? Kidney infection   ? Kidney stone   ? Reflux   ? ? ?PAST SURGICAL HISTORY: ?Past Surgical History:  ?Procedure Laterality Date  ? BUBBLE STUDY  08/26/2021  ? Procedure: BUBBLE STUDY;  Surgeon: Tessa Lerner, DO;  Location: [facility];  Service: Cardiovascular;;  ? CARDIOVERSION N/A 08/26/2021  ? Procedure: CARDIOVERSION;  Surgeon: Tessa Lerner, DO;  Location: [facility];  Service: Cardiovascular;  Laterality: N/A;  ? fillopian tube removal    ? right breast cyst removal    ? TEE WITHOUT CARDIOVERSION N/A 08/26/2021  ? Procedure: TRANSESOPHAGEAL ECHOCARDIOGRAM (TEE);  Surgeon: Tessa Lerner, DO;  Location: [facility];  Service: Cardiovascular;  Laterality: N/A;  ? ? ?FAMILY HISTORY: ?The patient family history includes CVA in an other family member; Diabetes in her father; Diabetes Mellitus II in an other family member; Hypertension in her sister and another family member; Stroke in her mother. ? ?SOCIAL HISTORY:  ?The patient  reports that she quit smoking about 4 months ago. Her smoking use included cigarettes. She started smoking about 55 years ago. She has a 26.00 pack-year smoking history. Her smokeless tobacco use includes chew. She reports that she does not currently use alcohol. She reports that she does not use drugs. ? ?REVIEW OF SYSTEMS: ?Review of Systems  ?Constitutional: Negative for chills and fever.  ?HENT:  Negative for hoarse voice and nosebleeds.   ?Eyes:  Negative for discharge, double vision and pain.  ?Cardiovascular:  Negative for chest pain, claudication, dyspnea on exertion, leg swelling, near-syncope, orthopnea, palpitations, paroxysmal nocturnal dyspnea and syncope.  ?Respiratory:  Negative for  hemoptysis and shortness of breath.   ?Musculoskeletal:  Negative for muscle cramps and myalgias.  ?Gastrointestinal:  Negative for abdominal pain, constipation, diarrhea, hematemesis, hematochezia, melena, nausea and vomiting.  ?Neurological:  Negative for dizziness and light-headedness.  ? ?PHYSICAL EXAM: ? ?  12/28/2021  ? 11:05 AM 12/24/2021  ? 12:46 PM 10/07/2021  ?  3:32 PM  ?Vitals with BMI  ?Height     ?Weight 178 lbs  190 lbs 3 oz  ?BMI 29.62  31.65  ?Systolic 130 112 161  ?Diastolic 77 70 81  ?Pulse 61 172 60  ? ? ?CONSTITUTIONAL: Well-developed and well-nourished. No acute distress.  ?SKIN: Skin is warm and dry. No rash noted. No cyanosis. No pallor. No jaundice ?HEAD: Normocephalic and atraumatic.  ?EYES: No scleral icterus ?MOUTH/THROAT: Moist oral membranes.  ?NECK: No JVD present. No thyromegaly noted. No carotid bruits  ?LYMPHATIC: No visible cervical adenopathy.  ?CHEST Normal respiratory effort. No intercostal retractions  ?LUNGS: Clear to auscultation bilaterally.  No stridor. No wheezes. No rales.  ?CARDIOVASCULAR: Regular rate and rhythm, positive S1-S2, no murmurs rubs or gallops  appreciated. ?ABDOMINAL: Obese, soft, nontender, nondistended, positive bowel sounds all 4 quadrants no apparent ascites.  ?EXTREMITIES: No peripheral edema, warm to touch, 2+ bilateral DP and PT pulses ?HEMATOLOGIC: No significant bruising ?NEUROLOGIC: Oriented to person, place, and time. Nonfocal. Normal muscle tone.  ?PSYCHIATRIC: Normal mood and affect. Normal behavior. Cooperative ? ?CARDIAC DATABASE: ?EKG: ?10/07/2021: Normal sinus rhythm, 60 bpm, without underlying ischemia or injury pattern. ? 12/28/2021: Sinus rhythm at a rate of 50 bpm ? ? ?Echocardiogram: ?08/23/2021: ?LVEF 55-60%, normal wall motion, right ventricular size and function normal, trivial MR, global longitudinal strain -20.6% ?  ?Stress Testing: ?No results found for this or any previous visit from the past 1095 days. ? ? ?Heart  Catheterization: ?None ? ?TEE guided cardioversion: ?08/26/2021: 200 J x 1 restored to sinus rhythm ? ?LABORATORY DATA: ? ?  Latest Ref Rng & Units 08/27/2021  ?  2:30 AM 08/26/2021  ?  2:28 AM 08/25/2021  ?  2:04 AM  ?CBC  ?

## 2021-12-30 NOTE — Progress Notes (Signed)
Called pt to inform her about her test tried to transfer to the from but call got disconnected. Can someone give her a call back please

## 2022-01-14 NOTE — Progress Notes (Signed)
Send the chart to the front

## 2022-01-15 ENCOUNTER — Other Ambulatory Visit: Payer: Self-pay | Admitting: Cardiology

## 2022-01-18 ENCOUNTER — Other Ambulatory Visit: Payer: Self-pay | Admitting: Cardiology

## 2022-01-18 DIAGNOSIS — I10 Essential (primary) hypertension: Secondary | ICD-10-CM

## 2022-03-07 ENCOUNTER — Other Ambulatory Visit: Payer: Self-pay | Admitting: Cardiology

## 2022-03-19 ENCOUNTER — Other Ambulatory Visit: Payer: Self-pay | Admitting: Cardiology

## 2022-03-19 DIAGNOSIS — I480 Paroxysmal atrial fibrillation: Secondary | ICD-10-CM

## 2022-03-19 DIAGNOSIS — I10 Essential (primary) hypertension: Secondary | ICD-10-CM

## 2022-04-09 ENCOUNTER — Other Ambulatory Visit: Payer: Self-pay | Admitting: Cardiology

## 2022-04-27 ENCOUNTER — Other Ambulatory Visit: Payer: Self-pay | Admitting: Cardiology

## 2022-04-27 DIAGNOSIS — I480 Paroxysmal atrial fibrillation: Secondary | ICD-10-CM

## 2022-04-27 DIAGNOSIS — I10 Essential (primary) hypertension: Secondary | ICD-10-CM

## 2022-06-03 ENCOUNTER — Other Ambulatory Visit: Payer: Self-pay | Admitting: Cardiology

## 2022-06-03 DIAGNOSIS — I10 Essential (primary) hypertension: Secondary | ICD-10-CM

## 2022-06-07 ENCOUNTER — Other Ambulatory Visit: Payer: Self-pay

## 2022-06-07 DIAGNOSIS — I10 Essential (primary) hypertension: Secondary | ICD-10-CM

## 2022-06-07 MED ORDER — LOSARTAN POTASSIUM 50 MG PO TABS: 50.0000 mg | ORAL_TABLET | Freq: Every day | ORAL | 3 refills | Status: DC

## 2022-06-11 ENCOUNTER — Encounter (HOSPITAL_BASED_OUTPATIENT_CLINIC_OR_DEPARTMENT_OTHER): Payer: Self-pay | Admitting: Emergency Medicine

## 2022-06-11 ENCOUNTER — Emergency Department (HOSPITAL_BASED_OUTPATIENT_CLINIC_OR_DEPARTMENT_OTHER): Payer: Medicare Other

## 2022-06-11 ENCOUNTER — Emergency Department (HOSPITAL_BASED_OUTPATIENT_CLINIC_OR_DEPARTMENT_OTHER)
Admission: EM | Admit: 2022-06-11 | Discharge: 2022-06-12 | Disposition: A | Discharge status: Home / Self Care | Payer: Medicare Other | Attending: Emergency Medicine | Admitting: Emergency Medicine

## 2022-06-11 ENCOUNTER — Other Ambulatory Visit: Payer: Self-pay

## 2022-06-11 DIAGNOSIS — I10 Essential (primary) hypertension: Secondary | ICD-10-CM | POA: Diagnosis not present

## 2022-06-11 DIAGNOSIS — R748 Abnormal levels of other serum enzymes: Secondary | ICD-10-CM | POA: Diagnosis not present

## 2022-06-11 DIAGNOSIS — E119 Type 2 diabetes mellitus without complications: Secondary | ICD-10-CM | POA: Diagnosis not present

## 2022-06-11 DIAGNOSIS — J449 Chronic obstructive pulmonary disease, unspecified: Secondary | ICD-10-CM | POA: Diagnosis not present

## 2022-06-11 DIAGNOSIS — Z7901 Long term (current) use of anticoagulants: Secondary | ICD-10-CM | POA: Diagnosis not present

## 2022-06-11 DIAGNOSIS — R1011 Right upper quadrant pain: Secondary | ICD-10-CM | POA: Insufficient documentation

## 2022-06-11 LAB — CBC WITH DIFFERENTIAL/PLATELET
Abs Immature Granulocytes: 0.04 10*3/uL (ref 0.00–0.07)
Basophils Absolute: 0.1 10*3/uL (ref 0.0–0.1)
Basophils Relative: 1 %
Eosinophils Absolute: 0.2 10*3/uL (ref 0.0–0.5)
Eosinophils Relative: 2 %
HCT: 33.9 % — ABNORMAL LOW (ref 36.0–46.0)
Hemoglobin: 11 g/dL — ABNORMAL LOW (ref 12.0–15.0)
Immature Granulocytes: 0 %
Lymphocytes Relative: 35 %
Lymphs Abs: 3.5 10*3/uL (ref 0.7–4.0)
MCH: 27.4 pg (ref 26.0–34.0)
MCHC: 32.4 g/dL (ref 30.0–36.0)
MCV: 84.5 fL (ref 80.0–100.0)
Monocytes Absolute: 0.7 10*3/uL (ref 0.1–1.0)
Monocytes Relative: 7 %
Neutro Abs: 5.5 10*3/uL (ref 1.7–7.7)
Neutrophils Relative %: 55 %
Platelets: 219 10*3/uL (ref 150–400)
RBC: 4.01 MIL/uL (ref 3.87–5.11)
RDW: 14.5 % (ref 11.5–15.5)
WBC: 9.9 10*3/uL (ref 4.0–10.5)
nRBC: 0 % (ref 0.0–0.2)

## 2022-06-11 LAB — COMPREHENSIVE METABOLIC PANEL
ALT: 8 U/L (ref 0–44)
AST: 13 U/L — ABNORMAL LOW (ref 15–41)
Albumin: 3.6 g/dL (ref 3.5–5.0)
Alkaline Phosphatase: 48 U/L (ref 38–126)
Anion gap: 7 (ref 5–15)
BUN: 21 mg/dL (ref 8–23)
CO2: 31 mmol/L (ref 22–32)
Calcium: 9.2 mg/dL (ref 8.9–10.3)
Chloride: 97 mmol/L — ABNORMAL LOW (ref 98–111)
Creatinine, Ser: 0.98 mg/dL (ref 0.44–1.00)
GFR, Estimated: >60 mL/min (ref >60)
Glucose, Bld: 84 mg/dL (ref 70–99)
Potassium: 3.4 mmol/L — ABNORMAL LOW (ref 3.5–5.1)
Sodium: 135 mmol/L (ref 135–145)
Total Bilirubin: 0.6 mg/dL (ref 0.3–1.2)
Total Protein: 6.5 g/dL (ref 6.5–8.1)

## 2022-06-11 LAB — URINALYSIS, ROUTINE W REFLEX MICROSCOPIC
Bilirubin Urine: NEGATIVE
Glucose, UA: NEGATIVE mg/dL
Ketones, ur: NEGATIVE mg/dL
Nitrite: NEGATIVE
Protein, ur: NEGATIVE mg/dL
Specific Gravity, Urine: 1.015 (ref 1.005–1.030)
pH: 7 (ref 5.0–8.0)

## 2022-06-11 LAB — URINALYSIS, MICROSCOPIC (REFLEX)

## 2022-06-11 LAB — TROPONIN I (HIGH SENSITIVITY): Troponin I (High Sensitivity): 5 ng/L (ref <18)

## 2022-06-11 LAB — LIPASE, BLOOD: Lipase: 88 U/L — ABNORMAL HIGH (ref 11–51)

## 2022-06-11 MED ORDER — OXYCODONE-ACETAMINOPHEN 5-325 MG PO TABS
1.0000 | ORAL_TABLET | ORAL | Status: DC | PRN
  Administered 2022-06-11: 1 via ORAL
  Filled 2022-06-11: qty 1

## 2022-06-11 MED ORDER — IOHEXOL 300 MG/ML  SOLN
100.0000 mL | Freq: Once | INTRAMUSCULAR | Status: AC | PRN
  Administered 2022-06-11: 100 mL via INTRAVENOUS

## 2022-06-11 NOTE — ED Triage Notes (Signed)
Reports right flank pain since early in the week.  Was seen by PCP and told she has a kidney stone.  Sent home with script for flomax.  Reports no relief of pain.  Is not taking anything else for pain.

## 2022-06-11 NOTE — ED Provider Notes (Addendum)
[facility] DEPARTMENT Provider Note   CSN: [id number] Arrival date & time: 06/11/22  1936     History  Chief Complaint  Patient presents with   Flank Pain    [name] is a 71 y.o. female.  Patient with complaint of right-sided abdominal pain predominantly right upper quadrant and radiates from the back to the front.  No true flank pain.  Patient not able to determine whether it feels like a kidney stone or not.  Patient known to have a benign left renal mass had recent MRI June 29 of this year.  Patient states that this pain has been present for about a week not associated with nausea or vomiting.  Regular doctor did put her on Flomax.  Thinking that this could be a kidney stone.  Past medical history significant otherwise for hypertension diabetes high cholesterol COPD hyperlipidemia.  Past surgical history fallopian tube removal former smoker quit in 2022.       Home Medications Prior to Admission medications   Medication Sig Start Date End Date Taking? Authorizing Provider  albuterol (PROVENTIL) (2.5 MG/3ML) 0.083% nebulizer solution Take 3 mLs (2.5 mg total) by nebulization every 6 (six) hours as needed for wheezing or shortness of breath. 08/17/16   Mesner, Corene Cornea, MD  albuterol (VENTOLIN HFA) 108 (90 Base) MCG/ACT inhaler Inhale 1 puff into the lungs every 6 (six) hours as needed for shortness of breath or wheezing. 06/22/21   [provider]  apixaban (ELIQUIS) 5 MG TABS tablet Take 1 tablet (5 mg total) by mouth 2 (two) times daily. 08/27/21   Oswald Hillock, MD  atorvastatin (LIPITOR) 40 MG tablet Take 40 mg by mouth daily. 05/17/17   [provider]  blood glucose meter kit and supplies KIT Dispense based on patient and insurance preference. Use up to four times daily as directed. 08/27/21   Oswald Hillock, MD  diltiazem (CARDIZEM CD) 180 MG 24 hr capsule Take 1 capsule (180 mg total) by mouth daily. 08/28/21   Oswald Hillock, MD   EPINEPHrine 0.3 mg/0.3 mL IJ SOAJ injection Inject 0.3 mg into the muscle as needed for anaphylaxis. 03/21/21   Ezekiel Slocumb, DO  famotidine (PEPCID) 20 MG tablet Take 20 mg by mouth 2 (two) times daily. 02/25/21   [provider]  fluticasone-salmeterol (ADVAIR) 100-50 MCG/ACT AEPB Inhale 1 puff into the lungs 2 (two) times daily. 03/12/21   [provider]  gabapentin (NEURONTIN) 100 MG capsule Take 100 mg by mouth 3 (three) times daily as needed (pain). Patient not taking: Reported on 12/28/2021 03/12/21   [provider]  hydrochlorothiazide (HYDRODIURIL) 25 MG tablet TAKE 1 TABLET BY MOUTH ONCE DAILY IN THE MORNING 06/03/22   Tolia, Sunit, DO  lidocaine (XYLOCAINE) 2 % solution Use as directed 15 mLs in the mouth or throat every 4 (four) hours as needed for mouth pain. 03/21/21   Ezekiel Slocumb, DO  loratadine (CLARITIN) 10 MG tablet Take 1 tablet (10 mg total) by mouth daily. Patient taking differently: Take 10 mg by mouth daily as needed for allergies. 03/21/21   Ezekiel Slocumb, DO  losartan (COZAAR) 50 MG tablet Take 1 tablet (50 mg total) by mouth daily at 10 pm. 06/07/22 06/02/23  Terri Skains, Sunit, DO  metFORMIN (GLUCOPHAGE) 500 MG tablet Take 1 tablet (500 mg total) by mouth 2 (two) times daily with a meal. 08/27/21 02/23/22  Oswald Hillock, MD  metoprolol tartrate (LOPRESSOR) 25 MG tablet  Take 1 tablet by mouth twice daily 04/27/22   Tolia, Sunit, DO  potassium chloride (KLOR-CON) 10 MEQ tablet Take 1 tablet by mouth once daily 04/11/22   Tolia, Sunit, DO  traMADol (ULTRAM) 50 MG tablet Take 50 mg by mouth every 6 (six) hours as needed for moderate pain. 03/12/21   [provider]  ranitidine (ZANTAC) 150 MG tablet Take 1 tablet (150 mg total) by mouth 2 (two) times daily. Patient not taking: No sig reported 06/25/18 10/04/20  Parrett, Fonnie Mu, NP      Allergies    Azithromycin, Benazepril, Cephalexin, Benadryl [diphenhydramine hcl], Other, Nexium [esomeprazole  magnesium], and Spiriva [tiotropium bromide monohydrate]    Review of Systems   Review of Systems  Constitutional:  Negative for chills and fever.  HENT:  Negative for ear pain and sore throat.   Eyes:  Negative for pain and visual disturbance.  Respiratory:  Negative for cough and shortness of breath.   Cardiovascular:  Negative for chest pain and palpitations.  Gastrointestinal:  Positive for abdominal pain. Negative for vomiting.  Genitourinary:  Negative for dysuria and hematuria.  Musculoskeletal:  Positive for back pain. Negative for arthralgias.  Skin:  Negative for color change and rash.  Neurological:  Negative for seizures and syncope.  All other systems reviewed and are negative.   Physical Exam Updated Vital Signs BP 121/84   Pulse (!) 58   Temp 97.8 F (36.6 C) (Oral)   Resp 16   Ht 1.651 m (_0 )   Wt 80.7 kg   SpO2 96%   BMI 29.61 kg/m  Physical Exam Vitals and nursing note reviewed.  Constitutional:      General: She is not in acute distress.    Appearance: Normal appearance. She is well-developed.  HENT:     Head: Normocephalic and atraumatic.  Eyes:     Extraocular Movements: Extraocular movements intact.     Conjunctiva/sclera: Conjunctivae normal.     Pupils: Pupils are equal, round, and reactive to light.  Cardiovascular:     Rate and Rhythm: Normal rate and regular rhythm.     Heart sounds: No murmur heard. Pulmonary:     Effort: Pulmonary effort is normal. No respiratory distress.     Breath sounds: Normal breath sounds.  Abdominal:     General: There is no distension.     Palpations: Abdomen is soft.     Tenderness: There is abdominal tenderness. There is no guarding.     Comments: Mild tenderness right upper quadrant.  Musculoskeletal:        General: No swelling.     Cervical back: Normal range of motion and neck supple.  Skin:    General: Skin is warm and dry.     Capillary Refill: Capillary refill takes less than 2 seconds.   Neurological:     General: No focal deficit present.     Mental Status: She is alert and oriented to person, place, and time.  Psychiatric:        Mood and Affect: Mood normal.     ED Results / Procedures / Treatments   Labs (all labs ordered are listed, but only abnormal results are displayed) Labs Reviewed  URINALYSIS, ROUTINE W REFLEX MICROSCOPIC - Abnormal; Notable for the following components:      Result Value   Hgb urine dipstick SMALL (*)    Leukocytes,Ua TRACE (*)    All other components within normal limits  URINALYSIS, MICROSCOPIC (REFLEX) - Abnormal; Notable for  the following components:   Bacteria, UA FEW (*)    All other components within normal limits  LIPASE, BLOOD - Abnormal; Notable for the following components:   Lipase 88 (*)    All other components within normal limits  COMPREHENSIVE METABOLIC PANEL - Abnormal; Notable for the following components:   Potassium 3.4 (*)    Chloride 97 (*)    AST 13 (*)    All other components within normal limits  CBC WITH DIFFERENTIAL/PLATELET - Abnormal; Notable for the following components:   Hemoglobin 11.0 (*)    HCT 33.9 (*)    All other components within normal limits  TROPONIN I (HIGH SENSITIVITY)    EKG EKG Interpretation  Date/Time:  Saturday June 11 2022 22:47:57 EDT Ventricular Rate:  50 PR Interval:  166 QRS Duration: 88 QT Interval:  462 QTC Calculation: 422 R Axis:   49 Text Interpretation: Sinus rhythm Confirmed by Fredia Sorrow [id number] on 06/11/2022 11:07:44 PM  Radiology No results found.  Procedures Procedures    Medications Ordered in ED Medications  oxyCODONE-acetaminophen (PERCOCET/ROXICET) 5-325 MG per tablet 1 tablet (1 tablet Oral Given 06/11/22 1958)    ED Course/ Medical Decision Making/ A&P                           Medical Decision Making Amount and/or Complexity of Data Reviewed Labs: ordered. Radiology: ordered.  Risk Prescription drug  management.   Patient with tenderness to palpation right upper quadrant.  Has not had her gallbladder removed.  Possibilities include acute cholecystitis or biliary colic also include urinary tract infection renal stone or renal colic.  We will get CBC lipase complete metabolic panel regular CT abdomen and pelvis with contrast.   CT scan abdomen pelvis pending.  Labs lipase elevated at 88 we will see if the CT scan shows any evidence of inflammation of the pancreas but most of her pain complaints are right upper quadrant.  Complete metabolic panel potassium down a little bit at 3.4 renal function normal liver function tests normal alk phos normal total bili normal.  CBC no leukocytosis hemoglobin 11.  Urinalysis few bacteria but not consistent with urinary tract infection.  Patient ended up having a troponin ordered because had some left anterior chest pain when she was there patient states this has been going on for over a week her doctor was recently monitoring her heart to figure out the cause of this.  This last the last than 5 minutes when it occurs.  So I think the single troponin if negative will be appropriate to rule her out based on this 1 week history.  Today's episode again was less than 15 minutes.  No symptoms currently.  EKG was a sinus bradycardia.  No acute changes.  If CT scan negative for stone or any distinct evidence of pancreatitis or any acute gallbladder findings patient can be discharged home with symptomatic treatment.  Initial troponin is normal.  Delta troponin not needed.  CT scan results pending.  Final Clinical Impression(s) / ED Diagnoses Final diagnoses:  Right upper quadrant abdominal pain    Rx / DC Orders ED Discharge Orders     None         Fredia Sorrow, MD 06/11/22 7169    Fredia Sorrow, MD 06/12/22 0000

## 2022-06-11 NOTE — Discharge Instructions (Addendum)
Work-up without any acute findings.  Make an appointment follow-up with your primary care doctor.  Take the pain medicine as directed.

## 2022-06-12 MED ORDER — TRAMADOL HCL 50 MG PO TABS: 50.0000 mg | ORAL_TABLET | Freq: Four times a day (QID) | ORAL | 0 refills | Status: DC | PRN

## 2022-06-12 NOTE — ED Notes (Signed)
Pt agreeable with d/c plan as discussed by provider- this nurse has verbally reinforced d/c instructions and provided pt with written copy- pt acknowledges verbal understanding and denies any addl questions concerns needs- pt ambulatory at d/c independently with steady gait; vitals stable; no distress  

## 2022-07-02 NOTE — Progress Notes (Unsigned)
Synopsis: Referred in October 2023 for for chest and back discomfort, patient has suspicion it is related to her lungs.  Previously seen by Tammy Parrett in [facility] office for asthma and bronchitis.    Subjective:   PATIENT ID: [name] GENDER: female DOB: August 29, 1951, MRN: [id number]   HPI  Chief Complaint  Patient presents with   Consult    Nothing has been found with cardio wants to know if something is wrong with lungs   Kimber says that lately she has been having some heart trouble and was diagnosed with atrial fibrillation.  She wore a heart monitor and she was told that cardiology found that her heart rhythm was normal. She says that she has a "funny feeling" in her left chest that feels like "fluid moving through a channel" she worries that it feels like it feels like there is a blockage in her chest.  It's not pain, but it is uncomfortable.  It radiates from her left sternum up to her left shoulder.  She has not been feeling more short of breath, no coughing more than normal.  She has noticed sever pain in her right lower quadrant that was a sharp pain that felt like "razor blades" sticking in her skin.  She doesn't cough up mucus.  She still smokes cigarettes, she is weaning herself.    She has some exertional dyspnea on exertion which has been stable since 2015.  Typically coming in the house carrying in groceries.     Record review: April 2023 cardiology notes reviewed where the patient was seen for paroxysmal A-fib, has had a TEE cardioversion.  Remains on full dose anticoagulation.  Notes indicate that she was hospitalized in late 2022 for a COPD exacerbation  Past Medical History:  Diagnosis Date   Arthritis    Asthma    COPD (chronic obstructive pulmonary disease) ([place])    Diabetes mellitus    Heart murmur    High cholesterol    Hyperlipidemia    Hypertension    Kidney infection    Kidney stone    Reflux      Family History  Problem Relation Age of Onset    Stroke Mother    Diabetes Father    Hypertension Sister    CVA Other    Hypertension Other    Diabetes Mellitus II Other      Social History   Socioeconomic History   Marital status: Single    Spouse name: Not on file   Number of children: Not on file   Years of education: Not on file   Highest education level: Not on file  Occupational History   Not on file  Tobacco Use   Smoking status: Some Days    Packs/day: 0.50    Years: 52.00    Total pack years: 26.00    Types: Cigarettes    Start date: 09/26/1966    Last attempt to quit: 08/2021    Years since quitting: 0.8   Smokeless tobacco: Current    Types: Chew   Tobacco comments:    Started smoking at age 34.  Currently smoking 2-3 cig per day  Vaping Use   Vaping Use: Never used  Substance and Sexual Activity   Alcohol use: Not Currently    Comment: Socially only   Drug use: No   Sexual activity: Not on file  Other Topics Concern   Not on file  Social History Narrative   Not on file  Social Determinants of Health   Financial Resource Strain: Not on file  Food Insecurity: Not on file  Transportation Needs: Not on file  Physical Activity: Not on file  Stress: Not on file  Social Connections: Not on file  Intimate Partner Violence: Not on file     Allergies  Allergen Reactions   Azithromycin Anaphylaxis and Swelling    Eyes swell   Benazepril Anaphylaxis    Other reaction(s): SWELLING   Cephalexin Anaphylaxis and Swelling   Benadryl [Diphenhydramine Hcl] Nausea And Vomiting   Other Other (See Comments)    Reports allergy to insects. Unsure of reaction.   Nexium [Esomeprazole Magnesium] Palpitations   Spiriva [Tiotropium Bromide Monohydrate] Palpitations     Outpatient Medications Prior to Visit  Medication Sig Dispense Refill   albuterol (PROVENTIL) (2.5 MG/3ML) 0.083% nebulizer solution Take 3 mLs (2.5 mg total) by nebulization every 6 (six) hours as needed for wheezing or shortness of breath. 75 mL  12   albuterol (VENTOLIN HFA) 108 (90 Base) MCG/ACT inhaler Inhale 1 puff into the lungs every 6 (six) hours as needed for shortness of breath or wheezing.     apixaban (ELIQUIS) 5 MG TABS tablet Take 1 tablet (5 mg total) by mouth 2 (two) times daily. 60 tablet 2   atorvastatin (LIPITOR) 40 MG tablet Take 40 mg by mouth daily.     blood glucose meter kit and supplies KIT Dispense based on patient and insurance preference. Use up to four times daily as directed. 1 each 0   diltiazem (CARDIZEM CD) 180 MG 24 hr capsule Take 1 capsule (180 mg total) by mouth daily. 30 capsule 3   EPINEPHrine 0.3 mg/0.3 mL IJ SOAJ injection Inject 0.3 mg into the muscle as needed for anaphylaxis. 1 each 1   fluticasone-salmeterol (ADVAIR) 100-50 MCG/ACT AEPB Inhale 1 puff into the lungs 2 (two) times daily.     hydrochlorothiazide (HYDRODIURIL) 25 MG tablet TAKE 1 TABLET BY MOUTH ONCE DAILY IN THE MORNING 90 tablet 0   ibuprofen (ADVIL) 200 MG tablet Take 200 mg by mouth every 6 (six) hours as needed.     lidocaine (XYLOCAINE) 2 % solution Use as directed 15 mLs in the mouth or throat every 4 (four) hours as needed for mouth pain. 100 mL 0   losartan (COZAAR) 50 MG tablet Take 1 tablet (50 mg total) by mouth daily at 10 pm. 90 tablet 3   metFORMIN (GLUCOPHAGE) 500 MG tablet Take 1 tablet (500 mg total) by mouth 2 (two) times daily with a meal. 30 tablet 11   metoprolol tartrate (LOPRESSOR) 25 MG tablet Take 1 tablet by mouth twice daily 60 tablet 0   pantoprazole (PROTONIX) 40 MG tablet Take 40 mg by mouth 2 (two) times daily.     potassium chloride (KLOR-CON) 10 MEQ tablet Take 1 tablet by mouth once daily 30 tablet 0   gabapentin (NEURONTIN) 100 MG capsule Take 100 mg by mouth 3 (three) times daily as needed (pain). (Patient not taking: Reported on 12/28/2021)     loratadine (CLARITIN) 10 MG tablet Take 1 tablet (10 mg total) by mouth daily. (Patient not taking: Reported on 07/05/2022) 30 tablet 1   No  facility-administered medications prior to visit.    Review of Systems  Constitutional:  Negative for chills, fever, malaise/fatigue and weight loss.  HENT:  Negative for congestion, nosebleeds, sinus pain and sore throat.   Eyes:  Negative for photophobia, pain and discharge.  Respiratory:  Positive for  shortness of breath. Negative for cough, hemoptysis, sputum production and wheezing.   Cardiovascular:  Negative for chest pain, palpitations, orthopnea and leg swelling.  Gastrointestinal:  Negative for abdominal pain, constipation, diarrhea, nausea and vomiting.  Genitourinary:  Negative for dysuria, frequency, hematuria and urgency.  Musculoskeletal:  Negative for back pain, joint pain, myalgias and neck pain.  Skin:  Negative for itching and rash.  Neurological:  Negative for tingling, tremors, sensory change, speech change, focal weakness, seizures, weakness and headaches.  Psychiatric/Behavioral:  Negative for memory loss, substance abuse and suicidal ideas. The patient is not nervous/anxious.       Objective:  Physical Exam   Vitals:   07/05/22 1423  BP: 110/68  Pulse: (!) 57  Temp: 97.8 F (36.6 C)  TempSrc: Oral  SpO2: 98%  Weight: 179 lb (81.2 kg)  Height: 5' 4"  (1.626 m)   Walked 500 feet on room air and O2 saturation dropped as low as 88%  Gen: well appearing, no acute distress HENT: NCAT, OP clear, neck supple without masses Eyes: PERRL, EOMi Lymph: no cervical lymphadenopathy PULM: CTA B CV: RRR, no mgr, no JVD GI: BS+, soft, nontender, no hsm Derm: no rash or skin breakdown MSK: normal bulk and tone Neuro: A&Ox4, CN II-XII intact, strength 5/5 in all 4 extremities Psyche: normal mood and affect   CBC    Component Value Date/Time   WBC 9.9 06/11/2022 2235   RBC 4.01 06/11/2022 2235   HGB 11.0 (L) 06/11/2022 2235   HCT 33.9 (L) 06/11/2022 2235   PLT 219 06/11/2022 2235   MCV 84.5 06/11/2022 2235   MCH 27.4 06/11/2022 2235   MCHC 32.4 06/11/2022  2235   RDW 14.5 06/11/2022 2235   LYMPHSABS 3.5 06/11/2022 2235   MONOABS 0.7 06/11/2022 2235   EOSABS 0.2 06/11/2022 2235   BASOSABS 0.1 06/11/2022 2235     Chest imaging: 2022 portable chest x-ray images independently reviewed showing normal pulmonary parenchyma, normal cardiac silhouette July 2023 CT angiogram chest report read: "1. No pulmonary embolism. 2. Moderate multi-vessel coronary artery calcification. 3. Morphologic changes in keeping with pulmonary arterial hypertension. 4. Mild emphysema. Bronchial wall thickening in keeping with airway inflammation."  PFT: October 2023 spirometry ratio 59%, FEV1 0.60 L 32% predicted  Labs: September 2023 hemoglobin 11 g/dL, eosinophil 200 cells per microliter  Path:  Echo:  Heart Catheterization:       Assessment & Plan:   Centrilobular emphysema ([place]) - Plan: Spirometry with graph  WHO GROUP 3 Pulmonary Hypertension  Discussion: While I am not sure that I have an answer for Larissa's chief complaint (abnormal feeling in her left chest), we have identified worsening lung function testing and now new severe emphysema/COPD.  This may certainly contribute to the symptoms she is experiencing.  However, I think it is important to recognize that she now has severe COPD to encourage her further to stop smoking.  We talked about this at length today.  Plan: Severe COPD: Stop Advair Start Trelegy 1 puff daily Stop albuterol nebulizer as needed, exchange for DuoNeb.  Use that every 6 hours as needed for chest tightness wheezing or shortness of breath Stay physically active COVID booster ASAP  Cigarette smoking: I encourage you to stop smoking as soon as possible Call 1-800-quit NOW for free nicotine replacement and counseling from the state  Pulmonary hypertension: This term means that the blood pressure in your lungs is increased We will ask for a copy of the echocardiogram recently performed to  assess this further  We will  see you back in 3 to 4 weeks to see how you are doing on the Trelegy  Immunizations: Immunization History  Administered Date(s) Administered   Fluad Quad(high Dose 65+) 06/28/2022   Influenza Split 06/26/2012, 08/19/2013, 07/05/2014, 07/26/2016   Influenza, High Dose Seasonal PF 07/26/2018, 07/29/2019   Influenza,inj,Quad PF,6+ Mos 07/16/2015, 07/26/2016, 07/10/2017   Influenza-Unspecified 06/26/2012, 08/19/2013, 07/05/2014, 07/26/2016   Pneumococcal Conjugate-13 01/22/2015     Current Outpatient Medications:    albuterol (PROVENTIL) (2.5 MG/3ML) 0.083% nebulizer solution, Take 3 mLs (2.5 mg total) by nebulization every 6 (six) hours as needed for wheezing or shortness of breath., Disp: 75 mL, Rfl: 12   albuterol (VENTOLIN HFA) 108 (90 Base) MCG/ACT inhaler, Inhale 1 puff into the lungs every 6 (six) hours as needed for shortness of breath or wheezing., Disp: , Rfl:    apixaban (ELIQUIS) 5 MG TABS tablet, Take 1 tablet (5 mg total) by mouth 2 (two) times daily., Disp: 60 tablet, Rfl: 2   atorvastatin (LIPITOR) 40 MG tablet, Take 40 mg by mouth daily., Disp: , Rfl:    blood glucose meter kit and supplies KIT, Dispense based on patient and insurance preference. Use up to four times daily as directed., Disp: 1 each, Rfl: 0   diltiazem (CARDIZEM CD) 180 MG 24 hr capsule, Take 1 capsule (180 mg total) by mouth daily., Disp: 30 capsule, Rfl: 3   EPINEPHrine 0.3 mg/0.3 mL IJ SOAJ injection, Inject 0.3 mg into the muscle as needed for anaphylaxis., Disp: 1 each, Rfl: 1   fluticasone-salmeterol (ADVAIR) 100-50 MCG/ACT AEPB, Inhale 1 puff into the lungs 2 (two) times daily., Disp: , Rfl:    hydrochlorothiazide (HYDRODIURIL) 25 MG tablet, TAKE 1 TABLET BY MOUTH ONCE DAILY IN THE MORNING, Disp: 90 tablet, Rfl: 0   ibuprofen (ADVIL) 200 MG tablet, Take 200 mg by mouth every 6 (six) hours as needed., Disp: , Rfl:    lidocaine (XYLOCAINE) 2 % solution, Use as directed 15 mLs in the mouth or throat every 4  (four) hours as needed for mouth pain., Disp: 100 mL, Rfl: 0   losartan (COZAAR) 50 MG tablet, Take 1 tablet (50 mg total) by mouth daily at 10 pm., Disp: 90 tablet, Rfl: 3   metFORMIN (GLUCOPHAGE) 500 MG tablet, Take 1 tablet (500 mg total) by mouth 2 (two) times daily with a meal., Disp: 30 tablet, Rfl: 11   metoprolol tartrate (LOPRESSOR) 25 MG tablet, Take 1 tablet by mouth twice daily, Disp: 60 tablet, Rfl: 0   pantoprazole (PROTONIX) 40 MG tablet, Take 40 mg by mouth 2 (two) times daily., Disp: , Rfl:    potassium chloride (KLOR-CON) 10 MEQ tablet, Take 1 tablet by mouth once daily, Disp: 30 tablet, Rfl: 0   gabapentin (NEURONTIN) 100 MG capsule, Take 100 mg by mouth 3 (three) times daily as needed (pain). (Patient not taking: Reported on 12/28/2021), Disp: , Rfl:    loratadine (CLARITIN) 10 MG tablet, Take 1 tablet (10 mg total) by mouth daily. (Patient not taking: Reported on 07/05/2022), Disp: 30 tablet, Rfl: 1

## 2022-07-05 ENCOUNTER — Encounter: Payer: Self-pay | Admitting: Cardiology

## 2022-07-05 ENCOUNTER — Ambulatory Visit: Payer: Medicare Other | Admitting: Cardiology

## 2022-07-05 ENCOUNTER — Encounter: Payer: Self-pay | Admitting: Pulmonary Disease

## 2022-07-05 ENCOUNTER — Ambulatory Visit (INDEPENDENT_AMBULATORY_CARE_PROVIDER_SITE_OTHER): Payer: Medicare Other | Admitting: Pulmonary Disease

## 2022-07-05 VITALS — BP 130/64 | HR 59 | Temp 97.0°F | Resp 16 | Ht 65.0 in | Wt 178.2 lb

## 2022-07-05 VITALS — BP 110/68 | HR 57 | Temp 97.8°F | Ht 64.0 in | Wt 179.0 lb

## 2022-07-05 DIAGNOSIS — Z9289 Personal history of other medical treatment: Secondary | ICD-10-CM

## 2022-07-05 DIAGNOSIS — I10 Essential (primary) hypertension: Secondary | ICD-10-CM

## 2022-07-05 DIAGNOSIS — I2729 Other secondary pulmonary hypertension: Secondary | ICD-10-CM

## 2022-07-05 DIAGNOSIS — E119 Type 2 diabetes mellitus without complications: Secondary | ICD-10-CM

## 2022-07-05 DIAGNOSIS — J449 Chronic obstructive pulmonary disease, unspecified: Secondary | ICD-10-CM

## 2022-07-05 DIAGNOSIS — I480 Paroxysmal atrial fibrillation: Secondary | ICD-10-CM

## 2022-07-05 DIAGNOSIS — J432 Centrilobular emphysema: Secondary | ICD-10-CM

## 2022-07-05 DIAGNOSIS — Z7901 Long term (current) use of anticoagulants: Secondary | ICD-10-CM

## 2022-07-05 DIAGNOSIS — F17210 Nicotine dependence, cigarettes, uncomplicated: Secondary | ICD-10-CM

## 2022-07-05 DIAGNOSIS — E782 Mixed hyperlipidemia: Secondary | ICD-10-CM

## 2022-07-05 MED ORDER — IPRATROPIUM-ALBUTEROL 0.5-2.5 (3) MG/3ML IN SOLN: 3.0000 mL | Freq: Four times a day (QID) | RESPIRATORY_TRACT | Status: DC | PRN

## 2022-07-05 MED ORDER — TRELEGY ELLIPTA 100-62.5-25 MCG/ACT IN AEPB: 1.0000 | INHALATION_SPRAY | Freq: Every day | RESPIRATORY_TRACT | 0 refills | Status: DC

## 2022-07-05 NOTE — Patient Instructions (Signed)
Severe COPD: Stop Advair Start Trelegy 1 puff daily Stop albuterol nebulizer as needed, exchange for DuoNeb.  Use that every 6 hours as needed for chest tightness wheezing or shortness of breath Stay physically active COVID booster ASAP  Cigarette smoking: I encourage you to stop smoking as soon as possible Call 1-800-quit NOW for free nicotine replacement and counseling from the state  Pulmonary hypertension: This term means that the blood pressure in your lungs is increased We will ask for a copy of the echocardiogram recently performed to assess this further  We will see you back in 3 to 4 weeks to see how you are doing on the Trelegy

## 2022-07-05 NOTE — Progress Notes (Signed)
Date:  07/05/2022   ID:  [name], DOB 1951/03/27, MRN [id number]  Date: 07/05/22 Last Office Visit: 12/28/2021  Chief Complaint  Patient presents with   Atrial Fibrillation   Follow-up    HPI  [name] is a 71 y.o. African-American female whose past medical history and cardiovascular risk factors include: Paroxysmal atrial fibrillation status post TEE guided cardioversion (08/2021), COPD, tobacco use, diet-controlled type II diabetes, hypertension, hyperlipidemia, rheumatoid arthritis, GERD .    Patient was hospitalized in November 2022 for COPD exacerbation and went into A-fib with RVR.  She underwent direct-current cardioversion and since then has remained in sinus rhythm.  She follows up today for 60-monthfollow-up visit.  No hospitalizations or urgent care visits since last office encounter for cardiovascular reasons.  She denies anginal discomfort or heart failure symptoms.  Since last office visit she did have a stress test given the new onset of A-fib which was reported to be low risk.  [facility].  She smokes 1 pack every 1.5 weeks.  But is motivated to quit.  FUNCTIONAL STATUS: No structured exercise program or daily routine.   ALLERGIES: Allergies  Allergen Reactions   Azithromycin Anaphylaxis and Swelling    Eyes swell   Benazepril Anaphylaxis    Other reaction(s): SWELLING   Cephalexin Anaphylaxis and Swelling   Benadryl [Diphenhydramine Hcl] Nausea And Vomiting   Other Other (See Comments)    Reports allergy to insects. Unsure of reaction.   Nexium [Esomeprazole Magnesium] Palpitations   Spiriva [Tiotropium Bromide Monohydrate] Palpitations    MEDICATION LIST PRIOR TO VISIT: Current Meds  Medication Sig   albuterol (PROVENTIL) (2.5 MG/3ML) 0.083% nebulizer solution Take 3 mLs (2.5 mg total) by nebulization every 6 (six) hours as needed for wheezing or  shortness of breath.   albuterol (VENTOLIN HFA) 108 (90 Base) MCG/ACT inhaler Inhale 1 puff into the lungs every 6 (six) hours as needed for shortness of breath or wheezing.   apixaban (ELIQUIS) 5 MG TABS tablet Take 1 tablet (5 mg total) by mouth 2 (two) times daily.   atorvastatin (LIPITOR) 40 MG tablet Take 40 mg by mouth daily.   blood glucose meter kit and supplies KIT Dispense based on patient and insurance preference. Use up to four times daily as directed.   diltiazem (CARDIZEM CD) 180 MG 24 hr capsule Take 1 capsule (180 mg total) by mouth daily.   EPINEPHrine 0.3 mg/0.3 mL IJ SOAJ injection Inject 0.3 mg into the muscle as needed for anaphylaxis.   hydrochlorothiazide (HYDRODIURIL) 25 MG tablet TAKE 1 TABLET BY MOUTH ONCE DAILY IN THE MORNING   ibuprofen (ADVIL) 200 MG tablet Take 200 mg by mouth every 6 (six) hours as needed.   losartan (COZAAR) 50 MG tablet Take 1 tablet (50 mg total) by mouth daily at 10 pm.   metFORMIN (GLUCOPHAGE) 500 MG tablet Take 1 tablet (500 mg total) by mouth 2 (two) times daily with a meal.   metoprolol tartrate (LOPRESSOR) 25 MG tablet Take 1 tablet by mouth twice daily   pantoprazole (PROTONIX) 40 MG tablet Take 40 mg by mouth 2 (two) times daily.   potassium chloride (KLOR-CON) 10 MEQ tablet Take 1 tablet by mouth once daily     PAST MEDICAL HISTORY: Past Medical History:  Diagnosis Date   Arthritis    Asthma    COPD (chronic obstructive pulmonary disease) ([place]  Diabetes mellitus    Heart murmur    High cholesterol    Hyperlipidemia    Hypertension    Kidney infection    Kidney stone    Reflux     PAST SURGICAL HISTORY: Past Surgical History:  Procedure Laterality Date   BUBBLE STUDY  08/26/2021   Procedure: BUBBLE STUDY;  Surgeon: Rex Kras, DO;  Location: [place];  Service: Cardiovascular;;   CARDIOVERSION N/A 08/26/2021   Procedure: CARDIOVERSION;  Surgeon: Rex Kras, DO;  Location: [place];  Service: Cardiovascular;   Laterality: N/A;   fillopian tube removal     right breast cyst removal     TEE WITHOUT CARDIOVERSION N/A 08/26/2021   Procedure: TRANSESOPHAGEAL ECHOCARDIOGRAM (TEE);  Surgeon: Rex Kras, DO;  Location: [facility];  Service: Cardiovascular;  Laterality: N/A;    FAMILY HISTORY: The patient family history includes CVA in an other family member; Diabetes in her father; Diabetes Mellitus II in an other family member; Hypertension in her sister and another family member; Stroke in her mother.  SOCIAL HISTORY:  The patient  reports that she quit smoking about 10 months ago. Her smoking use included cigarettes. She started smoking about 55 years ago. She has a 26.00 pack-year smoking history. Her smokeless tobacco use includes chew. She reports that she does not currently use alcohol. She reports that she does not use drugs.  REVIEW OF SYSTEMS: Review of Systems  Cardiovascular:  Negative for chest pain, claudication, dyspnea on exertion, irregular heartbeat, leg swelling, near-syncope, orthopnea, palpitations, paroxysmal nocturnal dyspnea and syncope.  Respiratory:  Negative for shortness of breath.   Hematologic/Lymphatic: Negative for bleeding problem.  Musculoskeletal:  Negative for muscle cramps and myalgias.  Neurological:  Negative for dizziness and light-headedness.    PHYSICAL EXAM:    07/05/2022   12:52 PM 06/12/2022   12:00 AM 06/11/2022   11:37 PM  Vitals with BMI  Height 5' 5"     Weight 178 lbs 3 oz    BMI 50.27    Systolic 741 287   Diastolic 64 71   Pulse 59 46 51    CONSTITUTIONAL: Well-developed and well-nourished. No acute distress.  SKIN: Skin is warm and dry. No rash noted. No cyanosis. No pallor. No jaundice HEAD: Normocephalic and atraumatic.  EYES: No scleral icterus MOUTH/THROAT: Moist oral membranes.  NECK: No JVD present. No thyromegaly noted. No carotid bruits  CHEST Normal respiratory effort. No intercostal retractions  LUNGS: Clear to auscultation  bilaterally.  No stridor. No wheezes. No rales.  CARDIOVASCULAR: Regular rate and rhythm, positive S1-S2, no murmurs rubs or gallops appreciated. ABDOMINAL: Obese, soft, nontender, nondistended, positive bowel sounds all 4 quadrants no apparent ascites.  EXTREMITIES: No peripheral edema, warm to touch, 2+ bilateral DP and PT pulses HEMATOLOGIC: No significant bruising NEUROLOGIC: Oriented to person, place, and time. Nonfocal. Normal muscle tone.  PSYCHIATRIC: Normal mood and affect. Normal behavior. Cooperative  CARDIAC DATABASE: EKG: 10/07/2021: Normal sinus rhythm, 60 bpm, without underlying ischemia or injury pattern.  12/28/2021: Sinus rhythm at a rate of 50 bpm   07/05/2022: Sinus bradycardia, 56 bpm, without underlying ischemia injury pattern  Echocardiogram: 08/23/2021: LVEF 55-60%, normal wall motion, right ventricular size and function normal, trivial MR, global longitudinal strain -20.6%  06/29/2022 [facility] health outside records provided by the patient): LVEF 86-76%, grade 2 diastolic dysfunction, elevated LAP, mildly dilated left atrium, mild TR.   Stress Testing: Exercise Myoview stress test 12/28/2021: Exercise nuclear stress test was performed using Bruce protocol. 1  Day Rest/Stress Protocol. Exercise time 4 minutes 10 seconds, 6.04 METS, 88%APMHR.  Stress ECG negative for ischemia.  Normal myocardial perfusion with diaphragmatic attenuation artifact.  No convincing evidence of reversible myocardial ischemia or prior infarct. Left ventricular size is normal, LVEF visually preserved.  Low risk study.  Heart Catheterization: None  TEE guided cardioversion: 08/26/2021: 200 J x 1 restored to sinus rhythm  LABORATORY DATA:    Latest Ref Rng & Units 06/11/2022   10:35 PM 08/27/2021    2:30 AM 08/26/2021    2:28 AM  CBC  WBC 4.0 - 10.5 K/uL 9.9  15.4  20.6   Hemoglobin 12.0 - 15.0 g/dL 11.0  13.5  12.6   Hematocrit 36.0 - 46.0 % 33.9  42.2  39.0   Platelets 150  - 400 K/uL 219  255  263        Latest Ref Rng & Units 06/11/2022   10:35 PM 10/20/2021    2:55 PM 08/26/2021    2:28 AM  CMP  Glucose 70 - 99 mg/dL 84  69  206   BUN 8 - 23 mg/dL 21  19  22    Creatinine 0.44 - 1.00 mg/dL 0.98  1.10  0.93   Sodium 135 - 145 mmol/L 135  142  137   Potassium 3.5 - 5.1 mmol/L 3.4  3.4  4.0   Chloride 98 - 111 mmol/L 97  97  99   CO2 22 - 32 mmol/L 31  30  31    Calcium 8.9 - 10.3 mg/dL 9.2  10.4  9.3   Total Protein 6.5 - 8.1 g/dL 6.5     Total Bilirubin 0.3 - 1.2 mg/dL 0.6     Alkaline Phos 38 - 126 U/L 48     AST 15 - 41 U/L 13     ALT 0 - 44 U/L 8       Lipid Panel  No results found for: "CHOL", "TRIG", "HDL", "CHOLHDL", "VLDL", "LDLCALC", "LDLDIRECT", "LABVLDL"  No components found for: "NTPROBNP" No results for input(s): "PROBNP" in the last 8760 hours. Recent Labs    08/23/21 1820  TSH 0.079*    BMP Recent Labs    08/25/21 0204 08/26/21 0228 10/20/21 1455 06/11/22 2235  NA 132* 137 142 135  K 4.1 4.0 3.4* 3.4*  CL 97* 99 97 97*  CO2 26 31 30* 31  GLUCOSE 335* 206* 69* 84  BUN 27* 22 19 21   CREATININE 1.04* 0.93 1.10* 0.98  CALCIUM 9.3 9.3 10.4* 9.2  GFRNONAA 59* >60  --  >60    HEMOGLOBIN A1C Lab Results  Component Value Date   HGBA1C 7.3 (H) 08/23/2021   MPG 163 08/23/2021    External Labs: Collected: [facility] 04/11/2022 available in Care Everywhere Hemoglobin 11.9 g/dL, hematocrit 37% Sodium 142, potassium 3.7, chloride 102, bicarb 37, BUN 19, creatinine 1.34. AST 11, ALT 8, alkaline phosphatase 65. eGFR 43  IMPRESSION:    ICD-10-CM   1. Paroxysmal A-fib (HCC)  I48.0 EKG 12-Lead    Hemoglobin and hematocrit, blood    Basic metabolic panel    2. Long term (current) use of anticoagulants  Z79.01 Hemoglobin and hematocrit, blood    Basic metabolic panel    3. History of cardioversion  Z92.89     4. Benign hypertension  I10     5. Mixed hyperlipidemia  E78.2     6.  Non-insulin dependent type 2 diabetes mellitus ([place])  E11.9  7. Chronic obstructive pulmonary disease, unspecified COPD type ([place])  J44.9     8. Cigarette smoker  F17.210        RECOMMENDATIONS: [name] is a 71 y.o. African-American female whose past medical history and cardiac risk factors include: Paroxysmal atrial fibrillation status post TEE guided cardioversion (08/2021), COPD, tobacco use, diet-controlled type II diabetes, hypertension, hyperlipidemia, rheumatoid arthritis, GERD .    Paroxysmal A-fib (HCC) Rate control: Cardizem, Lopressor  Rhythm control: N/A. Thromboembolic prophylaxis: Eliquis Status post TEE guided cardioversion 08/26/2021 CHA2DS2-VASc SCORE is 4 which correlates to 4% risk of stroke per year (HTN, age, diabetes, gender) Discussed discontinuation of Lopressor and uptitrating Cardizem; however, patient would like to continue the same medical regimen as she still the hospital. Check H&H and BMP.  Long term (current) use of anticoagulants Indication: Paroxysmal atrial fibrillation and cardioversion 08/26/2021 Does not endorse evidence of bleeding  Benign hypertension Office blood pressures are well controlled. No medication changes warranted at this time.  Mixed hyperlipidemia Currently on atorvastatin.   She denies myalgia or other side effects. Currently managed by primary care provider.   Non-insulin dependent type 2 diabetes mellitus ([place]) Currently managed by PCP. Continue losartan, statin therapy Educated in the importance of glycemic control given her other cardiovascular risk factors.  FINAL MEDICATION LIST END OF ENCOUNTER: No orders of the defined types were placed in this encounter.   Medications Discontinued During This Encounter  Medication Reason   famotidine (PEPCID) 20 MG tablet    traMADol (ULTRAM) 50 MG tablet    traMADol (ULTRAM) 50 MG tablet      Current Outpatient Medications:    albuterol (PROVENTIL) (2.5 MG/3ML)  0.083% nebulizer solution, Take 3 mLs (2.5 mg total) by nebulization every 6 (six) hours as needed for wheezing or shortness of breath., Disp: 75 mL, Rfl: 12   albuterol (VENTOLIN HFA) 108 (90 Base) MCG/ACT inhaler, Inhale 1 puff into the lungs every 6 (six) hours as needed for shortness of breath or wheezing., Disp: , Rfl:    apixaban (ELIQUIS) 5 MG TABS tablet, Take 1 tablet (5 mg total) by mouth 2 (two) times daily., Disp: 60 tablet, Rfl: 2   atorvastatin (LIPITOR) 40 MG tablet, Take 40 mg by mouth daily., Disp: , Rfl:    blood glucose meter kit and supplies KIT, Dispense based on patient and insurance preference. Use up to four times daily as directed., Disp: 1 each, Rfl: 0   diltiazem (CARDIZEM CD) 180 MG 24 hr capsule, Take 1 capsule (180 mg total) by mouth daily., Disp: 30 capsule, Rfl: 3   EPINEPHrine 0.3 mg/0.3 mL IJ SOAJ injection, Inject 0.3 mg into the muscle as needed for anaphylaxis., Disp: 1 each, Rfl: 1   hydrochlorothiazide (HYDRODIURIL) 25 MG tablet, TAKE 1 TABLET BY MOUTH ONCE DAILY IN THE MORNING, Disp: 90 tablet, Rfl: 0   ibuprofen (ADVIL) 200 MG tablet, Take 200 mg by mouth every 6 (six) hours as needed., Disp: , Rfl:    losartan (COZAAR) 50 MG tablet, Take 1 tablet (50 mg total) by mouth daily at 10 pm., Disp: 90 tablet, Rfl: 3   metFORMIN (GLUCOPHAGE) 500 MG tablet, Take 1 tablet (500 mg total) by mouth 2 (two) times daily with a meal., Disp: 30 tablet, Rfl: 11   metoprolol tartrate (LOPRESSOR) 25 MG tablet, Take 1 tablet by mouth twice daily, Disp: 60 tablet, Rfl: 0   pantoprazole (PROTONIX) 40 MG tablet, Take 40 mg by mouth 2 (two) times daily., Disp: ,  Rfl:    potassium chloride (KLOR-CON) 10 MEQ tablet, Take 1 tablet by mouth once daily, Disp: 30 tablet, Rfl: 0   fluticasone-salmeterol (ADVAIR) 100-50 MCG/ACT AEPB, Inhale 1 puff into the lungs 2 (two) times daily., Disp: , Rfl:    gabapentin (NEURONTIN) 100 MG capsule, Take 100 mg by mouth 3 (three) times daily as needed  (pain). (Patient not taking: Reported on 12/28/2021), Disp: , Rfl:    lidocaine (XYLOCAINE) 2 % solution, Use as directed 15 mLs in the mouth or throat every 4 (four) hours as needed for mouth pain., Disp: 100 mL, Rfl: 0   loratadine (CLARITIN) 10 MG tablet, Take 1 tablet (10 mg total) by mouth daily. (Patient taking differently: Take 10 mg by mouth daily as needed for allergies.), Disp: 30 tablet, Rfl: 1  Orders Placed This Encounter  Procedures   Hemoglobin and hematocrit, blood   Basic metabolic panel   EKG [id number]     There are no Patient Instructions on file for this visit.   --Continue cardiac medications as reconciled in final medication list. --Return in about 6 months (around 01/04/2023) for Follow up, A. fib. Or sooner if needed. --Continue follow-up with your primary care physician regarding the management of your other chronic comorbid conditions.  Patient's questions and concerns were addressed to her satisfaction. She voices understanding of the instructions provided during this encounter.   This note was created using a voice recognition software as a result there may be grammatical errors inadvertently enclosed that do not reflect the nature of this encounter. Every attempt is made to correct such errors.  Rex Kras, [place], [facility]  Pager: [phone] Office: [phone]

## 2022-07-05 NOTE — Addendum Note (Signed)
Addended by: Darliss Ridgel on: 07/05/2022 03:37 PM   Modules accepted: Orders

## 2022-07-18 ENCOUNTER — Ambulatory Visit: Payer: Medicare Other | Admitting: Cardiology

## 2022-07-20 ENCOUNTER — Telehealth: Payer: Self-pay | Admitting: Pulmonary Disease

## 2022-07-20 NOTE — Telephone Encounter (Signed)
Pt called stating that she was told she was not able to get the Trelegy. Is the Trelegy considered non-formulary with insurance?  Routing to prior auth team for review.

## 2022-07-21 ENCOUNTER — Other Ambulatory Visit (HOSPITAL_COMMUNITY): Payer: Self-pay

## 2022-07-21 MED ORDER — TRELEGY ELLIPTA 100-62.5-25 MCG/ACT IN AEPB: 1.0000 | INHALATION_SPRAY | Freq: Every day | RESPIRATORY_TRACT | 5 refills | Status: DC

## 2022-07-21 NOTE — Telephone Encounter (Signed)
Per test claim, Trelegy has a co-pay of $0.00. I did notice that the hard copy I received that was written 07-05-22 by Dr Lake Bells has a quantity of 28. This inhaler would be run as 60. Please confirm with patient or pharmacy that it is being ran correctly.

## 2022-07-21 NOTE — Telephone Encounter (Signed)
No Rx had actually been sent to the pharmacy so I have sent Rx to pharmacy for pt. Called and spoke with pt letting her know this had been done and she verbalized understanding. Nothing further needed.

## 2022-07-27 ENCOUNTER — Ambulatory Visit: Payer: Medicare Other | Admitting: Pulmonary Disease

## 2022-07-27 NOTE — Progress Notes (Deleted)
Synopsis: Referred in October 2023, has severe COPD and emphysema.  Was still smoking cigarettes in October 2023.  Subjective:   PATIENT ID: [name] GENDER: female DOB: [birth year], MRN: [id number]   HPI  No chief complaint on file.  ***Follow-up Trelegy  Past Medical History:  Diagnosis Date   Arthritis    Asthma    COPD (chronic obstructive pulmonary disease) (HCC)    Diabetes mellitus    Heart murmur    High cholesterol    Hyperlipidemia    Hypertension    Kidney infection    Kidney stone    Reflux        ROS    Objective:  Physical Exam   There were no vitals filed for this visit.  Walked 500 feet on room air and O2 saturation dropped as low as 88%  ***   CBC    Component Value Date/Time   WBC 9.9 06/11/2022 2235   RBC 4.01 06/11/2022 2235   HGB 11.0 (L) 06/11/2022 2235   HCT 33.9 (L) 06/11/2022 2235   PLT 219 06/11/2022 2235   MCV 84.5 06/11/2022 2235   MCH 27.4 06/11/2022 2235   MCHC 32.4 06/11/2022 2235   RDW 14.5 06/11/2022 2235   LYMPHSABS 3.5 06/11/2022 2235   MONOABS 0.7 06/11/2022 2235   EOSABS 0.2 06/11/2022 2235   BASOSABS 0.1 06/11/2022 2235     Chest imaging: 2022 portable chest x-ray images independently reviewed showing normal pulmonary parenchyma, normal cardiac silhouette July 2023 CT angiogram chest report read: "1. No pulmonary embolism. 2. Moderate multi-vessel coronary artery calcification. 3. Morphologic changes in keeping with pulmonary arterial hypertension. 4. Mild emphysema. Bronchial wall thickening in keeping with airway inflammation."  PFT: October 2023 spirometry ratio 59%, FEV1 0.60 L 32% predicted  Labs: September 2023 hemoglobin 11 g/dL, eosinophil 200 cells per microliter  Path:  Echo:  Heart Catheterization:       Assessment & Plan:   No diagnosis found.  Discussion: ***  Immunizations: Immunization History  Administered Date(s) Administered   Fluad Quad(high Dose 65+)  06/28/2022   Influenza Split 06/26/2012, 08/19/2013, 07/05/2014, 07/26/2016   Influenza, High Dose Seasonal PF 07/26/2018, 07/29/2019   Influenza,inj,Quad PF,6+ Mos 07/16/2015, 07/26/2016, 07/10/2017   Influenza-Unspecified 06/26/2012, 08/19/2013, 07/05/2014, 07/26/2016   Pneumococcal Conjugate-13 01/22/2015     Current Outpatient Medications:    albuterol (PROVENTIL) (2.5 MG/3ML) 0.083% nebulizer solution, Take 3 mLs (2.5 mg total) by nebulization every 6 (six) hours as needed for wheezing or shortness of breath., Disp: 75 mL, Rfl: 12   albuterol (VENTOLIN HFA) 108 (90 Base) MCG/ACT inhaler, Inhale 1 puff into the lungs every 6 (six) hours as needed for shortness of breath or wheezing., Disp: , Rfl:    apixaban (ELIQUIS) 5 MG TABS tablet, Take 1 tablet (5 mg total) by mouth 2 (two) times daily., Disp: 60 tablet, Rfl: 2   atorvastatin (LIPITOR) 40 MG tablet, Take 40 mg by mouth daily., Disp: , Rfl:    blood glucose meter kit and supplies KIT, Dispense based on patient and insurance preference. Use up to four times daily as directed., Disp: 1 each, Rfl: 0   diltiazem (CARDIZEM CD) 180 MG 24 hr capsule, Take 1 capsule (180 mg total) by mouth daily., Disp: 30 capsule, Rfl: 3   EPINEPHrine 0.3 mg/0.3 mL IJ SOAJ injection, Inject 0.3 mg into the muscle as needed for anaphylaxis., Disp: 1 each, Rfl: 1   fluticasone-salmeterol (ADVAIR) 100-50 MCG/ACT AEPB, Inhale 1 puff into the  lungs 2 (two) times daily., Disp: , Rfl:    Fluticasone-Umeclidin-Vilant (TRELEGY ELLIPTA) 100-62.5-25 MCG/ACT AEPB, Inhale 1 puff into the lungs daily., Disp: 60 each, Rfl: 5   gabapentin (NEURONTIN) 100 MG capsule, Take 100 mg by mouth 3 (three) times daily as needed (pain). (Patient not taking: Reported on 12/28/2021), Disp: , Rfl:    hydrochlorothiazide (HYDRODIURIL) 25 MG tablet, TAKE 1 TABLET BY MOUTH ONCE DAILY IN THE MORNING, Disp: 90 tablet, Rfl: 0   ibuprofen (ADVIL) 200 MG tablet, Take 200 mg by mouth every 6 (six)  hours as needed., Disp: , Rfl:    lidocaine (XYLOCAINE) 2 % solution, Use as directed 15 mLs in the mouth or throat every 4 (four) hours as needed for mouth pain., Disp: 100 mL, Rfl: 0   loratadine (CLARITIN) 10 MG tablet, Take 1 tablet (10 mg total) by mouth daily. (Patient not taking: Reported on 07/05/2022), Disp: 30 tablet, Rfl: 1   losartan (COZAAR) 50 MG tablet, Take 1 tablet (50 mg total) by mouth daily at 10 pm., Disp: 90 tablet, Rfl: 3   metFORMIN (GLUCOPHAGE) 500 MG tablet, Take 1 tablet (500 mg total) by mouth 2 (two) times daily with a meal., Disp: 30 tablet, Rfl: 11   metoprolol tartrate (LOPRESSOR) 25 MG tablet, Take 1 tablet by mouth twice daily, Disp: 60 tablet, Rfl: 0   pantoprazole (PROTONIX) 40 MG tablet, Take 40 mg by mouth 2 (two) times daily., Disp: , Rfl:    potassium chloride (KLOR-CON) 10 MEQ tablet, Take 1 tablet by mouth once daily, Disp: 30 tablet, Rfl: 0  Current Facility-Administered Medications:    ipratropium-albuterol (DUONEB) 0.5-2.5 (3) MG/3ML nebulizer solution 3 mL, 3 mL, Nebulization, Q6H PRN, Juanito Doom, MD

## 2022-08-04 ENCOUNTER — Ambulatory Visit (INDEPENDENT_AMBULATORY_CARE_PROVIDER_SITE_OTHER): Payer: Medicare Other | Admitting: Pulmonary Disease

## 2022-08-04 ENCOUNTER — Encounter: Payer: Self-pay | Admitting: Pulmonary Disease

## 2022-08-04 VITALS — BP 124/72 | HR 58 | Ht 64.0 in | Wt 180.0 lb

## 2022-08-04 DIAGNOSIS — J432 Centrilobular emphysema: Secondary | ICD-10-CM | POA: Diagnosis not present

## 2022-08-04 DIAGNOSIS — Z87891 Personal history of nicotine dependence: Secondary | ICD-10-CM

## 2022-08-04 NOTE — Progress Notes (Signed)
Synopsis: Referred in October 2023, has severe COPD and emphysema.  Was still smoking cigarettes in October 2023.  Subjective:   PATIENT ID: [name] GENDER: female DOB: August 30, 1951, MRN: [id number]   HPI  Chief Complaint  Patient presents with   Follow-up    4wk f/u. States the Trelegy has helped her breathing.    Trelegy has really helped Cough has decreased Minimal dyspnea The abnormal sensation in her chest has gone away, only rarely shows up now Smoking 2 cigarettes a day Flu shot is up to date   Past Medical History:  Diagnosis Date   Arthritis    Asthma    COPD (chronic obstructive pulmonary disease) (HCC)    Diabetes mellitus    Heart murmur    High cholesterol    Hyperlipidemia    Hypertension    Kidney infection    Kidney stone    Reflux        Review of Systems  Constitutional:  Negative for chills, fever, malaise/fatigue and weight loss.  HENT:  Negative for congestion, sinus pain and sore throat.   Respiratory:  Negative for cough, sputum production and shortness of breath.   Cardiovascular:  Negative for chest pain and leg swelling.      Objective:  Physical Exam   Vitals:   08/04/22 1107  BP: 124/72  Pulse: (!) 58  SpO2: 97%  Weight: 180 lb (81.6 kg)  Height: _0  (1.626 m)   Walked 500 feet on room air and O2 saturation dropped as low as 88%  Gen: well appearing HENT: OP clear, TM's clear, neck supple PULM: CTA B, normal percussion CV: RRR, no mgr, trace edema GI: BS+, soft, nontender Derm: no cyanosis or rash Psyche: normal mood and affect    CBC    Component Value Date/Time   WBC 9.9 06/11/2022 2235   RBC 4.01 06/11/2022 2235   HGB 11.0 (L) 06/11/2022 2235   HCT 33.9 (L) 06/11/2022 2235   PLT 219 06/11/2022 2235   MCV 84.5 06/11/2022 2235   MCH 27.4 06/11/2022 2235   MCHC 32.4 06/11/2022 2235   RDW 14.5 06/11/2022 2235   LYMPHSABS 3.5 06/11/2022 2235   MONOABS 0.7 06/11/2022 2235   EOSABS 0.2 06/11/2022  2235   BASOSABS 0.1 06/11/2022 2235     Chest imaging: 2022 portable chest x-ray images independently reviewed showing normal pulmonary parenchyma, normal cardiac silhouette July 2023 CT angiogram chest report read: "1. No pulmonary embolism. 2. Moderate multi-vessel coronary artery calcification. 3. Morphologic changes in keeping with pulmonary arterial hypertension. 4. Mild emphysema. Bronchial wall thickening in keeping with airway inflammation."  PFT: October 2023 spirometry ratio 59%, FEV1 0.60 L 32% predicted  Labs: September 2023 hemoglobin 11 g/dL, eosinophil 200 cells per microliter  Path:  Echo: December 2022 TEE LVE 45-50%, RV size and function normal, bubble study negative  Heart Catheterization:       Assessment & Plan:   Centrilobular emphysema ([place])  Former cigarette smoker  Discussion: This has been a stable interval for [name] since starting Trelegy.  She has not had side effects from it and it is controlling her shortness of breath and cough well.  Plan: COPD: Gold grade B Continue Trelegy 1 puff daily no matter how you feel Use albuterol as needed for chest tightness wheezing or shortness of breath Practice good hand hygiene Stay physically active I am glad that your pneumonia and flu shots are up-to-date Please read through the RSV information that I  gave you and consider RSV vaccination  Cigarette smoking: Continue efforts to quit smoking, if you need nicotine replacement therapy prescribed or other medical treatment I am happy to help you with that Alternatively, you can call 1-800-quit-NOW to get free nicotine replacement from the state of [place] We will refer you to lung cancer screening  I will see you back in 1 year or sooner if needed   Immunizations: Immunization History  Administered Date(s) Administered   Fluad Quad(high Dose 65+) 06/28/2022   Influenza Split 06/26/2012, 08/19/2013, 07/05/2014, 07/26/2016   Influenza, High  Dose Seasonal PF 07/26/2018, 07/29/2019   Influenza,inj,Quad PF,6+ Mos 07/16/2015, 07/26/2016, 07/10/2017   Influenza-Unspecified 06/26/2012, 08/19/2013, 07/05/2014, 07/26/2016   Pneumococcal Conjugate-13 01/22/2015     Current Outpatient Medications:    albuterol (PROVENTIL) (2.5 MG/3ML) 0.083% nebulizer solution, Take 3 mLs (2.5 mg total) by nebulization every 6 (six) hours as needed for wheezing or shortness of breath., Disp: 75 mL, Rfl: 12   albuterol (VENTOLIN HFA) 108 (90 Base) MCG/ACT inhaler, Inhale 1 puff into the lungs every 6 (six) hours as needed for shortness of breath or wheezing., Disp: , Rfl:    apixaban (ELIQUIS) 5 MG TABS tablet, Take 1 tablet (5 mg total) by mouth 2 (two) times daily., Disp: 60 tablet, Rfl: 2   atorvastatin (LIPITOR) 40 MG tablet, Take 40 mg by mouth daily., Disp: , Rfl:    blood glucose meter kit and supplies KIT, Dispense based on patient and insurance preference. Use up to four times daily as directed., Disp: 1 each, Rfl: 0   diltiazem (CARDIZEM CD) 180 MG 24 hr capsule, Take 1 capsule (180 mg total) by mouth daily., Disp: 30 capsule, Rfl: 3   EPINEPHrine 0.3 mg/0.3 mL IJ SOAJ injection, Inject 0.3 mg into the muscle as needed for anaphylaxis., Disp: 1 each, Rfl: 1   Fluticasone-Umeclidin-Vilant (TRELEGY ELLIPTA) 100-62.5-25 MCG/ACT AEPB, Inhale 1 puff into the lungs daily., Disp: 60 each, Rfl: 5   gabapentin (NEURONTIN) 100 MG capsule, Take 100 mg by mouth 3 (three) times daily as needed (pain)., Disp: , Rfl:    hydrochlorothiazide (HYDRODIURIL) 25 MG tablet, TAKE 1 TABLET BY MOUTH ONCE DAILY IN THE MORNING, Disp: 90 tablet, Rfl: 0   ibuprofen (ADVIL) 200 MG tablet, Take 200 mg by mouth every 6 (six) hours as needed., Disp: , Rfl:    lidocaine (XYLOCAINE) 2 % solution, Use as directed 15 mLs in the mouth or throat every 4 (four) hours as needed for mouth pain., Disp: 100 mL, Rfl: 0   loratadine (CLARITIN) 10 MG tablet, Take 1 tablet (10 mg total) by mouth  daily., Disp: 30 tablet, Rfl: 1   losartan (COZAAR) 50 MG tablet, Take 1 tablet (50 mg total) by mouth daily at 10 pm., Disp: 90 tablet, Rfl: 3   metoprolol tartrate (LOPRESSOR) 25 MG tablet, Take 1 tablet by mouth twice daily, Disp: 60 tablet, Rfl: 0   pantoprazole (PROTONIX) 40 MG tablet, Take 40 mg by mouth 2 (two) times daily., Disp: , Rfl:    potassium chloride (KLOR-CON) 10 MEQ tablet, Take 1 tablet by mouth once daily, Disp: 30 tablet, Rfl: 0   metFORMIN (GLUCOPHAGE) 500 MG tablet, Take 1 tablet (500 mg total) by mouth 2 (two) times daily with a meal., Disp: 30 tablet, Rfl: 11  Current Facility-Administered Medications:    ipratropium-albuterol (DUONEB) 0.5-2.5 (3) MG/3ML nebulizer solution 3 mL, 3 mL, Nebulization, Q6H PRN, Juanito Doom, MD

## 2022-08-04 NOTE — Patient Instructions (Signed)
COPD: Gold grade B Continue Trelegy 1 puff daily no matter how you feel Use albuterol as needed for chest tightness wheezing or shortness of breath Practice good hand hygiene Stay physically active I am glad that your pneumonia and flu shots are up-to-date Please read through the RSV information that I gave you and consider RSV vaccination  Cigarette smoking: Continue efforts to quit smoking, if you need nicotine replacement therapy prescribed or other medical treatment I am happy to help you with that Alternatively, you can call 1-800-quit-NOW to get free nicotine replacement from the state of [place] We will refer you to lung cancer screening  I will see you back in 1 year or sooner if needed

## 2022-09-30 ENCOUNTER — Other Ambulatory Visit: Payer: Self-pay | Admitting: Cardiology

## 2022-09-30 DIAGNOSIS — I10 Essential (primary) hypertension: Secondary | ICD-10-CM

## 2022-10-17 ENCOUNTER — Other Ambulatory Visit: Payer: Self-pay | Admitting: *Deleted

## 2022-10-17 DIAGNOSIS — F17210 Nicotine dependence, cigarettes, uncomplicated: Secondary | ICD-10-CM

## 2022-10-17 DIAGNOSIS — Z122 Encounter for screening for malignant neoplasm of respiratory organs: Secondary | ICD-10-CM

## 2022-10-17 DIAGNOSIS — Z87891 Personal history of nicotine dependence: Secondary | ICD-10-CM

## 2022-11-04 ENCOUNTER — Other Ambulatory Visit: Payer: Self-pay

## 2022-11-04 DIAGNOSIS — I10 Essential (primary) hypertension: Secondary | ICD-10-CM

## 2022-11-04 MED ORDER — LOSARTAN POTASSIUM 50 MG PO TABS: 50.0000 mg | ORAL_TABLET | Freq: Every day | ORAL | 3 refills | Status: DC

## 2022-11-29 ENCOUNTER — Telehealth: Payer: Self-pay | Admitting: Acute Care

## 2022-11-29 ENCOUNTER — Encounter: Payer: 59 | Admitting: Acute Care

## 2022-11-29 NOTE — Telephone Encounter (Signed)
Pt calling for results. Pls try again @ [phone]

## 2022-11-29 NOTE — Telephone Encounter (Signed)
I have attempted to call the patient with the results of their  Low Dose CT Chest Lung cancer screening scan. There was no answer. I have left a HIPPA compliant VM requesting the patient call the office for the scan results. I included the office contact information in the message. We will await his return call. If no return call we will continue to call until patient is contacted.

## 2022-11-29 NOTE — Telephone Encounter (Signed)
Pt. Was a no show for the [facility] I have called twice. I left a HIPAA compliant message each time with the office contact number. Patient is scheduled for LDCT 12/01/2022 at 3 pm. This will need to be rescheduled, with a SDMV prior.  Langley Gauss, please reschedule CT and SDMV. Thanks so much

## 2022-11-29 NOTE — Telephone Encounter (Signed)
Sdmv No show and LDCT appt has been cancelled.

## 2022-11-30 NOTE — Telephone Encounter (Signed)
ATC patient. LVMTCB. 

## 2022-12-01 ENCOUNTER — Ambulatory Visit (HOSPITAL_BASED_OUTPATIENT_CLINIC_OR_DEPARTMENT_OTHER): Payer: 59

## 2022-12-03 ENCOUNTER — Ambulatory Visit (HOSPITAL_BASED_OUTPATIENT_CLINIC_OR_DEPARTMENT_OTHER): Payer: 59

## 2022-12-10 ENCOUNTER — Ambulatory Visit (HOSPITAL_BASED_OUTPATIENT_CLINIC_OR_DEPARTMENT_OTHER)
Admission: RE | Admit: 2022-12-10 | Discharge: 2022-12-10 | Disposition: A | Discharge status: Home / Self Care | Payer: 59 | Source: Ambulatory Visit | Attending: Acute Care | Admitting: Acute Care

## 2022-12-10 DIAGNOSIS — F17210 Nicotine dependence, cigarettes, uncomplicated: Secondary | ICD-10-CM | POA: Diagnosis present

## 2022-12-10 DIAGNOSIS — Z87891 Personal history of nicotine dependence: Secondary | ICD-10-CM | POA: Diagnosis not present

## 2022-12-10 DIAGNOSIS — Z122 Encounter for screening for malignant neoplasm of respiratory organs: Secondary | ICD-10-CM | POA: Insufficient documentation

## 2022-12-22 ENCOUNTER — Telehealth: Payer: Self-pay | Admitting: *Deleted

## 2022-12-22 NOTE — Telephone Encounter (Signed)
Pt called in and left VM to reschedule shared decision visit for lung cancer screening. I attempted to call pt back but it went straight to VM. I left a message explaining that she may need to add our number to her phone is she has a spam blocker on her phone and also that we would like to reschedule her televisit for lung cancer screening. Will await pt call back.

## 2023-01-02 ENCOUNTER — Other Ambulatory Visit: Payer: Self-pay

## 2023-01-02 ENCOUNTER — Encounter (HOSPITAL_BASED_OUTPATIENT_CLINIC_OR_DEPARTMENT_OTHER): Payer: Self-pay | Admitting: Urology

## 2023-01-02 ENCOUNTER — Emergency Department (HOSPITAL_BASED_OUTPATIENT_CLINIC_OR_DEPARTMENT_OTHER)
Admission: EM | Admit: 2023-01-02 | Discharge: 2023-01-02 | Disposition: A | Discharge status: Home / Self Care | Payer: 59 | Attending: Emergency Medicine | Admitting: Emergency Medicine

## 2023-01-02 ENCOUNTER — Emergency Department (HOSPITAL_BASED_OUTPATIENT_CLINIC_OR_DEPARTMENT_OTHER): Payer: 59

## 2023-01-02 DIAGNOSIS — Z7984 Long term (current) use of oral hypoglycemic drugs: Secondary | ICD-10-CM | POA: Diagnosis not present

## 2023-01-02 DIAGNOSIS — E876 Hypokalemia: Secondary | ICD-10-CM | POA: Insufficient documentation

## 2023-01-02 DIAGNOSIS — E119 Type 2 diabetes mellitus without complications: Secondary | ICD-10-CM | POA: Insufficient documentation

## 2023-01-02 DIAGNOSIS — Z7901 Long term (current) use of anticoagulants: Secondary | ICD-10-CM | POA: Diagnosis not present

## 2023-01-02 DIAGNOSIS — K5900 Constipation, unspecified: Secondary | ICD-10-CM | POA: Insufficient documentation

## 2023-01-02 DIAGNOSIS — I10 Essential (primary) hypertension: Secondary | ICD-10-CM | POA: Insufficient documentation

## 2023-01-02 LAB — COMPREHENSIVE METABOLIC PANEL
ALT: 12 U/L (ref 0–44)
AST: 18 U/L (ref 15–41)
Albumin: 4.2 g/dL (ref 3.5–5.0)
Alkaline Phosphatase: 62 U/L (ref 38–126)
Anion gap: 10 (ref 5–15)
BUN: 12 mg/dL (ref 8–23)
CO2: 29 mmol/L (ref 22–32)
Calcium: 9.7 mg/dL (ref 8.9–10.3)
Chloride: 88 mmol/L — ABNORMAL LOW (ref 98–111)
Creatinine, Ser: 0.96 mg/dL (ref 0.44–1.00)
GFR, Estimated: >60 mL/min (ref >60)
Glucose, Bld: 126 mg/dL — ABNORMAL HIGH (ref 70–99)
Potassium: 3.1 mmol/L — ABNORMAL LOW (ref 3.5–5.1)
Sodium: 127 mmol/L — ABNORMAL LOW (ref 135–145)
Total Bilirubin: 0.7 mg/dL (ref 0.3–1.2)
Total Protein: 7.2 g/dL (ref 6.5–8.1)

## 2023-01-02 LAB — CBC WITH DIFFERENTIAL/PLATELET
Abs Immature Granulocytes: 0.03 10*3/uL (ref 0.00–0.07)
Basophils Absolute: 0 10*3/uL (ref 0.0–0.1)
Basophils Relative: 0 %
Eosinophils Absolute: 0.1 10*3/uL (ref 0.0–0.5)
Eosinophils Relative: 2 %
HCT: 35.2 % — ABNORMAL LOW (ref 36.0–46.0)
Hemoglobin: 11.8 g/dL — ABNORMAL LOW (ref 12.0–15.0)
Immature Granulocytes: 0 %
Lymphocytes Relative: 28 %
Lymphs Abs: 2.5 10*3/uL (ref 0.7–4.0)
MCH: 27.8 pg (ref 26.0–34.0)
MCHC: 33.5 g/dL (ref 30.0–36.0)
MCV: 83 fL (ref 80.0–100.0)
Monocytes Absolute: 0.7 10*3/uL (ref 0.1–1.0)
Monocytes Relative: 7 %
Neutro Abs: 5.8 10*3/uL (ref 1.7–7.7)
Neutrophils Relative %: 63 %
Platelets: 249 10*3/uL (ref 150–400)
RBC: 4.24 MIL/uL (ref 3.87–5.11)
RDW: 14.5 % (ref 11.5–15.5)
WBC: 9.2 10*3/uL (ref 4.0–10.5)
nRBC: 0 % (ref 0.0–0.2)

## 2023-01-02 LAB — URINALYSIS, ROUTINE W REFLEX MICROSCOPIC
Bilirubin Urine: NEGATIVE
Glucose, UA: NEGATIVE mg/dL
Ketones, ur: NEGATIVE mg/dL
Leukocytes,Ua: NEGATIVE
Nitrite: NEGATIVE
Protein, ur: NEGATIVE mg/dL
Specific Gravity, Urine: 1.01 (ref 1.005–1.030)
pH: 6 (ref 5.0–8.0)

## 2023-01-02 LAB — LIPASE, BLOOD: Lipase: 45 U/L (ref 11–51)

## 2023-01-02 LAB — URINALYSIS, MICROSCOPIC (REFLEX)

## 2023-01-02 MED ORDER — GOLYTELY 236 G PO SOLR: 4.0000 L | Freq: Once | ORAL | 0 refills | Status: AC

## 2023-01-02 MED ORDER — ONDANSETRON HCL 4 MG/2ML IJ SOLN
4.0000 mg | Freq: Once | INTRAMUSCULAR | Status: AC
  Administered 2023-01-02: 4 mg via INTRAVENOUS
  Filled 2023-01-02: qty 2

## 2023-01-02 MED ORDER — SODIUM CHLORIDE 0.9 % IV BOLUS
1000.0000 mL | Freq: Once | INTRAVENOUS | Status: AC
  Administered 2023-01-02: 1000 mL via INTRAVENOUS

## 2023-01-02 NOTE — ED Notes (Signed)
Pt. Reports she has been constipated for a week or more.  Pt. Said she is having R sided pain.  Pt. Has been taking all softners and doing all to get rid of the stool noted a week ago.  Pt. Reports she has had R sided pain even a week ago.  Pt. Reports no labs were checked a week ago.  Pt. Reports nausea and no vomiting.  She did have a BM today.  She does report eating lots of peanuts.   No trouble peeing per Pt.

## 2023-01-02 NOTE — ED Triage Notes (Signed)
Pt states was seen at UC x 10 days ago for constipation and has been taking laxatives with little result Had BM this am but it was small and hard  Report right flank pain x 10 days as well  Denies any urinary symptoms at this time

## 2023-01-02 NOTE — ED Provider Notes (Signed)
[facility] Provider Note   CSN: [id number] Arrival date & time: 01/02/23  1558     History  Chief Complaint  Patient presents with   Constipation    [name] is a 72 y.o. female hx of hypertension, diabetes, COPD, A-fib on Eliquis, here presenting with constipation and right-sided abdominal pain.  Patient states that she has been having right-sided abdominal pain for about a week or so.  She went to urgent care a week ago and had x-rays that showed constipation.  Patient has been using stool softeners and does have some relief.  Patient states that she still has hard stools today and persistent right-sided abdominal pain so came here for further evaluation.  She states that while at urgent care, she had a urinalysis that was unremarkable.  No labs were drawn and patient did not have any ultrasound or CT scans of her abdomen  The history is provided by the patient.       Home Medications Prior to Admission medications   Medication Sig Start Date End Date Taking? Authorizing Provider  albuterol (PROVENTIL) (2.5 MG/3ML) 0.083% nebulizer solution Take 3 mLs (2.5 mg total) by nebulization every 6 (six) hours as needed for wheezing or shortness of breath. 08/17/16   Mesner, Barbara Cower, MD  albuterol (VENTOLIN HFA) 108 (90 Base) MCG/ACT inhaler Inhale 1 puff into the lungs every 6 (six) hours as needed for shortness of breath or wheezing. 06/22/21   [provider]  apixaban (ELIQUIS) 5 MG TABS tablet Take 1 tablet (5 mg total) by mouth 2 (two) times daily. 08/27/21   Meredeth Ide, MD  atorvastatin (LIPITOR) 40 MG tablet Take 40 mg by mouth daily. 05/17/17   [provider]  blood glucose meter kit and supplies KIT Dispense based on patient and insurance preference. Use up to four times daily as directed. 08/27/21   Meredeth Ide, MD  diltiazem (CARDIZEM CD) 180 MG 24 hr capsule Take 1 capsule (180 mg total) by mouth daily. 08/28/21    Meredeth Ide, MD  EPINEPHrine 0.3 mg/0.3 mL IJ SOAJ injection Inject 0.3 mg into the muscle as needed for anaphylaxis. 03/21/21   Pennie Banter, DO  Fluticasone-Umeclidin-Vilant (TRELEGY ELLIPTA) 100-62.5-25 MCG/ACT AEPB Inhale 1 puff into the lungs daily. 07/21/22   Lupita Leash, MD  gabapentin (NEURONTIN) 100 MG capsule Take 100 mg by mouth 3 (three) times daily as needed (pain). 03/12/21   [provider]  hydrochlorothiazide (HYDRODIURIL) 25 MG tablet TAKE 1 TABLET BY MOUTH ONCE DAILY IN THE MORNING 10/03/22   Tolia, Sunit, DO  ibuprofen (ADVIL) 200 MG tablet Take 200 mg by mouth every 6 (six) hours as needed.    [provider]  lidocaine (XYLOCAINE) 2 % solution Use as directed 15 mLs in the mouth or throat every 4 (four) hours as needed for mouth pain. 03/21/21   Pennie Banter, DO  loratadine (CLARITIN) 10 MG tablet Take 1 tablet (10 mg total) by mouth daily. 03/21/21   Pennie Banter, DO  losartan (COZAAR) 50 MG tablet Take 1 tablet (50 mg total) by mouth daily at 10 pm. 11/04/22 10/30/23  Odis Hollingshead, Sunit, DO  metFORMIN (GLUCOPHAGE) 500 MG tablet Take 1 tablet (500 mg total) by mouth 2 (two) times daily with a meal. 08/27/21 07/05/22  Meredeth Ide, MD  metoprolol tartrate (LOPRESSOR) 25 MG tablet Take 1 tablet by mouth twice daily 04/27/22   Odis Hollingshead, Sunit, DO  pantoprazole (PROTONIX) 40 MG tablet Take 40 mg by mouth 2 (two) times daily.    [provider]  potassium chloride (KLOR-CON) 10 MEQ tablet Take 1 tablet by mouth once daily 04/11/22   Tolia, Sunit, DO  ranitidine (ZANTAC) 150 MG tablet Take 1 tablet (150 mg total) by mouth 2 (two) times daily. Patient not taking: No sig reported 06/25/18 10/04/20  Parrett, Virgel Bouquetammy S, NP      Allergies    Azithromycin, Benazepril, Cephalexin, Benadryl [diphenhydramine hcl], Other, Nexium [esomeprazole magnesium], and Spiriva [tiotropium bromide monohydrate]    Review of Systems   Review of Systems  Gastrointestinal:   Positive for abdominal pain and constipation.  All other systems reviewed and are negative.   Physical Exam Updated Vital Signs BP (!) 161/68 (BP Location: Left Arm)   Pulse 64   Temp 97.7 F (36.5 C)   Resp 20   Ht 5\' 4"  (1.626 m)   Wt 81.6 kg   SpO2 100%   BMI 30.88 kg/m  Physical Exam Vitals and nursing note reviewed.  Constitutional:      Appearance: Normal appearance.  HENT:     Head: Normocephalic.     Nose: Nose normal.     Mouth/Throat:     Mouth: Mucous membranes are moist.  Eyes:     Extraocular Movements: Extraocular movements intact.     Pupils: Pupils are equal, round, and reactive to light.  Cardiovascular:     Rate and Rhythm: Normal rate and regular rhythm.     Pulses: Normal pulses.     Heart sounds: Normal heart sounds.  Pulmonary:     Effort: Pulmonary effort is normal.     Breath sounds: Normal breath sounds.  Abdominal:     General: Abdomen is flat.     Comments: Mild right upper quadrant and right CVA tenderness  Genitourinary:    Comments: Rectal- no obvious stool impaction, soft stools  Musculoskeletal:        General: Normal range of motion.     Cervical back: Normal range of motion and neck supple.  Skin:    General: Skin is warm.     Capillary Refill: Capillary refill takes less than 2 seconds.  Neurological:     General: No focal deficit present.     Mental Status: She is alert and oriented to person, place, and time.  Psychiatric:        Mood and Affect: Mood normal.        Behavior: Behavior normal.     ED Results / Procedures / Treatments   Labs (all labs ordered are listed, but only abnormal results are displayed) Labs Reviewed  CBC WITH DIFFERENTIAL/PLATELET  COMPREHENSIVE METABOLIC PANEL  URINALYSIS, ROUTINE W REFLEX MICROSCOPIC  LIPASE, BLOOD    EKG None  Radiology No results found.  Procedures Procedures    Medications Ordered in ED Medications  sodium chloride 0.9 % bolus 1,000 mL (has no administration  in time range)  ondansetron (ZOFRAN) injection 4 mg (has no administration in time range)    ED Course/ Medical Decision Making/ A&P                             Medical Decision Making Rosealee AlbeeBetty J Bjelland is a 72 y.o. female here presenting with right upper quadrant and right CVA tenderness and constipation.  While her right-sided abdominal pain could still be from constipation, I also considered biliary colic versus renal  colic.  Plan to get CBC and CMP and lipase and UA and abdominal ultrasound.  Will also get acute abdominal x-rays.  6:52 PM I reviewed patient's labs and independently interpreted imaging studies.  Labs showed sodium 127 and patient appears dehydrated so was given IV fluids.  Urinalysis was unremarkable.  X-ray showed constipation.  Ultrasound was unremarkable.  Since she is already on stool softeners and MiraLAX I we will prescribe GoLytely.  Told her that she should increase fiber in her diet and follow-up with GI.  Problems Addressed: Constipation, unspecified constipation type: acute illness or injury  Amount and/or Complexity of Data Reviewed Labs: ordered. Decision-making details documented in ED Course. Radiology: ordered and independent interpretation performed. Decision-making details documented in ED Course.  Risk Prescription drug management.    Final Clinical Impression(s) / ED Diagnoses Final diagnoses:  None    Rx / DC Orders ED Discharge Orders     None         Charlynne Pander, MD 01/02/23 [phone]

## 2023-01-02 NOTE — Discharge Instructions (Addendum)
You still are very constipated based on your x-ray.  I recommend taking GoLytely to help you have a bowel movement  You need to increase fiber in your diet.  Please stay hydrated  Please follow-up with GI  Return to ER if you have worse constipation, abdominal pain, vomiting

## 2023-01-03 ENCOUNTER — Ambulatory Visit: Payer: 59 | Admitting: Cardiology

## 2023-01-08 NOTE — Progress Notes (Signed)
Error.   No Show.   [name], [place], [facility]  Pager:  [phone] Office: [phone]

## 2023-01-20 ENCOUNTER — Ambulatory Visit: Payer: 59 | Admitting: Cardiology

## 2023-01-24 ENCOUNTER — Encounter: Payer: Self-pay | Admitting: Acute Care

## 2023-01-24 ENCOUNTER — Ambulatory Visit (INDEPENDENT_AMBULATORY_CARE_PROVIDER_SITE_OTHER): Payer: 59 | Admitting: Acute Care

## 2023-01-24 DIAGNOSIS — F17210 Nicotine dependence, cigarettes, uncomplicated: Secondary | ICD-10-CM

## 2023-01-24 NOTE — Patient Instructions (Signed)
Thank you for participating in the [facility] Scre[facility] Program. It was our pleasure to meet you today. We will call you with the results of your scan within the next few days. Your scan will be assigned a Lung RADS category score by the physicians reading the scans.  This Lung RADS score determines follow up scanning.  See below for description of categories, and follow up screening recommendations. We will be in touch to schedule your follow up screening annually or based on recommendations of our providers. We will fax a copy of your scan results to your Primary Care Physician, or the physician who referred you to the program, to ensure they have the results. Please call the office if you have any questions or concerns regarding your scanning experience or results.  Our office number is [phone]. Please speak with Denise Phelps, RN. , or  Denise Buckner RN, They are  our Lung Cancer Screening RN.'s If They are unavailable when you call, Please leave a message on the voice mail. We will return your call at our earliest convenience.This voice mail is monitored several times a day.  Remember, if your scan is normal, we will scan you annually as long as you continue to meet the criteria for the program. (Age 50-80, Current smoker or smoker who has quit within the last 15 years). If you are a smoker, remember, quitting is the single most powerful action that you can take to decrease your risk of lung cancer and other pulmonary, breathing related problems. We know quitting is hard, and we are here to help.  Please let us know if there is anything we can do to help you meet your goal of quitting. If you are a former smoker, congratulations. We are proud of you! Remain smoke free! Remember you can refer friends or family members through the number above.  We will screen them to make sure they meet criteria for the program. Thank you for helping us take better care of you by  participating in Lung Screening.  You can receive free nicotine replacement therapy ( patches, gum or mints) by calling 1-800-QUIT NOW. Please call so we can get you on the path to becoming  a non-smoker. I know it is hard, but you can do this!  Lung RADS Categories:  Lung RADS 1: no nodules or definitely non-concerning nodules.  Recommendation is for a repeat annual scan in 12 months.  Lung RADS 2:  nodules that are non-concerning in appearance and behavior with a very low likelihood of becoming an active cancer. Recommendation is for a repeat annual scan in 12 months.  Lung RADS 3: nodules that are probably non-concerning , includes nodules with a low likelihood of becoming an active cancer.  Recommendation is for a 6-month repeat screening scan. Often noted after an upper respiratory illness. We will be in touch to make sure you have no questions, and to schedule your 6-month scan.  Lung RADS 4 A: nodules with concerning findings, recommendation is most often for a follow up scan in 3 months or additional testing based on our provider's assessment of the scan. We will be in touch to make sure you have no questions and to schedule the recommended 3 month follow up scan.  Lung RADS 4 B:  indicates findings that are concerning. We will be in touch with you to schedule additional diagnostic testing based on our provider's  assessment of the scan.  Other options for assistance in smoking cessation (   As covered by your insurance benefits)  Hypnosis for smoking cessation  [employer] [phone]  Acupuncture for smoking cessation  [facility] [phone]   

## 2023-01-24 NOTE — Progress Notes (Addendum)
Virtual Visit via Telephone Note  I connected with [name] on 01/24/23 at  2:30 PM EDT by telephone and verified that I am speaking with the correct person using two identifiers.  Location: Patient: At home Provider: [address] [place], [place], [address]    I discussed the limitations, risks, security and privacy concerns of performing an evaluation and management service by telephone and the availability of in person appointments. I also discussed with the patient that there may be a patient responsible charge related to this service. The patient expressed understanding and agreed to proceed.   Shared Decision Making Visit Lung Cancer Screening Program [phone])   Eligibility: Age 72 y.o. Pack Years Smoking History Calculation 26 pack year smoking history (# packs/per year x # years smoked) Recent History of coughing up blood  no Unexplained weight loss? no ( >Than 15 pounds within the last 6 months ) Prior History Lung / other cancer no (Diagnosis within the last 5 years already requiring surveillance chest CT Scans). Smoking Status Current Smoker Former Smokers: Years since quit:  NA  Quit Date:  NA  Visit Components: Discussion included one or more decision making aids. yes Discussion included risk/benefits of screening. yes Discussion included potential follow up diagnostic testing for abnormal scans. yes Discussion included meaning and risk of over diagnosis. yes Discussion included meaning and risk of False Positives. yes Discussion included meaning of total radiation exposure. yes  Counseling Included: Importance of adherence to annual lung cancer LDCT screening. yes Impact of comorbidities on ability to participate in the program. yes Ability and willingness to under diagnostic treatment. yes  Smoking Cessation Counseling: Current Smokers:  Discussed importance of smoking cessation. yes Information about tobacco cessation classes and interventions  provided to patient. yes Patient provided with "ticket" for LDCT Scan. yes Symptomatic Patient. no  Counseling NA Diagnosis Code: Tobacco Use Z72.0 Asymptomatic Patient yes  Counseling (Intermediate counseling: > three minutes counseling) [id number] Former Smokers:  Discussed the importance of maintaining cigarette abstinence. yes Diagnosis Code: Personal History of Nicotine Dependence. U98.119 Information about tobacco cessation classes and interventions provided to patient. Yes Patient provided with "ticket" for LDCT Scan. yes Written Order for Lung Cancer Screening with LDCT placed in Epic. Yes (CT Chest Lung Cancer Screening Low Dose W/O CM) [id number] Z12.2-Screening of respiratory organs Z87.891-Personal history of nicotine dependence  I have spent 25 minutes of face to face/ virtual visit   time with  Ms. Vrooman discussing the risks and benefits of lung cancer screening. We viewed / discussed a power point together that explained in detail the above noted topics. We paused at intervals to allow for questions to be asked and answered to ensure understanding.We discussed that the single most powerful action that she can take to decrease her risk of developing lung cancer is to quit smoking. We discussed whether or not she is ready to commit to setting a quit date. We discussed options for tools to aid in quitting smoking including nicotine replacement therapy, non-nicotine medications, support groups, Quit Smart classes, and behavior modification. We discussed that often times setting smaller, more achievable goals, such as eliminating 1 cigarette a day for a week and then 2 cigarettes a day for a week can be helpful in slowly decreasing the number of cigarettes smoked. This allows for a sense of accomplishment as well as providing a clinical benefit. I provided  her  with smoking cessation  information  with contact information for community resources, classes, free  nicotine replacement therapy, and  access to mobile apps, text messaging, and on-line smoking cessation help. I have also provided  her  the office contact information in the event she needs to contact me, or the screening staff. We discussed the time and location of the scan, and that either Abigail Miyamoto RN, Karlton Lemon, RN  or I will call / send a letter with the results within 24-72 hours of receiving them. The patient verbalized understanding of all of  the above and had no further questions upon leaving the office. They have my contact information in the event they have any further questions.  I spent 3 minutes counseling on smoking cessation and the health risks of continued tobacco abuse.  I explained to the patient that there has been a high incidence of coronary artery disease noted on these exams. I explained that this is a non-gated exam therefore degree or severity cannot be determined. This patient is on statin therapy, and is followed by cardiology. I have asked the patient to follow-up with their PCP regarding any incidental finding of coronary artery disease and management with diet or medication as their PCP  feels is clinically indicated. The patient verbalized understanding of the above and had no further questions upon completion of the visit.      Bevelyn Ngo, NP 01/24/2023

## 2023-01-27 ENCOUNTER — Encounter: Payer: Self-pay | Admitting: Cardiology

## 2023-01-27 ENCOUNTER — Ambulatory Visit: Payer: 59 | Admitting: Cardiology

## 2023-01-27 VITALS — BP 162/74 | HR 62 | Resp 18 | Ht 64.0 in | Wt 175.8 lb

## 2023-01-27 DIAGNOSIS — E119 Type 2 diabetes mellitus without complications: Secondary | ICD-10-CM

## 2023-01-27 DIAGNOSIS — F17210 Nicotine dependence, cigarettes, uncomplicated: Secondary | ICD-10-CM

## 2023-01-27 DIAGNOSIS — J449 Chronic obstructive pulmonary disease, unspecified: Secondary | ICD-10-CM

## 2023-01-27 DIAGNOSIS — Z9289 Personal history of other medical treatment: Secondary | ICD-10-CM

## 2023-01-27 DIAGNOSIS — Z7901 Long term (current) use of anticoagulants: Secondary | ICD-10-CM

## 2023-01-27 DIAGNOSIS — I10 Essential (primary) hypertension: Secondary | ICD-10-CM

## 2023-01-27 DIAGNOSIS — E782 Mixed hyperlipidemia: Secondary | ICD-10-CM

## 2023-01-27 DIAGNOSIS — I480 Paroxysmal atrial fibrillation: Secondary | ICD-10-CM

## 2023-01-27 MED ORDER — CHLORTHALIDONE 25 MG PO TABS
25.0000 mg | ORAL_TABLET | Freq: Every morning | ORAL | 3 refills | Status: DC
Start: 2023-01-27 — End: 2023-12-04

## 2023-01-27 NOTE — Progress Notes (Signed)
Date:  01/27/2023   ID:  [name], DOB 03/07/1951, MRN [id number]  Date: 01/27/23 Last Office Visit: 07/05/2022  Chief Complaint  Patient presents with   Atrial Fibrillation   Follow-up    HPI  [name] is a 72 y.o. African-American female whose past medical history and cardiovascular risk factors include: Paroxysmal atrial fibrillation status post TEE guided cardioversion (08/2021), COPD, tobacco use, diet-controlled type II diabetes, hypertension, hyperlipidemia, rheumatoid arthritis, GERD .    During her COPD exacerbation hospitalization in November 2022 she was diagnosed with new onset of A-fib with RVR.  Despite parenteral medication she did not convert and therefore underwent TEE guided cardioversion and since then has remained in sinus rhythm.  She presents today for a 79-month follow-up visit.  Denies chest pain or heart failure symptoms.  Unfortunately, her PCP is no longer there and she is currently looking for a new PCP.  She also ran out of her hydrochlorothiazide which may be contributing to her uncontrolled hypertension.  She is requesting assistance with blood pressure management until she establishes care with PCP.  She smokes 1 pack every 1.5 weeks.  But is motivated to quit.  FUNCTIONAL STATUS: No structured exercise program or daily routine.   ALLERGIES: Allergies  Allergen Reactions   Azithromycin Anaphylaxis and Swelling    Eyes swell   Benazepril Anaphylaxis    Other reaction(s): SWELLING   Cephalexin Anaphylaxis and Swelling   Benadryl [Diphenhydramine Hcl] Nausea And Vomiting   Other Other (See Comments)    Reports allergy to insects. Unsure of reaction.   Nexium [Esomeprazole Magnesium] Palpitations   Spiriva [Tiotropium Bromide Monohydrate] Palpitations    MEDICATION LIST PRIOR TO VISIT: Current Meds  Medication Sig   albuterol (PROVENTIL) (2.5 MG/3ML) 0.083% nebulizer solution Take 3 mLs (2.5 mg total) by nebulization every 6 (six)  hours as needed for wheezing or shortness of breath.   albuterol (VENTOLIN HFA) 108 (90 Base) MCG/ACT inhaler Inhale 1 puff into the lungs every 6 (six) hours as needed for shortness of breath or wheezing.   apixaban (ELIQUIS) 5 MG TABS tablet Take 1 tablet (5 mg total) by mouth 2 (two) times daily.   atorvastatin (LIPITOR) 40 MG tablet Take 40 mg by mouth daily.   blood glucose meter kit and supplies KIT Dispense based on patient and insurance preference. Use up to four times daily as directed.   chlorthalidone (HYGROTON) 25 MG tablet Take 1 tablet (25 mg total) by mouth every morning.   diltiazem (CARDIZEM CD) 180 MG 24 hr capsule Take 1 capsule (180 mg total) by mouth daily.   EPINEPHrine 0.3 mg/0.3 mL IJ SOAJ injection Inject 0.3 mg into the muscle as needed for anaphylaxis.   Fluticasone-Umeclidin-Vilant (TRELEGY ELLIPTA) 100-62.5-25 MCG/ACT AEPB Inhale 1 puff into the lungs daily.   ibuprofen (ADVIL) 200 MG tablet Take 200 mg by mouth every 6 (six) hours as needed.   losartan (COZAAR) 100 MG tablet Take 100 mg by mouth every evening.   metFORMIN (GLUCOPHAGE) 500 MG tablet Take 1 tablet (500 mg total) by mouth 2 (two) times daily with a meal.   metoprolol tartrate (LOPRESSOR) 25 MG tablet Take 1 tablet by mouth twice daily   pantoprazole (PROTONIX) 40 MG tablet Take 40 mg by mouth 2 (two) times daily.   potassium chloride (KLOR-CON) 10 MEQ tablet Take 1 tablet by mouth once daily   [DISCONTINUED] hydrochlorothiazide (HYDRODIURIL) 25 MG tablet TAKE 1 TABLET BY MOUTH ONCE DAILY IN THE MORNING   [  DISCONTINUED] losartan (COZAAR) 50 MG tablet Take 1 tablet (50 mg total) by mouth daily at 10 pm. (Patient taking differently: Take 100 mg by mouth daily at 10 pm.)   Current Facility-Administered Medications for the 01/27/23 encounter (Office Visit) with Tessa Lerner, DO  Medication   ipratropium-albuterol (DUONEB) 0.5-2.5 (3) MG/3ML nebulizer solution 3 mL     PAST MEDICAL HISTORY: Past Medical  History:  Diagnosis Date   Arthritis    Asthma    COPD (chronic obstructive pulmonary disease) (HCC)    Diabetes mellitus    Heart murmur    High cholesterol    Hyperlipidemia    Hypertension    Kidney infection    Kidney stone    Reflux     PAST SURGICAL HISTORY: Past Surgical History:  Procedure Laterality Date   BUBBLE STUDY  08/26/2021   Procedure: BUBBLE STUDY;  Surgeon: Tessa Lerner, DO;  Location: [facility];  Service: Cardiovascular;;   CARDIOVERSION N/A 08/26/2021   Procedure: CARDIOVERSION;  Surgeon: Tessa Lerner, DO;  Location: [facility];  Service: Cardiovascular;  Laterality: N/A;   fillopian tube removal     right breast cyst removal     TEE WITHOUT CARDIOVERSION N/A 08/26/2021   Procedure: TRANSESOPHAGEAL ECHOCARDIOGRAM (TEE);  Surgeon: Tessa Lerner, DO;  Location: [facility];  Service: Cardiovascular;  Laterality: N/A;    FAMILY HISTORY: The patient family history includes CVA in an other family member; Diabetes in her father; Diabetes Mellitus II in an other family member; Hypertension in her sister and another family member; Stroke in her mother.  SOCIAL HISTORY:  The patient  reports that she has been smoking cigarettes. She started smoking about 56 years ago. She has a 26.00 pack-year smoking history. Her smokeless tobacco use includes chew. She reports that she does not currently use alcohol. She reports that she does not use drugs.  REVIEW OF SYSTEMS: Review of Systems  Cardiovascular:  Negative for chest pain, claudication, dyspnea on exertion, irregular heartbeat, leg swelling, near-syncope, orthopnea, palpitations, paroxysmal nocturnal dyspnea and syncope.  Respiratory:  Negative for shortness of breath.   Hematologic/Lymphatic: Negative for bleeding problem.  Musculoskeletal:  Negative for muscle cramps and myalgias.  Neurological:  Negative for dizziness and light-headedness.    PHYSICAL EXAM:    01/27/2023    9:10 AM 01/02/2023    7:15 PM  01/02/2023    7:00 PM  Vitals with BMI  Height 5\' 4"     Weight 175 lbs 13 oz    BMI 30.16    Systolic 162 168 161  Diastolic 74 75 81  Pulse 62 49 49   Physical Exam  Constitutional: No distress.  Age appropriate, hemodynamically stable.   Neck: No JVD present.  Cardiovascular: Normal rate, regular rhythm, S1 normal, S2 normal, intact distal pulses and normal pulses. Exam reveals no gallop, no S3 and no S4.  No murmur heard. Pulmonary/Chest: Effort normal and breath sounds normal. No stridor. She has no wheezes. She has no rales.  Abdominal: Soft. Bowel sounds are normal. She exhibits no distension. There is no abdominal tenderness.  Musculoskeletal:        General: No edema.     Cervical back: Neck supple.  Neurological: She is alert and oriented to person, place, and time. She has intact cranial nerves (2-12).  Skin: Skin is warm and moist.    CARDIAC DATABASE: EKG: 10/07/2021: Normal sinus rhythm, 60 bpm, without underlying ischemia or injury pattern.  12/28/2021: Sinus rhythm at a rate of 50  bpm   07/05/2022: Sinus bradycardia, 56 bpm, without underlying ischemia injury pattern  01/27/2023: Sinus Bradycardia, 55bpm, without underlying injury pattern.   Echocardiogram: 08/23/2021: LVEF 55-60%, normal wall motion, right ventricular size and function normal, trivial MR, global longitudinal strain -20.6%  06/29/2022 [facility] health outside records provided by the patient): LVEF 55-60%, grade 2 diastolic dysfunction, elevated LAP, mildly dilated left atrium, mild TR.   Stress Testing: Exercise Myoview stress test 12/28/2021: Exercise nuclear stress test was performed using Bruce protocol. 1 Day Rest/Stress Protocol. Exercise time 4 minutes 10 seconds, 6.04 METS, 88%APMHR.  Stress ECG negative for ischemia.  Normal myocardial perfusion with diaphragmatic attenuation artifact.  No convincing evidence of reversible myocardial ischemia or prior infarct. Left ventricular  size is normal, LVEF visually preserved.  Low risk study.  Heart Catheterization: None  TEE guided cardioversion: 08/26/2021: 200 J x 1 restored to sinus rhythm  LABORATORY DATA:    Latest Ref Rng & Units 01/02/2023    4:32 PM 06/11/2022   10:35 PM 08/27/2021    2:30 AM  CBC  WBC 4.0 - 10.5 K/uL 9.2  9.9  15.4   Hemoglobin 12.0 - 15.0 g/dL 11.9  14.7  82.9   Hematocrit 36.0 - 46.0 % 35.2  33.9  42.2   Platelets 150 - 400 K/uL 249  219  255        Latest Ref Rng & Units 01/02/2023    4:32 PM 06/11/2022   10:35 PM 10/20/2021    2:55 PM  CMP  Glucose 70 - 99 mg/dL 562  84  69   BUN 8 - 23 mg/dL 12  21  19    Creatinine 0.44 - 1.00 mg/dL 1.30  8.65  7.84   Sodium 135 - 145 mmol/L 127  135  142   Potassium 3.5 - 5.1 mmol/L 3.1  3.4  3.4   Chloride 98 - 111 mmol/L 88  97  97   CO2 22 - 32 mmol/L 29  31  30    Calcium 8.9 - 10.3 mg/dL 9.7  9.2  69.6   Total Protein 6.5 - 8.1 g/dL 7.2  6.5    Total Bilirubin 0.3 - 1.2 mg/dL 0.7  0.6    Alkaline Phos 38 - 126 U/L 62  48    AST 15 - 41 U/L 18  13    ALT 0 - 44 U/L 12  8      Lipid Panel  No results found for: "CHOL", "TRIG", "HDL", "CHOLHDL", "VLDL", "LDLCALC", "LDLDIRECT", "LABVLDL"  No components found for: "NTPROBNP" No results for input(s): "PROBNP" in the last 8760 hours. No results for input(s): "TSH" in the last 8760 hours.   BMP Recent Labs    06/11/22 2235 01/02/23 1632  NA 135 127*  K 3.4* 3.1*  CL 97* 88*  CO2 31 29  GLUCOSE 84 126*  BUN 21 12  CREATININE 0.98 0.96  CALCIUM 9.2 9.7  GFRNONAA >60 >60    HEMOGLOBIN A1C Lab Results  Component Value Date   HGBA1C 7.3 (H) 08/23/2021   MPG 163 08/23/2021    External Labs: Collected: [facility] 04/11/2022 available in Care Everywhere Hemoglobin 11.9 g/dL, hematocrit 29% Sodium 142, potassium 3.7, chloride 102, bicarb 37, BUN 19, creatinine 1.34. AST 11, ALT 8, alkaline phosphatase 65. eGFR 43  IMPRESSION:     ICD-10-CM   1. Paroxysmal A-fib (HCC)  I48.0 EKG 12-Lead    2. Long term (current) use of  anticoagulants  Z79.01 Hemoglobin and hematocrit, blood    3. History of cardioversion  Z92.89     4. Benign hypertension  I10 chlorthalidone (HYGROTON) 25 MG tablet    Basic metabolic panel    Magnesium    5. Mixed hyperlipidemia  E78.2     6. Non-insulin dependent type 2 diabetes mellitus (HCC)  E11.9     7. Chronic obstructive pulmonary disease, unspecified COPD type (HCC)  J44.9     8. Cigarette smoker  F17.210        RECOMMENDATIONS: [name] is a 72 y.o. African-American female whose past medical history and cardiac risk factors include: Paroxysmal atrial fibrillation status post TEE guided cardioversion (08/2021), COPD, tobacco use, diet-controlled type II diabetes, hypertension, hyperlipidemia, rheumatoid arthritis, GERD .    Paroxysmal A-fib (HCC) Rate control: Cardizem, Lopressor  Rhythm control: N/A. Thromboembolic prophylaxis: Eliquis Status post TEE guided cardioversion 08/26/2021 CHA2DS2-VASc SCORE is 4 which correlates to 4% risk of stroke per year (HTN, age, diabetes, gender) Discussed discontinuation of Lopressor and uptitrating Cardizem; however, patient would like to continue the same medical regimen since its kept her out of Afib.   Long term (current) use of anticoagulants Indication: Paroxysmal atrial fibrillation and cardioversion 08/26/2021 Does not endorse evidence of bleeding Risks, benefits, and alternatives to anticoagulation discussed. Check H&H.  Benign hypertension Office blood pressures are not well controlled. She has been off of her hydrochlorothiazide due to no refills.  She is requesting assistance with blood pressure management until she finds a new PCP.  Stop hydrochlorothiazide 25 mg p.o. daily. Start chlorthalidone 25 mg p.o. every morning. Labs in 1 week to evaluate kidney function and electrolytes. I have asked her to check her blood  pressures regularly.  She will call the office if her SBP remains >130 mmHg consistently.  Mixed hyperlipidemia Currently on atorvastatin.   She denies myalgia or other side effects. Currently managed by primary care provider.   Non-insulin dependent type 2 diabetes mellitus (HCC) Currently managed by PCP. Continue losartan, statin therapy Educated in the importance of glycemic control given her other cardiovascular risk factors.  FINAL MEDICATION LIST END OF ENCOUNTER: Meds ordered this encounter  Medications   chlorthalidone (HYGROTON) 25 MG tablet    Sig: Take 1 tablet (25 mg total) by mouth every morning.    Dispense:  90 tablet    Refill:  3    Medications Discontinued During This Encounter  Medication Reason   gabapentin (NEURONTIN) 100 MG capsule    lidocaine (XYLOCAINE) 2 % solution    loratadine (CLARITIN) 10 MG tablet    losartan (COZAAR) 50 MG tablet    hydrochlorothiazide (HYDRODIURIL) 25 MG tablet      Current Outpatient Medications:    albuterol (PROVENTIL) (2.5 MG/3ML) 0.083% nebulizer solution, Take 3 mLs (2.5 mg total) by nebulization every 6 (six) hours as needed for wheezing or shortness of breath., Disp: 75 mL, Rfl: 12   albuterol (VENTOLIN HFA) 108 (90 Base) MCG/ACT inhaler, Inhale 1 puff into the lungs every 6 (six) hours as needed for shortness of breath or wheezing., Disp: , Rfl:    apixaban (ELIQUIS) 5 MG TABS tablet, Take 1 tablet (5 mg total) by mouth 2 (two) times daily., Disp: 60 tablet, Rfl: 2   atorvastatin (LIPITOR) 40 MG tablet, Take 40 mg by mouth daily., Disp: , Rfl:    blood glucose meter kit and supplies KIT, Dispense based on patient and insurance preference. Use up to four times daily as  directed., Disp: 1 each, Rfl: 0   chlorthalidone (HYGROTON) 25 MG tablet, Take 1 tablet (25 mg total) by mouth every morning., Disp: 90 tablet, Rfl: 3   diltiazem (CARDIZEM CD) 180 MG 24 hr capsule, Take 1 capsule (180 mg total) by mouth daily., Disp: 30  capsule, Rfl: 3   EPINEPHrine 0.3 mg/0.3 mL IJ SOAJ injection, Inject 0.3 mg into the muscle as needed for anaphylaxis., Disp: 1 each, Rfl: 1   Fluticasone-Umeclidin-Vilant (TRELEGY ELLIPTA) 100-62.5-25 MCG/ACT AEPB, Inhale 1 puff into the lungs daily., Disp: 60 each, Rfl: 5   ibuprofen (ADVIL) 200 MG tablet, Take 200 mg by mouth every 6 (six) hours as needed., Disp: , Rfl:    losartan (COZAAR) 100 MG tablet, Take 100 mg by mouth every evening., Disp: , Rfl:    metFORMIN (GLUCOPHAGE) 500 MG tablet, Take 1 tablet (500 mg total) by mouth 2 (two) times daily with a meal., Disp: 30 tablet, Rfl: 11   metoprolol tartrate (LOPRESSOR) 25 MG tablet, Take 1 tablet by mouth twice daily, Disp: 60 tablet, Rfl: 0   pantoprazole (PROTONIX) 40 MG tablet, Take 40 mg by mouth 2 (two) times daily., Disp: , Rfl:    potassium chloride (KLOR-CON) 10 MEQ tablet, Take 1 tablet by mouth once daily, Disp: 30 tablet, Rfl: 0  Current Facility-Administered Medications:    ipratropium-albuterol (DUONEB) 0.5-2.5 (3) MG/3ML nebulizer solution 3 mL, 3 mL, Nebulization, Q6H PRN, Lupita Leash, MD  Orders Placed This Encounter  Procedures   Basic metabolic panel   Magnesium   Hemoglobin and hematocrit, blood   EKG 12-Lead   There are no Patient Instructions on file for this visit.   --Continue cardiac medications as reconciled in final medication list. --Return in about 6 months (around 07/30/2023) for Follow up, A. fib. Or sooner if needed. --Continue follow-up with your primary care physician regarding the management of your other chronic comorbid conditions.  Patient's questions and concerns were addressed to her satisfaction. She voices understanding of the instructions provided during this encounter.   This note was created using a voice recognition software as a result there may be grammatical errors inadvertently enclosed that do not reflect the nature of this encounter. Every attempt is made to correct such  errors.  Tessa Lerner, [place], [facility]  Pager:  [phone] Office: [phone]

## 2023-02-01 LAB — BASIC METABOLIC PANEL
BUN/Creatinine Ratio: 18 (ref 12–28)
BUN: 16 mg/dL (ref 8–27)
CO2: 26 mmol/L (ref 20–29)
Calcium: 10.6 mg/dL — ABNORMAL HIGH (ref 8.7–10.3)
Chloride: 98 mmol/L (ref 96–106)
Creatinine, Ser: 0.91 mg/dL (ref 0.57–1.00)
Glucose: 76 mg/dL (ref 70–99)
Potassium: 4.4 mmol/L (ref 3.5–5.2)
Sodium: 142 mmol/L (ref 134–144)
eGFR: 68 mL/min/{1.73_m2} (ref >59)

## 2023-02-01 LAB — HEMOGLOBIN AND HEMATOCRIT, BLOOD
Hematocrit: 36.9 % (ref 34.0–46.6)
Hemoglobin: 12 g/dL (ref 11.1–15.9)

## 2023-02-01 NOTE — Progress Notes (Signed)
Called and spoke with patient regarding her recent lab results.

## 2023-05-19 ENCOUNTER — Encounter (HOSPITAL_BASED_OUTPATIENT_CLINIC_OR_DEPARTMENT_OTHER): Payer: Self-pay

## 2023-05-19 ENCOUNTER — Emergency Department (HOSPITAL_BASED_OUTPATIENT_CLINIC_OR_DEPARTMENT_OTHER): Payer: 59

## 2023-05-19 ENCOUNTER — Other Ambulatory Visit: Payer: Self-pay

## 2023-05-19 ENCOUNTER — Emergency Department (HOSPITAL_BASED_OUTPATIENT_CLINIC_OR_DEPARTMENT_OTHER)
Admission: EM | Admit: 2023-05-19 | Discharge: 2023-05-19 | Disposition: A | Discharge status: Home / Self Care | Payer: 59 | Attending: Emergency Medicine | Admitting: Emergency Medicine

## 2023-05-19 ENCOUNTER — Other Ambulatory Visit (HOSPITAL_BASED_OUTPATIENT_CLINIC_OR_DEPARTMENT_OTHER): Payer: Self-pay

## 2023-05-19 DIAGNOSIS — Z79899 Other long term (current) drug therapy: Secondary | ICD-10-CM | POA: Insufficient documentation

## 2023-05-19 DIAGNOSIS — E119 Type 2 diabetes mellitus without complications: Secondary | ICD-10-CM | POA: Diagnosis not present

## 2023-05-19 DIAGNOSIS — I10 Essential (primary) hypertension: Secondary | ICD-10-CM | POA: Diagnosis not present

## 2023-05-19 DIAGNOSIS — Z7984 Long term (current) use of oral hypoglycemic drugs: Secondary | ICD-10-CM | POA: Diagnosis not present

## 2023-05-19 DIAGNOSIS — Z7901 Long term (current) use of anticoagulants: Secondary | ICD-10-CM | POA: Insufficient documentation

## 2023-05-19 DIAGNOSIS — J441 Chronic obstructive pulmonary disease with (acute) exacerbation: Secondary | ICD-10-CM | POA: Diagnosis not present

## 2023-05-19 DIAGNOSIS — R0602 Shortness of breath: Secondary | ICD-10-CM | POA: Diagnosis present

## 2023-05-19 DIAGNOSIS — Z7951 Long term (current) use of inhaled steroids: Secondary | ICD-10-CM | POA: Insufficient documentation

## 2023-05-19 MED ORDER — DOXYCYCLINE HYCLATE 100 MG PO CAPS
100.0000 mg | ORAL_CAPSULE | Freq: Two times a day (BID) | ORAL | 0 refills | Status: AC
  Filled 2023-05-19: qty 14, 7d supply, fill #0

## 2023-05-19 MED ORDER — IPRATROPIUM-ALBUTEROL 0.5-2.5 (3) MG/3ML IN SOLN
3.0000 mL | Freq: Once | RESPIRATORY_TRACT | Status: AC
  Administered 2023-05-19: 3 mL via RESPIRATORY_TRACT
  Filled 2023-05-19: qty 3

## 2023-05-19 MED ORDER — PREDNISONE 20 MG PO TABS
40.0000 mg | ORAL_TABLET | Freq: Every day | ORAL | 0 refills | Status: AC
  Filled 2023-05-19: qty 8, 4d supply, fill #0

## 2023-05-19 MED ORDER — PREDNISONE 50 MG PO TABS
60.0000 mg | ORAL_TABLET | Freq: Once | ORAL | Status: AC
  Administered 2023-05-19: 60 mg via ORAL
  Filled 2023-05-19: qty 1

## 2023-05-19 NOTE — ED Triage Notes (Signed)
Pt reports to ED with complaints of SOB and coughing x 2 weeks. States that she has been coughing up mucous. Denies pain, denies fever

## 2023-05-19 NOTE — ED Notes (Signed)
ED Provider at bedside. 

## 2023-05-19 NOTE — ED Notes (Signed)
   05/19/23 0753  Respiratory Assessment  $ RT Protocol Assessment  Yes  Assessment Type Pre-treatment  Respiratory Pattern Regular;Labored;Symmetrical;Dyspnea with exertion  Chest Assessment Chest expansion symmetrical  Cough Productive  Sputum Color Yellow  Bilateral Breath Sounds Diminished  Oxygen Therapy/Pulse Ox  O2 Therapy Room air  SpO2 98 % (@ rest)   Ambulated from registration to room 11, SpO2 >95%, endorses mild increased WOB with exertion.  Productive cough with yellow secretions.

## 2023-05-19 NOTE — ED Provider Notes (Signed)
[facility] [facility] Provider Note   CSN: [id number] Arrival date & time: 05/19/23  8657     History  Chief Complaint  Patient presents with   Shortness of Breath    [name] is a 72 y.o. female.  Patient here productive cough for the last 2 weeks.  COPD symptoms.  Breathing treatments at home helping some.  Denies any chest pain fever chills.  No leg swelling.  No exertional chest pain.  History of diabetes, hypertension.  She feels like she has a lot of mucus that she cannot get up.  The history is provided by the patient.       Home Medications Prior to Admission medications   Medication Sig Start Date End Date Taking? Authorizing Provider  doxycycline (VIBRAMYCIN) 100 MG capsule Take 1 capsule (100 mg total) by mouth 2 (two) times daily for 7 days. 05/19/23 05/26/23 Yes Donneisha Beane, DO  predniSONE (DELTASONE) 20 MG tablet Take 2 tablets (40 mg total) by mouth daily for 4 days. 05/19/23 05/23/23 Yes Sumire Halbleib, DO  albuterol (PROVENTIL) (2.5 MG/3ML) 0.083% nebulizer solution Take 3 mLs (2.5 mg total) by nebulization every 6 (six) hours as needed for wheezing or shortness of breath. 08/17/16   Mesner, Barbara Cower, MD  albuterol (VENTOLIN HFA) 108 (90 Base) MCG/ACT inhaler Inhale 1 puff into the lungs every 6 (six) hours as needed for shortness of breath or wheezing. 06/22/21   [provider]  apixaban (ELIQUIS) 5 MG TABS tablet Take 1 tablet (5 mg total) by mouth 2 (two) times daily. 08/27/21   Meredeth Ide, MD  atorvastatin (LIPITOR) 40 MG tablet Take 40 mg by mouth daily. 05/17/17   [provider]  blood glucose meter kit and supplies KIT Dispense based on patient and insurance preference. Use up to four times daily as directed. 08/27/21   Meredeth Ide, MD  chlorthalidone (HYGROTON) 25 MG tablet Take 1 tablet (25 mg total) by mouth every morning. 01/27/23 04/27/23  Tolia, Sunit, DO  diltiazem (CARDIZEM CD) 180 MG 24 hr  capsule Take 1 capsule (180 mg total) by mouth daily. 08/28/21   Meredeth Ide, MD  EPINEPHrine 0.3 mg/0.3 mL IJ SOAJ injection Inject 0.3 mg into the muscle as needed for anaphylaxis. 03/21/21   Pennie Banter, DO  Fluticasone-Umeclidin-Vilant (TRELEGY ELLIPTA) 100-62.5-25 MCG/ACT AEPB Inhale 1 puff into the lungs daily. 07/21/22   Lupita Leash, MD  ibuprofen (ADVIL) 200 MG tablet Take 200 mg by mouth every 6 (six) hours as needed.    [provider]  losartan (COZAAR) 100 MG tablet Take 100 mg by mouth every evening.    [provider]  metFORMIN (GLUCOPHAGE) 500 MG tablet Take 1 tablet (500 mg total) by mouth 2 (two) times daily with a meal. 08/27/21 01/27/23  Meredeth Ide, MD  metoprolol tartrate (LOPRESSOR) 25 MG tablet Take 1 tablet by mouth twice daily 04/27/22   Tolia, Sunit, DO  pantoprazole (PROTONIX) 40 MG tablet Take 40 mg by mouth 2 (two) times daily.    [provider]  potassium chloride (KLOR-CON) 10 MEQ tablet Take 1 tablet by mouth once daily 04/11/22   Tolia, Sunit, DO  ranitidine (ZANTAC) 150 MG tablet Take 1 tablet (150 mg total) by mouth 2 (two) times daily. Patient not taking: No sig reported 06/25/18 10/04/20  Parrett, Virgel Bouquet, NP      Allergies    Azithromycin, Benazepril, Cephalexin, Benadryl [diphenhydramine hcl], Other,  Nexium [esomeprazole magnesium], and Spiriva [tiotropium bromide monohydrate]    Review of Systems   Review of Systems  Physical Exam Updated Vital Signs BP (!) 175/104 (BP Location: Left Arm)   Pulse 60   Temp 98.1 F (36.7 C) (Oral)   Resp 20   Ht 5\' 5"  (1.651 m)   Wt 77.1 kg   SpO2 100%   BMI 28.29 kg/m  Physical Exam Vitals and nursing note reviewed.  Constitutional:      General: She is not in acute distress.    Appearance: She is well-developed. She is not ill-appearing.  HENT:     Head: Normocephalic and atraumatic.     Mouth/Throat:     Mouth: Mucous membranes are moist.  Eyes:      Conjunctiva/sclera: Conjunctivae normal.     Pupils: Pupils are equal, round, and reactive to light.  Cardiovascular:     Rate and Rhythm: Normal rate and regular rhythm.     Pulses: Normal pulses.     Heart sounds: Normal heart sounds. No murmur heard. Pulmonary:     Effort: Pulmonary effort is normal. No respiratory distress.     Breath sounds: Wheezing present.  Abdominal:     Palpations: Abdomen is soft.     Tenderness: There is no abdominal tenderness.  Musculoskeletal:        General: No swelling.     Cervical back: Normal range of motion and neck supple.  Skin:    General: Skin is warm and dry.     Capillary Refill: Capillary refill takes less than 2 seconds.  Neurological:     General: No focal deficit present.     Mental Status: She is alert.  Psychiatric:        Mood and Affect: Mood normal.     ED Results / Procedures / Treatments   Labs (all labs ordered are listed, but only abnormal results are displayed) Labs Reviewed - No data to display  EKG EKG Interpretation Date/Time:  Friday May 19 2023 07:56:17 EDT Ventricular Rate:  52 PR Interval:  172 QRS Duration:  84 QT Interval:  445 QTC Calculation: 414 R Axis:   69  Text Interpretation: Sinus rhythm Confirmed by Virgina Norfolk [phone]) on 05/19/2023 8:29:22 AM  Radiology DG Chest Portable 1 View  Result Date: 05/19/2023 CLINICAL DATA:  cough.  Shortness of breath. EXAM: PORTABLE CHEST 1 VIEW COMPARISON:  01/02/2023. FINDINGS: Bilateral lung fields are clear. Bilateral lateral costophrenic angles are clear. Normal cardio-mediastinal silhouette. No acute osseous abnormalities. The soft tissues are within normal limits. IMPRESSION: No active disease. Electronically Signed   By: Jules Schick M.D.   On: 05/19/2023 08:25    Procedures Procedures    Medications Ordered in ED Medications  ipratropium-albuterol (DUONEB) 0.5-2.5 (3) MG/3ML nebulizer solution 3 mL (3 mLs Nebulization Given 05/19/23 0757)   predniSONE (DELTASONE) tablet 60 mg (60 mg Oral Given 05/19/23 0757)    ED Course/ Medical Decision Making/ A&P                                 Medical Decision Making Amount and/or Complexity of Data Reviewed Radiology: ordered.  Risk Prescription drug management.   Betzayda Kachur Xin is here with cough, shortness of breath.  Hypertensive otherwise unremarkable vitals.  No signs of respiratory distress.  No chest pain.  Little bit of wheezing on exam.  She has had a bad cough with some  production for the last week or 2.  Home nebulizer and inhaler not helping much.  Patient able to ambulate back to the room with normal pulse ox.  Differential diagnosis likely bronchitis/COPD exacerbation.  EKG shows sinus rhythm.  No ischemic changes.  She has no chest pain.  Have no concern for ACS.  I have no concern for PE or other acute process at this time.  Supposedly could be viral process/pneumonia as well and will get chest x-ray.  Will give breathing treatment and prednisone and reevaluate.  Very well-appearing.  Patient on reevaluation feels much improved.  Chest x-ray with no obvious pneumonia.  However will treat with short course of doxycycline.  She is allergic to Z-Pak.  Will take next dose of steroid tomorrow.  Understands return precautions.  Recommend follow-up with primary care doctor.  Discharged in good condition.  This chart was dictated using voice recognition software.  Despite best efforts to proofread,  errors can occur which can change the documentation meaning.         Final Clinical Impression(s) / ED Diagnoses Final diagnoses:  COPD exacerbation (HCC)    Rx / DC Orders ED Discharge Orders          Ordered    predniSONE (DELTASONE) 20 MG tablet  Daily        05/19/23 0833    doxycycline (VIBRAMYCIN) 100 MG capsule  2 times daily        05/19/23 0833              Virgina Norfolk, DO 05/19/23 [phone]

## 2023-05-19 NOTE — Discharge Instructions (Signed)
Take first dose of doxycycline when you get home.  Take next dose of prednisone tomorrow morning.  Continue breathing treatments every 4-6 hours.  Return if symptoms worsen.  Follow-up with primary care doctor in 3 to 4 days for reevaluation.

## 2023-07-24 ENCOUNTER — Telehealth: Payer: Self-pay

## 2023-07-24 NOTE — Telephone Encounter (Signed)
Pharmacy please advise on holding Eliquis prior to colonoscopy/endoscopy scheduled for 08/23/2023. Thank you.

## 2023-07-24 NOTE — Telephone Encounter (Signed)
Name: [name]  DOB: 1951-05-14  MRN: [id number]  Primary Cardiologist: None   Preoperative team, please contact this patient and set up a phone call appointment for further preoperative risk assessment. Please obtain consent and complete medication review. Thank you for your help.  I confirm that guidance regarding antiplatelet and oral anticoagulation therapy has been completed and, if necessary, noted below.  Per office protocol, patient can hold Eliquis for 1-2 days prior to procedure.   I also confirmed the patient resides in the state of [place]. As per [facility] Medical Board telemedicine laws, the patient must reside in the state in which the provider is licensed.   Napoleon Form, Leodis Rains, NP 07/24/2023, 2:05 PM [facility]

## 2023-07-24 NOTE — Telephone Encounter (Signed)
Patient with diagnosis of afib on Eliquis for anticoagulation.    Procedure: colonoscopy/endoscopy Date of procedure: 08/23/23  CHA2DS2-VASc Score = 4  This indicates a 4.8% annual risk of stroke. The patient's score is based upon: CHF History: 0 HTN History: 1 Diabetes History: 1 Stroke History: 0 Vascular Disease History: 0 Age Score: 1 Gender Score: 1   CrCl 26mL/min using adjusted body weight Platelet count 249K  Per office protocol, patient can hold Eliquis for 1-2 days prior to procedure.    **This guidance is not considered finalized until pre-operative APP has relayed final recommendations.**

## 2023-07-24 NOTE — Telephone Encounter (Signed)
...  Pre-operative Risk Assessment    Patient Name: [name]  DOB: 21-Mar-1951 MRN: [id number]      Request for Surgical Clearance    Procedure:   COLONOSCOPY/ENDOSCOPY  Date of Surgery:  Clearance 08/23/23                                 Surgeon:  DR Kerin Salen Surgeon's Group or Practice Name:  [facility] Phone number:  [phone] Fax number:  [phone]   Type of Clearance Requested:   - Medical  - Pharmacy:  Hold Apixaban (Eliquis)     Type of Anesthesia:  Not Indicated   Additional requests/questions:   LAST O/V 08/10/23, NEXT APPT 01/27/23  Signed, Renee Ramus   07/24/2023, 12:18 PM

## 2023-07-24 NOTE — Telephone Encounter (Signed)
Per pre op APP today, ok to defer clearance to appt 07/31/23 with Dr. Odis Hollingshead. I will update all parties involved.

## 2023-07-27 ENCOUNTER — Telehealth: Payer: Self-pay | Admitting: Adult Health

## 2023-07-27 NOTE — Telephone Encounter (Signed)
Okay, I can address it on 07/31/23 appt  Tessa Lerner, DO, [facility]

## 2023-07-27 NOTE — Telephone Encounter (Signed)
Fax received from Dr. Marcos Eke with [facility]  to perform a colonoscopy and endoscopy on patient.  Patient needs surgery clearance. Surgery is 08/23/23. Patient was seen on 08/05/23 . Office protocol is a risk assessment can be sent to surgeon if patient has been seen in 60 days or less.   I called pt to get her scheduled for appt to be cleared for these procedures. I offered appt for tomorrow 07/28/23 and explained to her that the next available was not until Sep 05 2023. She wanted the Dec 10 appt and therefore she will need to call and discuss rescheduling her procedure with GI. Holding in surgery clearance basket until she is seen.

## 2023-07-31 ENCOUNTER — Ambulatory Visit: Payer: 59 | Attending: Cardiology | Admitting: Cardiology

## 2023-07-31 ENCOUNTER — Other Ambulatory Visit: Payer: Self-pay | Admitting: Cardiology

## 2023-07-31 ENCOUNTER — Encounter: Payer: Self-pay | Admitting: Cardiology

## 2023-07-31 VITALS — BP 142/78 | HR 55 | Resp 16 | Ht 65.0 in | Wt 174.6 lb

## 2023-07-31 DIAGNOSIS — I480 Paroxysmal atrial fibrillation: Secondary | ICD-10-CM

## 2023-07-31 DIAGNOSIS — F17200 Nicotine dependence, unspecified, uncomplicated: Secondary | ICD-10-CM

## 2023-07-31 DIAGNOSIS — Z7901 Long term (current) use of anticoagulants: Secondary | ICD-10-CM

## 2023-07-31 DIAGNOSIS — I10 Essential (primary) hypertension: Secondary | ICD-10-CM | POA: Diagnosis not present

## 2023-07-31 DIAGNOSIS — E782 Mixed hyperlipidemia: Secondary | ICD-10-CM

## 2023-07-31 DIAGNOSIS — J449 Chronic obstructive pulmonary disease, unspecified: Secondary | ICD-10-CM

## 2023-07-31 DIAGNOSIS — F17210 Nicotine dependence, cigarettes, uncomplicated: Secondary | ICD-10-CM

## 2023-07-31 DIAGNOSIS — Z9289 Personal history of other medical treatment: Secondary | ICD-10-CM

## 2023-07-31 DIAGNOSIS — IMO0001 Reserved for inherently not codable concepts without codable children: Secondary | ICD-10-CM

## 2023-07-31 DIAGNOSIS — E119 Type 2 diabetes mellitus without complications: Secondary | ICD-10-CM

## 2023-07-31 MED ORDER — NICOTINE 7 MG/24HR TD PT24
7.0000 mg | MEDICATED_PATCH | Freq: Every day | TRANSDERMAL | 0 refills | Status: DC
Start: 2023-07-31 — End: 2024-01-17

## 2023-07-31 MED ORDER — DILTIAZEM HCL ER COATED BEADS 240 MG PO CP24
240.0000 mg | ORAL_CAPSULE | Freq: Every day | ORAL | 3 refills | Status: DC
Start: 2023-07-31 — End: 2024-01-17

## 2023-07-31 MED ORDER — NICOTINE POLACRILEX 2 MG MT LOZG
2.0000 mg | LOZENGE | OROMUCOSAL | 0 refills | Status: AC | PRN
Start: 2023-07-31 — End: ?

## 2023-07-31 MED ORDER — SPIRONOLACTONE 25 MG PO TABS
25.0000 mg | ORAL_TABLET | Freq: Every day | ORAL | 3 refills | Status: DC
Start: 2023-07-31 — End: 2024-07-24

## 2023-07-31 NOTE — Patient Instructions (Addendum)
Medication Instructions:  Your physician has recommended you make the following change in your medication:   STOP Lopressor 25 mg   STOP Potassium 10 mEq  START Spironolactone 25 mg daily   START Nicotine patch and lozenges   INCREASE Diltiazem (Cardizem) to 240 mg once daily    *If you need a refill on your cardiac medications before your next appointment, please call your pharmacy*  Lab Work: BMP and hemoglobin/hematocrit in 1 WEEK  If you have labs (blood work) drawn today and your tests are completely normal, you will receive your results only by: MyChart Message (if you have MyChart) OR A paper copy in the mail If you have any lab test that is abnormal or we need to change your treatment, we will call you to review the results.  Testing/Procedures: None ordered today.  Follow-Up: At [facility], you and your health needs are our priority.  As part of our continuing mission to provide you with exceptional heart care, we have created designated Provider Care Teams.  These Care Teams include your primary Cardiologist (physician) and Advanced Practice Providers (APPs -  Physician Assistants and Nurse Practitioners) who all work together to provide you with the care you need, when you need it.  Please establish care with a primary care provider   Your next appointment:   6 month(s)  The format for your next appointment:   In Person  Provider:   Tessa Lerner, DO {

## 2023-07-31 NOTE — Progress Notes (Signed)
Cardiology Office Note:  .   Date:  07/31/2023  ID:  [name], DOB 16-Jun-1951, MRN [id number] PCP:  Patient, No Pcp Per  Former Cardiology Providers: None [facility] Providers Cardiologist:  Tessa Lerner, [place] , [facility] (established care November 2022) Electrophysiologist:  None  Click to update primary MD,subspecialty MD or APP then REFRESH:1}    Chief Complaint  Patient presents with   Paroxysmal A-fib     History of Present Illness: .   [name] is a 72 y.o. African-American female whose past medical history and cardiovascular risk factors includes: Paroxysmal atrial fibrillation status post TEE guided cardioversion (08/2021), aortic atherosclerosis, COPD, tobacco use, diet-controlled type II diabetes, hypertension, hyperlipidemia, rheumatoid arthritis, GERD .    Patient was hospitalized in November 2022 for COPD exacerbation was diagnosed with new onset of atrial fibrillation with rapid ventricular rate.  She did not convert with parenteral medications and underwent TEE guided cardioversion and since then has remained in sinus rhythm.  She presents today for 20-month follow-up visit.  She denies anginal chest pain or heart failure symptoms.  Patient states that she is in the search for finding a new PCP.  No recent labs available for review.  Smokes 2 to 3 cigarettes/day-strongly motivated to quit completely.  Office blood pressures range between 140-150 mmHg on current medical therapy.   Review of Systems: .   Review of Systems  Cardiovascular:  Negative for chest pain, claudication, irregular heartbeat, leg swelling, near-syncope, orthopnea, palpitations, paroxysmal nocturnal dyspnea and syncope.  Respiratory:  Negative for shortness of breath.   Hematologic/Lymphatic: Negative for bleeding problem.    Studies Reviewed:   EKG: EKG Interpretation Date/Time:  Monday July 31 2023 15:46:44 EST Ventricular Rate:  49 PR Interval:  164 QRS  Duration:  82 QT Interval:  444 QTC Calculation: 401 R Axis:   42  Text Interpretation: Sinus bradycardia with sinus arrhythmia When compared with ECG of 19-May-2023 07:56, No significant change since last tracing Confirmed by Tessa Lerner ([id number]) on 07/31/2023 3:49:26 PM  Echocardiogram: 06/29/2022 [facility] outside records provided by the patient): LVEF 55-60%, grade 2 diastolic dysfunction, elevated LAP, mildly dilated left atrium, mild TR.   Stress Testing: Exercise Myoview stress test 12/28/2021: Exercise nuclear stress test was performed using Bruce protocol. 1 Day Rest/Stress Protocol. Exercise time 4 minutes 10 seconds, 6.04 METS, 88%APMHR.  Stress ECG negative for ischemia.  Normal myocardial perfusion with diaphragmatic attenuation artifact.  No convincing evidence of reversible myocardial ischemia or prior infarct. Left ventricular size is normal, LVEF visually preserved.  Low risk study.  TEE guided cardioversion: 08/26/2021: 200 J x 1 restored to sinus rhythm   RADIOLOGY: N/A  Risk Assessment/Calculations:   Click Here to Calculate/Change CHADS2VASc Score The patient's CHADS2-VASc score is 5, indicating a 7.2% annual risk of stroke.    Labs:       Latest Ref Rng & Units 01/31/2023    1:08 PM 01/02/2023    4:32 PM 06/11/2022   10:35 PM  CBC  WBC 4.0 - 10.5 K/uL  9.2  9.9   Hemoglobin 11.1 - 15.9 g/dL 42.5  95.6  38.7   Hematocrit 34.0 - 46.6 % 36.9  35.2  33.9   Platelets 150 - 400 K/uL  249  219        Latest Ref Rng & Units 01/31/2023    1:08 PM 01/02/2023    4:32 PM 06/11/2022   10:35 PM  BMP  Glucose 70 -  99 mg/dL 76  130  84   BUN 8 - 27 mg/dL 16  12  21    Creatinine 0.57 - 1.00 mg/dL 8.65  7.84  6.96   BUN/Creat Ratio 12 - 28 18     Sodium 134 - 144 mmol/L 142  127  135   Potassium 3.5 - 5.2 mmol/L 4.4  3.1  3.4   Chloride 96 - 106 mmol/L 98  88  97   CO2 20 - 29 mmol/L 26  29  31    Calcium 8.7 - 10.3 mg/dL 29.5  9.7  9.2       Latest Ref  Rng & Units 01/31/2023    1:08 PM 01/02/2023    4:32 PM 06/11/2022   10:35 PM  CMP  Glucose 70 - 99 mg/dL 76  284  84   BUN 8 - 27 mg/dL 16  12  21    Creatinine 0.57 - 1.00 mg/dL 1.32  4.40  1.02   Sodium 134 - 144 mmol/L 142  127  135   Potassium 3.5 - 5.2 mmol/L 4.4  3.1  3.4   Chloride 96 - 106 mmol/L 98  88  97   CO2 20 - 29 mmol/L 26  29  31    Calcium 8.7 - 10.3 mg/dL 72.5  9.7  9.2   Total Protein 6.5 - 8.1 g/dL  7.2  6.5   Total Bilirubin 0.3 - 1.2 mg/dL  0.7  0.6   Alkaline Phos 38 - 126 U/L  62  48   AST 15 - 41 U/L  18  13   ALT 0 - 44 U/L  12  8     No results found for: "CHOL", "HDL", "LDLCALC", "LDLDIRECT", "TRIG", "CHOLHDL" No results for input(s): "LIPOA" in the last 8760 hours. No components found for: "NTPROBNP" No results for input(s): "PROBNP" in the last 8760 hours. No results for input(s): "TSH" in the last 8760 hours.   Physical Exam:    Today's Vitals   07/31/23 1553 07/31/23 1554  BP: (!) 154/76 (!) 142/78  Pulse: (!) 55   Resp: 16   SpO2: 96%   Weight: 174 lb 9.6 oz (79.2 kg)   Height: 5\' 5"  (1.651 m)    Body mass index is 29.05 kg/m. Wt Readings from Last 3 Encounters:  07/31/23 174 lb 9.6 oz (79.2 kg)  05/19/23 170 lb (77.1 kg)  01/27/23 175 lb 12.8 oz (79.7 kg)    Physical Exam  Constitutional: No distress.  hemodynamically stable  Neck: No JVD present.  Cardiovascular: Normal rate, regular rhythm, S1 normal and S2 normal. Exam reveals no gallop, no S3 and no S4.  No murmur heard. Pulmonary/Chest: Effort normal and breath sounds normal. No stridor. She has no wheezes. She has no rales.  Abdominal: Soft. Bowel sounds are normal. She exhibits no distension. There is no abdominal tenderness.  Musculoskeletal:        General: No edema.     Cervical back: Neck supple.  Neurological: She is alert and oriented to person, place, and time. She has intact cranial nerves (2-12).  Skin: Skin is warm.     Impression & Recommendation(s):   Impression:   ICD-10-CM   1. Paroxysmal A-fib (HCC)  I48.0 EKG 12-Lead    diltiazem (CARDIZEM CD) 240 MG 24 hr capsule    2. Long term (current) use of anticoagulants  Z79.01 Hemoglobin and hematocrit, blood    Hemoglobin and hematocrit, blood    3. History of  cardioversion  Z92.89     4. Benign hypertension  I10 Basic metabolic panel    spironolactone (ALDACTONE) 25 MG tablet    diltiazem (CARDIZEM CD) 240 MG 24 hr capsule    Basic metabolic panel    5. Mixed hyperlipidemia  E78.2     6. Non-insulin dependent type 2 diabetes mellitus (HCC)  E11.9     7. Chronic obstructive pulmonary disease, unspecified COPD type (HCC)  J44.9     8. Cigarette smoker  F17.210 nicotine (NICODERM CQ - DOSED IN MG/24 HR) 7 mg/24hr patch    nicotine polacrilex (NICOTINE MINI) 2 MG lozenge       Recommendation(s):  Paroxysmal A-fib (HCC) Long term (current) use of anticoagulants History of cardioversion Rate control: Diltiazem. Rhythm control: N/A. Thromboembolic prophylaxis: Eliquis. Discontinue Lopressor 25 mg p.o. twice daily Increase Cardizem from 180 mg p.o. daily to 240 mg p.o. daily. Check BMP and H&H Patient does not endorse evidence of bleeding Risks, benefits, and alternatives to anticoagulation discussed with the patient.  Benign hypertension Office blood pressures are not at goal. Home blood pressures are also elevated. Medications reconciled. Discontinue potassium 10 mEq p.o. daily. Start spironolactone 25 mg p.o. every morning BMP in one week to check renal function and electrolytes 1 week. Reemphasized importance of keeping a log of her blood pressures at home and to review with PCP once established care  Mixed hyperlipidemia Currently on atorvastatin 40 mg p.o. daily.   She denies myalgia or other side effects. No recent lipids available for review. Currently in the search of a PCP. She will send [place] a copy of her lipids once checked at her annual well  visit.  Non-insulin dependent type 2 diabetes mellitus (HCC) Reemphasized importance of glycemic control. Currently on ARB, statin therapy. Consider SGLT2 inhibitors.  Cigarette smoker Smokes 2 to 3 cigarettes/day. Strongly encouraged and motivated to stop smoking. Tried the nicotine patches in the summer months but did not adhere to the skin.  Patient is willing to retry. Nicotine patches prescribed along with nicotine lozenges to use on as needed basis  I have encouraged her to call her insurance company and see which primary providers are considered to be in network and accepting new patients as she should get reestablished with PCP.  Orders Placed:  Orders Placed This Encounter  Procedures   Basic metabolic panel    Standing Status:   Future    Number of Occurrences:   1    Standing Expiration Date:   07/30/2024   Hemoglobin and hematocrit, blood    Standing Status:   Future    Number of Occurrences:   1    Standing Expiration Date:   07/30/2024   EKG 12-Lead    Final Medication List:    Meds ordered this encounter  Medications   nicotine (NICODERM CQ - DOSED IN MG/24 HR) 7 mg/24hr patch    Sig: Place 1 patch (7 mg total) onto the skin daily.    Dispense:  28 patch    Refill:  0   nicotine polacrilex (NICOTINE MINI) 2 MG lozenge    Sig: Take 1 lozenge (2 mg total) by mouth as needed for smoking cessation.    Dispense:  100 tablet    Refill:  0   spironolactone (ALDACTONE) 25 MG tablet    Sig: Take 1 tablet (25 mg total) by mouth daily.    Dispense:  90 tablet    Refill:  3   diltiazem (CARDIZEM CD) 240  MG 24 hr capsule    Sig: Take 1 capsule (240 mg total) by mouth daily.    Dispense:  90 capsule    Refill:  3    Increasing dose to 240 mg    Medications Discontinued During This Encounter  Medication Reason   diltiazem (CARDIZEM CD) 180 MG 24 hr capsule Dose change   metoprolol tartrate (LOPRESSOR) 25 MG tablet Discontinued by provider   potassium chloride  (KLOR-CON) 10 MEQ tablet Discontinued by provider     Current Outpatient Medications:    albuterol (PROVENTIL) (2.5 MG/3ML) 0.083% nebulizer solution, Take 3 mLs (2.5 mg total) by nebulization every 6 (six) hours as needed for wheezing or shortness of breath., Disp: 75 mL, Rfl: 12   albuterol (VENTOLIN HFA) 108 (90 Base) MCG/ACT inhaler, Inhale 1 puff into the lungs every 6 (six) hours as needed for shortness of breath or wheezing., Disp: , Rfl:    apixaban (ELIQUIS) 5 MG TABS tablet, Take 1 tablet (5 mg total) by mouth 2 (two) times daily., Disp: 60 tablet, Rfl: 2   atorvastatin (LIPITOR) 40 MG tablet, Take 40 mg by mouth daily., Disp: , Rfl:    blood glucose meter kit and supplies KIT, Dispense based on patient and insurance preference. Use up to four times daily as directed., Disp: 1 each, Rfl: 0   chlorthalidone (HYGROTON) 25 MG tablet, Take 1 tablet (25 mg total) by mouth every morning., Disp: 90 tablet, Rfl: 3   diltiazem (CARDIZEM CD) 240 MG 24 hr capsule, Take 1 capsule (240 mg total) by mouth daily., Disp: 90 capsule, Rfl: 3   EPINEPHrine 0.3 mg/0.3 mL IJ SOAJ injection, Inject 0.3 mg into the muscle as needed for anaphylaxis., Disp: 1 each, Rfl: 1   Fluticasone-Umeclidin-Vilant (TRELEGY ELLIPTA) 100-62.5-25 MCG/ACT AEPB, Inhale 1 puff into the lungs daily., Disp: 60 each, Rfl: 5   ibuprofen (ADVIL) 200 MG tablet, Take 200 mg by mouth every 6 (six) hours as needed., Disp: , Rfl:    losartan (COZAAR) 100 MG tablet, Take 100 mg by mouth every evening., Disp: , Rfl:    MAGNESIUM-OXIDE 400 (240 Mg) MG tablet, Take 1 tablet by mouth daily., Disp: , Rfl:    metFORMIN (GLUCOPHAGE) 500 MG tablet, Take 1 tablet (500 mg total) by mouth 2 (two) times daily with a meal., Disp: 30 tablet, Rfl: 11   nicotine (NICODERM CQ - DOSED IN MG/24 HR) 7 mg/24hr patch, Place 1 patch (7 mg total) onto the skin daily., Disp: 28 patch, Rfl: 0   nicotine polacrilex (NICOTINE MINI) 2 MG lozenge, Take 1 lozenge (2 mg  total) by mouth as needed for smoking cessation., Disp: 100 tablet, Rfl: 0   pantoprazole (PROTONIX) 40 MG tablet, Take 40 mg by mouth 2 (two) times daily., Disp: , Rfl:    spironolactone (ALDACTONE) 25 MG tablet, Take 1 tablet (25 mg total) by mouth daily., Disp: 90 tablet, Rfl: 3  Current Facility-Administered Medications:    ipratropium-albuterol (DUONEB) 0.5-2.5 (3) MG/3ML nebulizer solution 3 mL, 3 mL, Nebulization, Q6H PRN, Lupita Leash, MD  Consent:   N/A  Disposition:   6 months sooner if needed Patient may be asked to follow-up sooner based on the results of the above-mentioned testing.  Her questions and concerns were addressed to her satisfaction. She voices understanding of the recommendations provided during this encounter.    Signed, Tessa Lerner, DO, [facility]  [facility]  [address] [place], [place] [address] 07/31/2023 4:43 PM

## 2023-08-01 MED ORDER — LOSARTAN POTASSIUM 100 MG PO TABS: 100.0000 mg | ORAL_TABLET | Freq: Every evening | ORAL | 3 refills | Status: AC

## 2023-08-05 LAB — BASIC METABOLIC PANEL
BUN/Creatinine Ratio: 13 (ref 12–28)
BUN: 15 mg/dL (ref 8–27)
CO2: 27 mmol/L (ref 20–29)
Calcium: 10.4 mg/dL — ABNORMAL HIGH (ref 8.7–10.3)
Chloride: 91 mmol/L — ABNORMAL LOW (ref 96–106)
Creatinine, Ser: 1.16 mg/dL — ABNORMAL HIGH (ref 0.57–1.00)
Glucose: 84 mg/dL (ref 70–99)
Potassium: 4 mmol/L (ref 3.5–5.2)
Sodium: 134 mmol/L (ref 134–144)
eGFR: 51 mL/min/{1.73_m2} — ABNORMAL LOW (ref >59)

## 2023-08-05 LAB — HEMOGLOBIN AND HEMATOCRIT, BLOOD
Hematocrit: 35.6 % (ref 34.0–46.6)
Hemoglobin: 11 g/dL — ABNORMAL LOW (ref 11.1–15.9)

## 2023-08-15 ENCOUNTER — Telehealth: Payer: Self-pay | Admitting: Cardiology

## 2023-08-15 NOTE — Telephone Encounter (Signed)
Will forward to preop APP to review if the pt has been cleared. Pt saw Dr. Odis Hollingshead 07/31/23, though I do not see if pt was cleared.

## 2023-08-15 NOTE — Telephone Encounter (Signed)
Acceptable risk for upcoming endoscopy. Hold Eliquis for 2 days prior to endoscopy and restart oral anticoagulation once cleared by GI based on his findings and hemostasis.  Tobey Schmelzle Vici, DO, [facility]

## 2023-08-15 NOTE — Telephone Encounter (Signed)
[facility] said they have not received clearance or how to hold medication. Please advise

## 2023-08-15 NOTE — Telephone Encounter (Signed)
Good Morning Dr. Odis Hollingshead  We have received a surgical clearance request for Ms. Leggette for upcoming endoscopy/colonoscopy procedure. They were seen recently in clinic on 11//24. Can you please comment on surgical clearance and guidance on holding Eliquis. Please forward you guidance and recommendations to P [facility] DIV PREOP   Thank you, Robin Searing, NP

## 2023-08-16 NOTE — Telephone Encounter (Signed)
   Patient Name: [name]  DOB: 17-Nov-1950 MRN: [id number]  Primary Cardiologist: Tessa Lerner, DO  Chart reviewed as part of pre-operative protocol coverage. Given past medical history and time since last visit, based on ACC/AHA guidelines, [name] is at acceptable risk for the planned procedure without further cardiovascular testing.   Per Dr. Odis Hollingshead, "Acceptable risk for upcoming endoscopy/colonoscopy. Hold Eliquis for 2 days prior to endoscopy and restart oral anticoagulation once cleared by GI based on his findings and hemostasis."   I will route this recommendation to the requesting party via Epic fax function and remove from pre-op pool.  Please call with questions.  Rip Harbour, NP 08/16/2023, 8:42 AM

## 2023-09-05 ENCOUNTER — Ambulatory Visit: Payer: 59 | Admitting: Emergency Medicine

## 2023-09-15 NOTE — Telephone Encounter (Signed)
Appt is now with [place] for 11/17/23

## 2023-10-30 ENCOUNTER — Emergency Department (HOSPITAL_BASED_OUTPATIENT_CLINIC_OR_DEPARTMENT_OTHER)
Admission: EM | Admit: 2023-10-30 | Discharge: 2023-10-30 | Disposition: A | Discharge status: Home / Self Care | Payer: 59 | Attending: Emergency Medicine | Admitting: Emergency Medicine

## 2023-10-30 ENCOUNTER — Other Ambulatory Visit: Payer: Self-pay

## 2023-10-30 ENCOUNTER — Emergency Department (HOSPITAL_BASED_OUTPATIENT_CLINIC_OR_DEPARTMENT_OTHER): Payer: 59

## 2023-10-30 ENCOUNTER — Encounter (HOSPITAL_BASED_OUTPATIENT_CLINIC_OR_DEPARTMENT_OTHER): Payer: Self-pay

## 2023-10-30 DIAGNOSIS — Z79899 Other long term (current) drug therapy: Secondary | ICD-10-CM | POA: Insufficient documentation

## 2023-10-30 DIAGNOSIS — Z7901 Long term (current) use of anticoagulants: Secondary | ICD-10-CM | POA: Diagnosis not present

## 2023-10-30 DIAGNOSIS — J449 Chronic obstructive pulmonary disease, unspecified: Secondary | ICD-10-CM | POA: Diagnosis not present

## 2023-10-30 DIAGNOSIS — Z7984 Long term (current) use of oral hypoglycemic drugs: Secondary | ICD-10-CM | POA: Diagnosis not present

## 2023-10-30 DIAGNOSIS — I4891 Unspecified atrial fibrillation: Secondary | ICD-10-CM | POA: Diagnosis not present

## 2023-10-30 DIAGNOSIS — J101 Influenza due to other identified influenza virus with other respiratory manifestations: Secondary | ICD-10-CM | POA: Insufficient documentation

## 2023-10-30 DIAGNOSIS — R0602 Shortness of breath: Secondary | ICD-10-CM | POA: Diagnosis present

## 2023-10-30 DIAGNOSIS — Z20822 Contact with and (suspected) exposure to covid-19: Secondary | ICD-10-CM | POA: Insufficient documentation

## 2023-10-30 DIAGNOSIS — Z7951 Long term (current) use of inhaled steroids: Secondary | ICD-10-CM | POA: Insufficient documentation

## 2023-10-30 DIAGNOSIS — E119 Type 2 diabetes mellitus without complications: Secondary | ICD-10-CM | POA: Diagnosis not present

## 2023-10-30 LAB — RESP PANEL BY RT-PCR (RSV, FLU A&B, COVID)  RVPGX2
Influenza A by PCR: POSITIVE — AB
Influenza B by PCR: NEGATIVE
Resp Syncytial Virus by PCR: NEGATIVE
SARS Coronavirus 2 by RT PCR: NEGATIVE

## 2023-10-30 MED ORDER — BENZONATATE 100 MG PO CAPS: 100.0000 mg | ORAL_CAPSULE | Freq: Three times a day (TID) | ORAL | 0 refills | Status: DC

## 2023-10-30 MED ORDER — OSELTAMIVIR PHOSPHATE 75 MG PO CAPS: 75.0000 mg | ORAL_CAPSULE | Freq: Two times a day (BID) | ORAL | 0 refills | Status: DC

## 2023-10-30 MED ORDER — ALBUTEROL SULFATE (2.5 MG/3ML) 0.083% IN NEBU
2.5000 mg | INHALATION_SOLUTION | Freq: Once | RESPIRATORY_TRACT | Status: AC
  Administered 2023-10-30: 2.5 mg via RESPIRATORY_TRACT
  Filled 2023-10-30: qty 3

## 2023-10-30 MED ORDER — IPRATROPIUM-ALBUTEROL 0.5-2.5 (3) MG/3ML IN SOLN
3.0000 mL | Freq: Once | RESPIRATORY_TRACT | Status: AC
  Administered 2023-10-30: 3 mL via RESPIRATORY_TRACT
  Filled 2023-10-30: qty 3

## 2023-10-30 NOTE — ED Provider Notes (Signed)
[facility] Provider Note   CSN: [id number] Arrival date & time: 10/30/23  0416     History  Chief Complaint  Patient presents with   Shortness of Breath    [name] is a 73 y.o. female.  Patient is a 73 year old female with past medical history of COPD, diabetes, hypertension, hyperlipidemia, atrial fibrillation.  Patient presenting today with complaints of shortness of breath.  She also reports productive cough that started 3 days ago.  She has had low-grade fever at home and presents here febrile with a temp of 101.  No aggravating or alleviating factors.  The history is provided by the patient.       Home Medications Prior to Admission medications   Medication Sig Start Date End Date Taking? Authorizing Provider  albuterol (PROVENTIL) (2.5 MG/3ML) 0.083% nebulizer solution Take 3 mLs (2.5 mg total) by nebulization every 6 (six) hours as needed for wheezing or shortness of breath. 08/17/16   Mesner, Barbara Cower, MD  albuterol (VENTOLIN HFA) 108 (90 Base) MCG/ACT inhaler Inhale 1 puff into the lungs every 6 (six) hours as needed for shortness of breath or wheezing. 06/22/21   [provider]  apixaban (ELIQUIS) 5 MG TABS tablet Take 1 tablet (5 mg total) by mouth 2 (two) times daily. 08/27/21   Meredeth Ide, MD  atorvastatin (LIPITOR) 40 MG tablet Take 40 mg by mouth daily. 05/17/17   [provider]  blood glucose meter kit and supplies KIT Dispense based on patient and insurance preference. Use up to four times daily as directed. 08/27/21   Meredeth Ide, MD  chlorthalidone (HYGROTON) 25 MG tablet Take 1 tablet (25 mg total) by mouth every morning. 01/27/23 07/31/23  Tolia, Sunit, DO  diltiazem (CARDIZEM CD) 240 MG 24 hr capsule Take 1 capsule (240 mg total) by mouth daily. 07/31/23   Tolia, Sunit, DO  EPINEPHrine 0.3 mg/0.3 mL IJ SOAJ injection Inject 0.3 mg into the muscle as needed for anaphylaxis. 03/21/21   Pennie Banter, DO  Fluticasone-Umeclidin-Vilant (TRELEGY ELLIPTA) 100-62.5-25 MCG/ACT AEPB Inhale 1 puff into the lungs daily. 07/21/22   Lupita Leash, MD  ibuprofen (ADVIL) 200 MG tablet Take 200 mg by mouth every 6 (six) hours as needed.    [provider]  losartan (COZAAR) 100 MG tablet Take 1 tablet (100 mg total) by mouth every evening. 08/01/23   Tolia, Sunit, DO  MAGNESIUM-OXIDE 400 (240 Mg) MG tablet Take 1 tablet by mouth daily. 07/25/23   [provider]  metFORMIN (GLUCOPHAGE) 500 MG tablet Take 1 tablet (500 mg total) by mouth 2 (two) times daily with a meal. 08/27/21 07/31/23  Meredeth Ide, MD  nicotine (NICODERM CQ - DOSED IN MG/24 HR) 7 mg/24hr patch Place 1 patch (7 mg total) onto the skin daily. 07/31/23   Tolia, Sunit, DO  nicotine polacrilex (NICOTINE MINI) 2 MG lozenge Take 1 lozenge (2 mg total) by mouth as needed for smoking cessation. 07/31/23   Tolia, Sunit, DO  pantoprazole (PROTONIX) 40 MG tablet Take 40 mg by mouth 2 (two) times daily.    [provider]  spironolactone (ALDACTONE) 25 MG tablet Take 1 tablet (25 mg total) by mouth daily. 07/31/23   Tolia, Sunit, DO  ranitidine (ZANTAC) 150 MG tablet Take 1 tablet (150 mg total) by mouth 2 (two) times daily. Patient not taking: No sig reported 06/25/18 10/04/20  Parrett, Virgel Bouquet, NP  Allergies    Azithromycin, Benazepril, Cephalexin, Spiriva [tiotropium bromide monohydrate], Benadryl [diphenhydramine hcl], Other, and Nexium [esomeprazole magnesium]    Review of Systems   Review of Systems  All other systems reviewed and are negative.   Physical Exam Updated Vital Signs BP 103/62   Pulse 80   Temp (!) 101.1 F (38.4 C) (Oral)   Resp 19   Ht 5\' 5"  (1.651 m)   Wt 74.8 kg   SpO2 96%   BMI 27.46 kg/m  Physical Exam Vitals and nursing note reviewed.  Constitutional:      General: She is not in acute distress.    Appearance: She is well-developed. She is not diaphoretic.  HENT:      Head: Normocephalic and atraumatic.  Cardiovascular:     Rate and Rhythm: Normal rate and regular rhythm.     Heart sounds: No murmur heard.    No friction rub. No gallop.  Pulmonary:     Effort: Pulmonary effort is normal. No respiratory distress.     Breath sounds: Examination of the right-middle field reveals rhonchi. Examination of the left-middle field reveals rhonchi. Rhonchi present. No wheezing.  Abdominal:     General: Bowel sounds are normal. There is no distension.     Palpations: Abdomen is soft.     Tenderness: There is no abdominal tenderness.  Musculoskeletal:        General: Normal range of motion.     Cervical back: Normal range of motion and neck supple.     Right lower leg: No edema.     Left lower leg: No edema.  Skin:    General: Skin is warm and dry.  Neurological:     General: No focal deficit present.     Mental Status: She is alert and oriented to person, place, and time.     ED Results / Procedures / Treatments   Labs (all labs ordered are listed, but only abnormal results are displayed) Labs Reviewed  RESP PANEL BY RT-PCR (RSV, FLU A&B, COVID)  [id number]    EKG None  Radiology No results found.  Procedures Procedures    Medications Ordered in ED Medications  ipratropium-albuterol (DUONEB) 0.5-2.5 (3) MG/3ML nebulizer solution 3 mL (3 mLs Nebulization Given 10/30/23 0508)  albuterol (PROVENTIL) (2.5 MG/3ML) 0.083% nebulizer solution 2.5 mg (2.5 mg Nebulization Given 10/30/23 0508)    ED Course/ Medical Decision Making/ A&P  Patient is a 73 year old female presenting with complaints of cough and congestion as described in the HPI.  She arrives here febrile, but with stable vital signs.  There is no hypoxia.  Physical examination reveals moderate expiratory wheezes bilaterally.  Respiratory panel test positive for influenza A.  Chest x-ray showing possible bronchitic changes, but no infiltrate.  Patient has received a breathing treatment here  in the ER and is feeling better.  She will be discharged with Tamiflu and Tessalon.  She has to follow-up as needed if not improving.  Final Clinical Impression(s) / ED Diagnoses Final diagnoses:  None    Rx / DC Orders ED Discharge Orders     None         Geoffery Lyons, MD 10/30/23 [phone]

## 2023-10-30 NOTE — ED Triage Notes (Signed)
Patient arrives POV shortness of breath and productive cough that started 3 days ago; adventitious lung sounds audible. Patient has hx of COPD. Patient is febrile during triage with temp of 101.1 F. Boneta Lucks, RRT assessing patient at bedside.

## 2023-10-30 NOTE — Discharge Instructions (Signed)
Begin taking Tamiflu as prescribed.  Begin taking Tessalon as prescribed as needed for cough.  Drink plenty of fluids and get plenty of rest.  Follow-up with primary doctor if not improving in the next week, and return to the ER if symptoms significantly worsen or change.

## 2023-11-08 ENCOUNTER — Ambulatory Visit: Payer: 59 | Admitting: Emergency Medicine

## 2023-11-08 ENCOUNTER — Encounter: Payer: Self-pay | Admitting: Emergency Medicine

## 2023-11-17 ENCOUNTER — Other Ambulatory Visit: Payer: Self-pay | Admitting: *Deleted

## 2023-11-17 DIAGNOSIS — F17210 Nicotine dependence, cigarettes, uncomplicated: Secondary | ICD-10-CM

## 2023-11-17 DIAGNOSIS — Z122 Encounter for screening for malignant neoplasm of respiratory organs: Secondary | ICD-10-CM

## 2023-11-17 DIAGNOSIS — Z87891 Personal history of nicotine dependence: Secondary | ICD-10-CM

## 2023-11-30 ENCOUNTER — Ambulatory Visit: Payer: 59 | Admitting: Adult Health

## 2023-12-01 ENCOUNTER — Other Ambulatory Visit: Payer: Self-pay | Admitting: Cardiology

## 2023-12-01 DIAGNOSIS — I10 Essential (primary) hypertension: Secondary | ICD-10-CM

## 2023-12-04 ENCOUNTER — Ambulatory Visit: Payer: 59 | Admitting: Adult Health

## 2023-12-04 ENCOUNTER — Encounter: Payer: Self-pay | Admitting: Adult Health

## 2023-12-04 VITALS — BP 108/68 | HR 50 | Ht 64.0 in | Wt 181.4 lb

## 2023-12-04 DIAGNOSIS — Z720 Tobacco use: Secondary | ICD-10-CM

## 2023-12-04 DIAGNOSIS — J441 Chronic obstructive pulmonary disease with (acute) exacerbation: Secondary | ICD-10-CM

## 2023-12-04 MED ORDER — DOXYCYCLINE HYCLATE 100 MG PO TABS: 100.0000 mg | ORAL_TABLET | Freq: Two times a day (BID) | ORAL | 0 refills | Status: DC

## 2023-12-04 MED ORDER — BENZONATATE 200 MG PO CAPS
200.0000 mg | ORAL_CAPSULE | Freq: Three times a day (TID) | ORAL | 1 refills | Status: AC | PRN
Start: 2023-12-04 — End: 2024-12-03

## 2023-12-04 MED ORDER — ALBUTEROL SULFATE HFA 108 (90 BASE) MCG/ACT IN AERS: 1.0000 | INHALATION_SPRAY | Freq: Four times a day (QID) | RESPIRATORY_TRACT | 0 refills | Status: DC | PRN

## 2023-12-04 MED ORDER — TRELEGY ELLIPTA 100-62.5-25 MCG/ACT IN AEPB: 1.0000 | INHALATION_SPRAY | Freq: Every day | RESPIRATORY_TRACT | 3 refills | Status: AC

## 2023-12-04 MED ORDER — PREDNISONE 10 MG PO TABS: ORAL_TABLET | ORAL | 0 refills | Status: DC

## 2023-12-04 NOTE — Progress Notes (Signed)
 @Patient  ID: [name], female    DOB: [birth year], 73 y.o.   MRN: [id number]  Chief Complaint  Patient presents with   Follow-up    Referring provider: No ref. provider found  HPI: 73 year old female active smoker followed for COPD  TEST/EVENTS :  Cleda Daub 09/06/2012  FeV1 77%  FeV1/FVC 77%  Fef 25 75 49% CXR 07/2016, 11/2017  neg  Prevnar 2016  LDCT 11/2022 a lung RADS 1S with no suspicious pulmonary nodules.  Mild diffuse bronchial wall thickening and mild emphysema.  12/04/2023 Acute OV: COPD  Patient complains of 4 weeks of cough, congestion and wheezing.  Initially started when she had the flu early last month.  She was treated at urgent care with Tamiflu for 5 days.  Patient says she continues to have ongoing cough with thick yellow mucus.  Chest x-ray on October 30, 2023 showed bronchitic changes.  She denies any hemoptysis, chest pain, orthopnea.  Appetite is been improved recently.  She remains on Trelegy inhaler daily.  Has had increased her albuterol use over the last few weeks.  She says she initially had some fever but that has resolved.  Patient does participate in the lung cancer CT screening program.  Has an upcoming CT chest next week.  She has been using Mucinex without much help.  She is also use of Tessalon Perles which seems to help with her cough. Patient does continue smoke.  We discussed smoking cessation.    Allergies  Allergen Reactions   Azithromycin Anaphylaxis and Swelling    Eyes swell   Benazepril Anaphylaxis    Other reaction(s): SWELLING   Cephalexin Anaphylaxis and Swelling   Spiriva [Tiotropium Bromide Monohydrate] Swelling    Patient reports tongue swelling.   Benadryl [Diphenhydramine Hcl] Nausea And Vomiting   Other Other (See Comments)    Reports allergy to insects. Unsure of reaction.   Nexium [Esomeprazole Magnesium] Palpitations    Immunization History  Administered Date(s) Administered   Fluad Quad(high Dose 65+) 06/28/2022    Influenza Split 06/26/2012, 08/19/2013, 07/05/2014, 07/26/2016   Influenza, High Dose Seasonal PF 07/26/2018, 07/29/2019   Influenza,inj,Quad PF,6+ Mos 07/16/2015, 07/26/2016, 07/10/2017   Influenza-Unspecified 06/26/2012, 08/19/2013, 07/05/2014, 07/26/2016   PFIZER(Purple Top)SARS-COV-2 Vaccination 01/04/2020, 01/28/2020, 09/24/2020   Pneumococcal Conjugate-13 01/22/2015    Past Medical History:  Diagnosis Date   Arthritis    Asthma    COPD (chronic obstructive pulmonary disease) (HCC)    Diabetes mellitus    Heart murmur    High cholesterol    Hyperlipidemia    Hypertension    Kidney infection    Kidney stone    Reflux     Tobacco History: Social History   Tobacco Use  Smoking Status Some Days   Current packs/day: 0.50   Average packs/day: 0.5 packs/day for 57.2 years (28.6 ttl pk-yrs)   Types: Cigarettes   Start date: 09/26/1966  Smokeless Tobacco Current   Types: Chew  Tobacco Comments   Started smoking at age 20.  Currently smoking 2-3 cig per day   Ready to quit: No Counseling given: Yes Tobacco comments: Started smoking at age 39.  Currently smoking 2-3 cig per day   Outpatient Medications Prior to Visit  Medication Sig Dispense Refill   albuterol (PROVENTIL) (2.5 MG/3ML) 0.083% nebulizer solution Take 3 mLs (2.5 mg total) by nebulization every 6 (six) hours as needed for wheezing or shortness of breath. 75 mL 12   apixaban (ELIQUIS) 5 MG TABS tablet Take 1 tablet (5  mg total) by mouth 2 (two) times daily. 60 tablet 2   atorvastatin (LIPITOR) 40 MG tablet Take 40 mg by mouth daily.     blood glucose meter kit and supplies KIT Dispense based on patient and insurance preference. Use up to four times daily as directed. 1 each 0   chlorthalidone (HYGROTON) 25 MG tablet TAKE 1 TABLET BY MOUTH ONCE DAILY IN THE MORNING 90 tablet 2   diltiazem (CARDIZEM CD) 240 MG 24 hr capsule Take 1 capsule (240 mg total) by mouth daily. 90 capsule 3   EPINEPHrine 0.3 mg/0.3 mL IJ  SOAJ injection Inject 0.3 mg into the muscle as needed for anaphylaxis. 1 each 1   losartan (COZAAR) 100 MG tablet Take 1 tablet (100 mg total) by mouth every evening. 90 tablet 3   lubiprostone (AMITIZA) 24 MCG capsule Take 24 mcg by mouth 2 (two) times daily with a meal.     MAGNESIUM-OXIDE 400 (240 Mg) MG tablet Take 1 tablet by mouth daily.     nicotine (NICODERM CQ - DOSED IN MG/24 HR) 7 mg/24hr patch Place 1 patch (7 mg total) onto the skin daily. 28 patch 0   nicotine polacrilex (NICOTINE MINI) 2 MG lozenge Take 1 lozenge (2 mg total) by mouth as needed for smoking cessation. 100 tablet 0   oseltamivir (TAMIFLU) 75 MG capsule Take 1 capsule (75 mg total) by mouth every 12 (twelve) hours. 10 capsule 0   pantoprazole (PROTONIX) 40 MG tablet Take 40 mg by mouth 2 (two) times daily.     spironolactone (ALDACTONE) 25 MG tablet Take 1 tablet (25 mg total) by mouth daily. 90 tablet 3   albuterol (VENTOLIN HFA) 108 (90 Base) MCG/ACT inhaler Inhale 1 puff into the lungs every 6 (six) hours as needed for shortness of breath or wheezing.     benzonatate (TESSALON) 100 MG capsule Take 1 capsule (100 mg total) by mouth every 8 (eight) hours. 21 capsule 0   Fluticasone-Umeclidin-Vilant (TRELEGY ELLIPTA) 100-62.5-25 MCG/ACT AEPB Inhale 1 puff into the lungs daily. 60 each 5   ibuprofen (ADVIL) 200 MG tablet Take 200 mg by mouth every 6 (six) hours as needed. (Patient not taking: Reported on 12/04/2023)     metFORMIN (GLUCOPHAGE) 500 MG tablet Take 1 tablet (500 mg total) by mouth 2 (two) times daily with a meal. 30 tablet 11   Facility-Administered Medications Prior to Visit  Medication Dose Route Frequency Provider Last Rate Last Admin   ipratropium-albuterol (DUONEB) 0.5-2.5 (3) MG/3ML nebulizer solution 3 mL  3 mL Nebulization Q6H PRN Lupita Leash, MD         Review of Systems:   Constitutional:   No  weight loss, night sweats,  Fevers, chills, fatigue, or  lassitude.  HEENT:   No headaches,   Difficulty swallowing,  Tooth/dental problems, or  Sore throat,                No sneezing, itching, ear ache, +nasal congestion, post nasal drip,   CV:  No chest pain,  Orthopnea, PND, swelling in lower extremities, anasarca, dizziness, palpitations, syncope.   GI  No heartburn, indigestion, abdominal pain, nausea, vomiting, diarrhea, change in bowel habits, loss of appetite, bloody stools.   Resp: .  No chest wall deformity  Skin: no rash or lesions.  GU: no dysuria, change in color of urine, no urgency or frequency.  No flank pain, no hematuria   MS:  No joint pain or swelling.  No decreased  range of motion.  No back pain.    Physical Exam  BP 108/68 (BP Location: Left Arm, Patient Position: Sitting, Cuff Size: Large)   Pulse (!) 50   Ht 5\' 4"  (1.626 m)   Wt 181 lb 6.4 oz (82.3 kg)   SpO2 99%   BMI 31.14 kg/m   GEN: A/Ox3; pleasant , NAD, well nourished    HEENT:  [place]/AT, NOSE-clear, THROAT-clear, no lesions, no postnasal drip or exudate noted.   NECK:  Supple w/ fair ROM; no JVD; normal carotid impulses w/o bruits; no thyromegaly or nodules palpated; no lymphadenopathy.    RESP  Clear  P & A; w/o, wheezes/ rales/ or rhonchi. no accessory muscle use, no dullness to percussion  CARD:  RRR, no m/r/g, no peripheral edema, pulses intact, no cyanosis or clubbing.  GI:   Soft & nt; nml bowel sounds; no organomegaly or masses detected.   Musco: Warm bil, no deformities or joint swelling noted.   Neuro: alert, no focal deficits noted.    Skin: Warm, no lesions or rashes    Lab Results:    BMET     ProBNP No results found for: "PROBNP"  Imaging: No results found.  Administration History     None           No data to display          No results found for: "NITRICOXIDE"      Assessment & Plan:   COPD exacerbation (HCC) Slow to resolve COPD exacerbation. -Will treat with empiric antibiotics and steroids. Recommend follow-up for CT chest as  planned next week. Smoking cessation discussed in detail  Plan Patient Instructions  Doxycycline 100mg  Twice daily  For 7 days , take with food.  Prednisone taper over next week.   Mucinex DM Twice daily  As needed  Cough/congestion  Tessalon Three times a day  As needed  Cough  Work on not smoking .  Continue on Trelegy 1 puff daily, rinse after use.  Albuterol inhaler 1-2 puffs every 4hr as needed for wheezing .  CT chest as planned next week.  Follow up with Dr. Judeth Horn or Trinidad Ingle NP in 6-8 weeks -30 min slot  Please contact office for sooner follow up if symptoms do not improve or worsen or seek emergency care     Tobacco abuse Smoking cessation discussed     Rubye Oaks, NP 12/04/2023

## 2023-12-04 NOTE — Assessment & Plan Note (Signed)
 Smoking cessation discussed

## 2023-12-04 NOTE — Patient Instructions (Addendum)
 Doxycycline 100mg  Twice daily  For 7 days , take with food.  Prednisone taper over next week.   Mucinex DM Twice daily  As needed  Cough/congestion  Tessalon Three times a day  As needed  Cough  Work on not smoking .  Continue on Trelegy 1 puff daily, rinse after use.  Albuterol inhaler 1-2 puffs every 4hr as needed for wheezing .  CT chest as planned next week.  Follow up with Dr. Judeth Horn or Shuntay Everetts NP in 6-8 weeks -30 min slot  Please contact office for sooner follow up if symptoms do not improve or worsen or seek emergency care

## 2023-12-04 NOTE — Assessment & Plan Note (Signed)
 Slow to resolve COPD exacerbation. -Will treat with empiric antibiotics and steroids. Recommend follow-up for CT chest as planned next week. Smoking cessation discussed in detail  Plan Patient Instructions  Doxycycline 100mg  Twice daily  For 7 days , take with food.  Prednisone taper over next week.   Mucinex DM Twice daily  As needed  Cough/congestion  Tessalon Three times a day  As needed  Cough  Work on not smoking .  Continue on Trelegy 1 puff daily, rinse after use.  Albuterol inhaler 1-2 puffs every 4hr as needed for wheezing .  CT chest as planned next week.  Follow up with [name] or Courtney Kretschmer NP in 6-8 weeks -30 min slot  Please contact office for sooner follow up if symptoms do not improve or worsen or seek emergency care

## 2023-12-05 MED ORDER — ALBUTEROL SULFATE HFA 108 (90 BASE) MCG/ACT IN AERS: 1.0000 | INHALATION_SPRAY | Freq: Four times a day (QID) | RESPIRATORY_TRACT | 0 refills | Status: DC | PRN

## 2023-12-05 NOTE — Addendum Note (Signed)
 Addended by: Delrae Rend on: 12/05/2023 04:54 PM   Modules accepted: Orders

## 2023-12-11 ENCOUNTER — Ambulatory Visit (HOSPITAL_BASED_OUTPATIENT_CLINIC_OR_DEPARTMENT_OTHER)
Admission: RE | Admit: 2023-12-11 | Discharge: 2023-12-11 | Disposition: A | Discharge status: Home / Self Care | Payer: 59 | Source: Ambulatory Visit | Attending: Acute Care | Admitting: Acute Care

## 2023-12-11 DIAGNOSIS — Z87891 Personal history of nicotine dependence: Secondary | ICD-10-CM | POA: Diagnosis present

## 2023-12-11 DIAGNOSIS — F17210 Nicotine dependence, cigarettes, uncomplicated: Secondary | ICD-10-CM | POA: Diagnosis present

## 2023-12-11 DIAGNOSIS — Z122 Encounter for screening for malignant neoplasm of respiratory organs: Secondary | ICD-10-CM | POA: Diagnosis present

## 2024-01-05 ENCOUNTER — Other Ambulatory Visit: Payer: Self-pay | Admitting: Acute Care

## 2024-01-05 DIAGNOSIS — Z122 Encounter for screening for malignant neoplasm of respiratory organs: Secondary | ICD-10-CM

## 2024-01-05 DIAGNOSIS — Z87891 Personal history of nicotine dependence: Secondary | ICD-10-CM

## 2024-01-05 DIAGNOSIS — F17210 Nicotine dependence, cigarettes, uncomplicated: Secondary | ICD-10-CM

## 2024-01-12 ENCOUNTER — Ambulatory Visit (HOSPITAL_BASED_OUTPATIENT_CLINIC_OR_DEPARTMENT_OTHER)
Admission: RE | Admit: 2024-01-12 | Discharge: 2024-01-12 | Disposition: A | Discharge status: Home / Self Care | Source: Ambulatory Visit | Attending: Acute Care | Admitting: Acute Care

## 2024-01-12 DIAGNOSIS — F17210 Nicotine dependence, cigarettes, uncomplicated: Secondary | ICD-10-CM | POA: Diagnosis present

## 2024-01-12 DIAGNOSIS — Z122 Encounter for screening for malignant neoplasm of respiratory organs: Secondary | ICD-10-CM | POA: Diagnosis present

## 2024-01-12 DIAGNOSIS — Z87891 Personal history of nicotine dependence: Secondary | ICD-10-CM | POA: Diagnosis present

## 2024-01-14 ENCOUNTER — Other Ambulatory Visit: Payer: Self-pay | Admitting: Adult Health

## 2024-01-17 ENCOUNTER — Ambulatory Visit: Attending: Cardiology | Admitting: Cardiology

## 2024-01-17 VITALS — BP 104/66 | HR 44 | Resp 16 | Ht 64.0 in | Wt 176.6 lb

## 2024-01-17 DIAGNOSIS — R001 Bradycardia, unspecified: Secondary | ICD-10-CM

## 2024-01-17 DIAGNOSIS — I4819 Other persistent atrial fibrillation: Secondary | ICD-10-CM

## 2024-01-17 DIAGNOSIS — F17210 Nicotine dependence, cigarettes, uncomplicated: Secondary | ICD-10-CM | POA: Diagnosis not present

## 2024-01-17 DIAGNOSIS — E119 Type 2 diabetes mellitus without complications: Secondary | ICD-10-CM

## 2024-01-17 DIAGNOSIS — I480 Paroxysmal atrial fibrillation: Secondary | ICD-10-CM

## 2024-01-17 DIAGNOSIS — Z7901 Long term (current) use of anticoagulants: Secondary | ICD-10-CM | POA: Diagnosis not present

## 2024-01-17 DIAGNOSIS — I10 Essential (primary) hypertension: Secondary | ICD-10-CM

## 2024-01-17 DIAGNOSIS — Z9289 Personal history of other medical treatment: Secondary | ICD-10-CM

## 2024-01-17 DIAGNOSIS — E782 Mixed hyperlipidemia: Secondary | ICD-10-CM

## 2024-01-17 MED ORDER — DILTIAZEM HCL ER COATED BEADS 180 MG PO CP24: 180.0000 mg | ORAL_CAPSULE | Freq: Every day | ORAL | 3 refills | Status: AC

## 2024-01-17 MED ORDER — CHLORTHALIDONE 25 MG PO TABS: 12.5000 mg | ORAL_TABLET | Freq: Every morning | ORAL | 3 refills | Status: AC

## 2024-01-17 NOTE — Progress Notes (Unsigned)
 Cardiology Office Note:  .   ID:  [name], DOB Sep 01, 1951, MRN [id number] PCP:  Patient, No Pcp Per  Former Cardiology Providers: None [facility] Providers Cardiologist:  Olinda Bertrand, [place] , [facility] (established care November 2022) Electrophysiologist:  None  Click to update primary MD,subspecialty MD or APP then REFRESH:1}    Chief Complaint  Patient presents with   Follow-up    Persistent atrial fibrillation    History of Present Illness: .   [name] is a 73 y.o. African-American female whose past medical history and cardiovascular risk factors includes: Persistent atrial fibrillation status post TEE guided cardioversion (08/2021), aortic atherosclerosis, COPD, tobacco use, diet-controlled type II diabetes, hypertension, hyperlipidemia, rheumatoid arthritis, GERD .    Patient was hospitalized in November 2022 for COPD exacerbation was diagnosed with new onset of atrial fibrillation with rapid ventricular rate.  She did not convert with parenteral medications and underwent TEE guided cardioversion and since then has remained in sinus rhythm.  Patient presents today for 21-month follow-up visit.  At last office visit Lopressor  25 mg p.o. twice daily was discontinued and Cardizem  was increased to 240 mg p.o. daily.  Patient was also asked to discontinue potassium supplements and was started on spironolactone .  In the interim she did undergo endoscopy and November 2024 and did well.  Overall doing well from a cardiovascular standpoint.  Denies anginal chest pain or heart failure symptoms.  She continues to walk approximately half a mile twice a week and takes care of her dog.  Patient does not endorse evidence of bleeding.  Home blood pressure ranges between 118-130 mmHg on current regimen.  Unfortunately, continues to smoke at least 3 cigarettes/day, no change since last visit.    Review of Systems: .   Review of Systems  Cardiovascular:  Negative for chest pain,  claudication, irregular heartbeat, leg swelling, near-syncope, orthopnea, palpitations, paroxysmal nocturnal dyspnea and syncope.  Respiratory:  Negative for shortness of breath.   Hematologic/Lymphatic: Negative for bleeding problem.    Studies Reviewed:   EKG: EKG Interpretation Date/Time:  Wednesday January 17 2024 11:22:19 EDT Ventricular Rate:  44 PR Interval:  174 QRS Duration:  78 QT Interval:  446 QTC Calculation: 381 R Axis:   47  Text Interpretation: Marked sinus bradycardia with marked sinus arrhythmia When compared with ECG of 31-Jul-2023 15:46, No significant change was found Confirmed by Olinda Bertrand [phone]) on 01/17/2024 11:31:38 AM  Echocardiogram: 06/29/2022 [facility] health outside records provided by the patient): LVEF 55-60%, grade 2 diastolic dysfunction, elevated LAP, mildly dilated left atrium, mild TR.   Stress Testing: Exercise Myoview stress test 12/28/2021: Exercise nuclear stress test was performed using Bruce protocol. 1 Day Rest/Stress Protocol. Exercise time 4 minutes 10 seconds, 6.04 METS, 88%APMHR.  Stress ECG negative for ischemia.  Normal myocardial perfusion with diaphragmatic attenuation artifact.  No convincing evidence of reversible myocardial ischemia or prior infarct. Left ventricular size is normal, LVEF visually preserved.  Low risk study.  TEE guided cardioversion: 08/26/2021: 200 J x 1 restored to sinus rhythm   RADIOLOGY: N/A  Risk Assessment/Calculations:   Click Here to Calculate/Change CHADS2VASc Score The patient's CHADS2-VASc score is 5, indicating a 7.2% annual risk of stroke.    Labs:       Latest Ref Rng & Units 08/04/2023   11:59 AM 01/31/2023    1:08 PM 01/02/2023    4:32 PM  CBC  WBC 4.0 - 10.5 K/uL   9.2   Hemoglobin 11.1 -  15.9 g/dL 16.1  09.6  04.5   Hematocrit 34.0 - 46.6 % 35.6  36.9  35.2   Platelets 150 - 400 K/uL   249        Latest Ref Rng & Units 08/04/2023   11:59 AM 01/31/2023    1:08 PM 01/02/2023     4:32 PM  BMP  Glucose 70 - 99 mg/dL 84  76  409   BUN 8 - 27 mg/dL 15  16  12    Creatinine 0.57 - 1.00 mg/dL 8.11  9.14  7.82   BUN/Creat Ratio 12 - 28 13  18     Sodium 134 - 144 mmol/L 134  142  127   Potassium 3.5 - 5.2 mmol/L 4.0  4.4  3.1   Chloride 96 - 106 mmol/L 91  98  88   CO2 20 - 29 mmol/L 27  26  29    Calcium  8.7 - 10.3 mg/dL 95.6  21.3  9.7       Latest Ref Rng & Units 08/04/2023   11:59 AM 01/31/2023    1:08 PM 01/02/2023    4:32 PM  CMP  Glucose 70 - 99 mg/dL 84  76  086   BUN 8 - 27 mg/dL 15  16  12    Creatinine 0.57 - 1.00 mg/dL 5.78  4.69  6.29   Sodium 134 - 144 mmol/L 134  142  127   Potassium 3.5 - 5.2 mmol/L 4.0  4.4  3.1   Chloride 96 - 106 mmol/L 91  98  88   CO2 20 - 29 mmol/L 27  26  29    Calcium  8.7 - 10.3 mg/dL 52.8  41.3  9.7   Total Protein 6.5 - 8.1 g/dL   7.2   Total Bilirubin 0.3 - 1.2 mg/dL   0.7   Alkaline Phos 38 - 126 U/L   62   AST 15 - 41 U/L   18   ALT 0 - 44 U/L   12     No results found for: "CHOL", "HDL", "LDLCALC", "LDLDIRECT", "TRIG", "CHOLHDL" No results for input(s): "LIPOA" in the last 8760 hours. No components found for: "NTPROBNP" No results for input(s): "PROBNP" in the last 8760 hours. No results for input(s): "TSH" in the last 8760 hours.   Physical Exam:    Today's Vitals   01/17/24 1117  BP: 104/66  Pulse: (!) 44  Resp: 16  SpO2: 95%  Weight: 176 lb 9.6 oz (80.1 kg)  Height: 5\' 4"  (1.626 m)   Body mass index is 30.31 kg/m. Wt Readings from Last 3 Encounters:  01/17/24 176 lb 9.6 oz (80.1 kg)  12/04/23 181 lb 6.4 oz (82.3 kg)  10/30/23 165 lb (74.8 kg)    Physical Exam  Constitutional: No distress.  hemodynamically stable  Neck: No JVD present.  Cardiovascular: Normal rate, regular rhythm, S1 normal and S2 normal. Exam reveals no gallop, no S3 and no S4.  No murmur heard. Pulmonary/Chest: Effort normal and breath sounds normal. No stridor. She has no wheezes. She has no rales.  Abdominal: Soft. Bowel  sounds are normal. She exhibits no distension. There is no abdominal tenderness.  Musculoskeletal:        General: No edema.     Cervical back: Neck supple.  Neurological: She is alert and oriented to person, place, and time. She has intact cranial nerves (2-12).  Skin: Skin is warm.     Impression & Recommendation(s):  Impression:   ICD-10-CM  1. Persistent atrial fibrillation (HCC)  I48.19 EKG 12-Lead    diltiazem  (CARDIZEM  CD) 180 MG 24 hr capsule    Basic metabolic panel with GFR    Hemoglobin and hematocrit, blood    Hemoglobin and hematocrit, blood    Basic metabolic panel with GFR    2. History of cardioversion  Z92.89     3. Long term (current) use of anticoagulants  Z79.01     4. Bradycardia  R00.1     5. Benign hypertension  I10 diltiazem  (CARDIZEM  CD) 180 MG 24 hr capsule    chlorthalidone  (HYGROTON ) 25 MG tablet    Basic metabolic panel with GFR    Basic metabolic panel with GFR    6. Mixed hyperlipidemia  E78.2     7. Non-insulin  dependent type 2 diabetes mellitus (HCC)  E11.9     8. Cigarette smoker  F17.210        Recommendation(s):  Persistent atrial fibrillation (HCC) History of cardioversion Long term (current) use of anticoagulants Rate control: Diltiazem . Rhythm control: N/A. Thromboembolic prophylaxis: Eliquis  History of direct-current cardioversion. EKG illustrates sinus bradycardia Does not endorse evidence of bleeding. Risks, benefits, and alternatives to anticoagulation discussed. Will check H&H and BMP to evaluate hemoglobin and renal function respectively  Bradycardia Given the degree of bradycardia we will decrease Cardizem  from 240 mg p.o. daily to 180 mg p.o. daily  Benign hypertension Office blood pressures are also soft and saw her home blood pressure readings. Reduce chlorthalidone  from 25 mg p.o. daily to 12.5 mg p.o. daily Reemphasized importance of monitoring her blood pressures at home  Mixed hyperlipidemia Currently on  atorvastatin  40 mg p.o. daily.   She denies myalgia or other side effects. Cardiology is following peripherally.  Non-insulin  dependent type 2 diabetes mellitus (HCC) Reemphasized importance of glycemic control. Continue ARB, statin therapy  Cigarette smoker Tobacco cessation counseling: Currently smoking 3 cigarettes/day. She is informed of the dangers of tobacco abuse including stroke, cancer, and MI, as well as benefits of tobacco cessation. She is willing to quit at this time. 5 mins were spent counseling patient cessation techniques. We discussed various methods to help quit smoking, including deciding on a date to quit, joining a support group, pharmacological agents- nicotine  gum/patch/lozenges.  I will reassess her progress at the next follow-up visit  Orders Placed:  Orders Placed This Encounter  Procedures   Basic metabolic panel with GFR    Standing Status:   Future    Number of Occurrences:   1    Expected Date:   01/24/2024    Expiration Date:   01/16/2025   Hemoglobin and hematocrit, blood    Standing Status:   Future    Number of Occurrences:   1    Expected Date:   01/24/2024    Expiration Date:   01/16/2025   EKG 12-Lead    Final Medication List:    Meds ordered this encounter  Medications   diltiazem  (CARDIZEM  CD) 180 MG 24 hr capsule    Sig: Take 1 capsule (180 mg total) by mouth daily.    Dispense:  90 capsule    Refill:  3    Decreased to 180 mg   chlorthalidone  (HYGROTON ) 25 MG tablet    Sig: Take 0.5 tablets (12.5 mg total) by mouth every morning.    Dispense:  90 tablet    Refill:  3    Decreased to 12.5 mg    Medications Discontinued During This Encounter  Medication Reason  doxycycline  (VIBRA -TABS) 100 MG tablet Completed Course   ibuprofen  (ADVIL ) 200 MG tablet Patient Preference   nicotine  (NICODERM CQ  - DOSED IN MG/24 HR) 7 mg/24hr patch Patient Preference   oseltamivir  (TAMIFLU ) 75 MG capsule Patient Preference   predniSONE  (DELTASONE )  10 MG tablet Patient Preference   diltiazem  (CARDIZEM  CD) 240 MG 24 hr capsule    chlorthalidone  (HYGROTON ) 25 MG tablet      Current Outpatient Medications:    albuterol  (PROVENTIL ) (2.5 MG/3ML) 0.083% nebulizer solution, Take 3 mLs (2.5 mg total) by nebulization every 6 (six) hours as needed for wheezing or shortness of breath., Disp: 75 mL, Rfl: 12   albuterol  (VENTOLIN  HFA) 108 (90 Base) MCG/ACT inhaler, INHALE 1 TO 2 INHALATIONS BY  MOUTH INTO THE LUNGS EVERY 6  HOURS AS NEEDED FOR SHORTNESS OF BREATH OR WHEEZING, Disp: 54 g, Rfl: 8   apixaban  (ELIQUIS ) 5 MG TABS tablet, Take 1 tablet (5 mg total) by mouth 2 (two) times daily., Disp: 60 tablet, Rfl: 2   atorvastatin  (LIPITOR) 40 MG tablet, Take 40 mg by mouth daily., Disp: , Rfl:    benzonatate  (TESSALON ) 200 MG capsule, Take 1 capsule (200 mg total) by mouth 3 (three) times daily as needed., Disp: 45 capsule, Rfl: 1   blood glucose meter kit and supplies KIT, Dispense based on patient and insurance preference. Use up to four times daily as directed., Disp: 1 each, Rfl: 0   EPINEPHrine  0.3 mg/0.3 mL IJ SOAJ injection, Inject 0.3 mg into the muscle as needed for anaphylaxis., Disp: 1 each, Rfl: 1   Fluticasone -Umeclidin-Vilant (TRELEGY ELLIPTA ) 100-62.5-25 MCG/ACT AEPB, Inhale 1 puff into the lungs daily., Disp: 3 each, Rfl: 3   losartan  (COZAAR ) 100 MG tablet, Take 1 tablet (100 mg total) by mouth every evening., Disp: 90 tablet, Rfl: 3   lubiprostone (AMITIZA) 24 MCG capsule, Take 24 mcg by mouth 2 (two) times daily with a meal., Disp: , Rfl:    MAGNESIUM-OXIDE 400 (240 Mg) MG tablet, Take 1 tablet by mouth daily., Disp: , Rfl:    metFORMIN  (GLUCOPHAGE ) 500 MG tablet, Take 1 tablet (500 mg total) by mouth 2 (two) times daily with a meal. (Patient taking differently: Take 500 mg by mouth daily with breakfast.), Disp: 30 tablet, Rfl: 11   metoprolol  tartrate (LOPRESSOR ) 25 MG tablet, Take 25 mg by mouth., Disp: , Rfl:    nicotine  polacrilex  (NICOTINE  MINI) 2 MG lozenge, Take 1 lozenge (2 mg total) by mouth as needed for smoking cessation., Disp: 100 tablet, Rfl: 0   pantoprazole (PROTONIX) 40 MG tablet, Take 40 mg by mouth 2 (two) times daily., Disp: , Rfl:    spironolactone  (ALDACTONE ) 25 MG tablet, Take 1 tablet (25 mg total) by mouth daily., Disp: 90 tablet, Rfl: 3   chlorthalidone  (HYGROTON ) 25 MG tablet, Take 0.5 tablets (12.5 mg total) by mouth every morning., Disp: 90 tablet, Rfl: 3   diltiazem  (CARDIZEM  CD) 180 MG 24 hr capsule, Take 1 capsule (180 mg total) by mouth daily., Disp: 90 capsule, Rfl: 3  Current Facility-Administered Medications:    ipratropium-albuterol  (DUONEB) 0.5-2.5 (3) MG/3ML nebulizer solution 3 mL, 3 mL, Nebulization, Q6H PRN, McQuaid, Douglas B, MD  Consent:   N/A  Disposition:   6 months sooner if needed Patient may be asked to follow-up sooner based on the results of the above-mentioned testing.  Her questions and concerns were addressed to her satisfaction. She voices understanding of the recommendations provided during this encounter.  Signed, Olinda Bertrand, DO, [facility]  [facility]  [address] [place], [place] [address] 01/19/2024 11:44 AM

## 2024-01-17 NOTE — Patient Instructions (Addendum)
 Medication Instructions:  Your physician has recommended you make the following change in your medication:   DECREASE Diltiazem  (Cardizem ) to 180 mg  HOLD if systolic blood pressure (top number) is less than 100 and/ or heart rate is less than 55.   DECREASE Chlorthalidone  to 12.5 mg   *If you need a refill on your cardiac medications before your next appointment, please call your pharmacy*  Lab Work: To be completed in 1 week: BMP and hemoglobin/hematocrit   If you have labs (blood work) drawn today and your tests are completely normal, you will receive your results only by: MyChart Message (if you have MyChart) OR A paper copy in the mail If you have any lab test that is abnormal or we need to change your treatment, we will call you to review the results.  Testing/Procedures: None ordered today.  Follow-Up: At [facility], you and your health needs are our priority.  As part of our continuing mission to provide you with exceptional heart care, we have created designated Provider Care Teams.  These Care Teams include your primary Cardiologist (physician) and Advanced Practice Providers (APPs -  Physician Assistants and Nurse Practitioners) who all work together to provide you with the care you need, when you need it.  Your next appointment:   6 month(s)  The format for your next appointment:   In Person  Provider:   Olinda Bertrand, [facility]  Other Instructions   1st Floor: - Lobby - Registration  - Pharmacy  - Lab - Cafe  2nd Floor: - PV Lab - Diagnostic Testing (echo, CT, nuclear med)  3rd Floor: - Vacant  4th Floor: - TCTS (cardiothoracic surgery) - AFib Clinic - Structural Heart Clinic - Vascular Surgery  - Vascular Ultrasound  5th Floor: - [facility] (general and EP) - Clinical Pharmacy for coumadin, hypertension, lipid, weight-loss medications, and med management appointments    Valet parking services will be available as well.

## 2024-01-19 ENCOUNTER — Encounter: Payer: Self-pay | Admitting: Cardiology

## 2024-01-22 ENCOUNTER — Encounter: Payer: Self-pay | Admitting: Adult Health

## 2024-01-22 ENCOUNTER — Ambulatory Visit (INDEPENDENT_AMBULATORY_CARE_PROVIDER_SITE_OTHER): Admitting: Adult Health

## 2024-01-22 VITALS — BP 112/62 | HR 59 | Temp 98.2°F | Ht 64.0 in | Wt 172.0 lb

## 2024-01-22 DIAGNOSIS — Z720 Tobacco use: Secondary | ICD-10-CM

## 2024-01-22 DIAGNOSIS — J441 Chronic obstructive pulmonary disease with (acute) exacerbation: Secondary | ICD-10-CM

## 2024-01-22 MED ORDER — ALBUTEROL SULFATE (2.5 MG/3ML) 0.083% IN NEBU: 2.5000 mg | INHALATION_SOLUTION | Freq: Four times a day (QID) | RESPIRATORY_TRACT | 5 refills | Status: AC | PRN

## 2024-01-22 MED ORDER — ALBUTEROL SULFATE (2.5 MG/3ML) 0.083% IN NEBU: 2.5000 mg | INHALATION_SOLUTION | Freq: Four times a day (QID) | RESPIRATORY_TRACT | 5 refills | Status: DC | PRN

## 2024-01-22 NOTE — Patient Instructions (Signed)
 Work on not smoking .  Continue on Trelegy 1 puff daily, rinse after use.  Albuterol  inhaler 1-2 puffs every 4hr as needed for wheezing .  Follow up with Dr. Marygrace Snellen or Yuli Lanigan NP in 4 months  -30 min slot  Please contact office for sooner follow up if symptoms do not improve or worsen or seek emergency care

## 2024-01-22 NOTE — Progress Notes (Unsigned)
 @Patient  ID: [name], female    DOB: [birth year], 73 y.o.   MRN: [id number]  Chief Complaint  Patient presents with   Follow-up    Referring provider: No ref. provider found  HPI: 73 year old female active smoker followed for COPD with emphysema Participate in the lung cancer CT chest screening program.  TEST/EVENTS :  [employer] 09/06/2012  FeV1 77%  FeV1/FVC 77%  Fef 25 75 49%  CXR 07/2016, 11/2017  neg   Prevnar 2016   LDCT 11/2022 a lung RADS 1S with no suspicious pulmonary nodules.  Mild diffuse bronchial wall thickening and mild emphysema.  01/22/2024 Follow up : COPD with Emphysema  Patient presents for a 6-week follow-up.  Patient was seen last visit for a COPD exacerbation.  She initially had influenza and was treated with a 5-day course of Tamiflu .  Chest x-ray showed bronchitic changes.  Last visit she was treated with a 7-day course of doxycycline  and a prednisone  taper.  Patient says she is feeling much better with decreased cough and congestion.  Feels that she is nearing her baseline.  She does continue to smoke.  We discussed smoking cessation.  She remains on Trelegy inhaler daily. She participates in the lung cancer CT chest screening program.  CT chest December 11, 2023 showed lung RADS 1 with no suspicious nodules.  Stable emphysema.  Smoking-related respiratory bronchiolitis. Patient did have a another low-dose CT chest done on January 12, 2024 with results pending.  I have contacted screening program and it appears that this was a scheduling error at radiology as this was supposed to have been done in 2026 for her yearly serial CT.   Allergies  Allergen Reactions   Azithromycin Anaphylaxis and Swelling    Eyes swell   Benazepril Anaphylaxis    Other reaction(s): SWELLING   Cephalexin Anaphylaxis and Swelling   Spiriva [Tiotropium Bromide Monohydrate] Swelling    Patient reports tongue swelling.   Benadryl  [Diphenhydramine  Hcl] Nausea And Vomiting   Other Other  (See Comments)    Reports allergy to insects. Unsure of reaction.   Nexium [Esomeprazole Magnesium] Palpitations    Immunization History  Administered Date(s) Administered   Fluad Quad(high Dose 65+) 06/28/2022   Influenza Split 06/26/2012, 08/19/2013, 07/05/2014, 07/26/2016   Influenza, High Dose Seasonal PF 07/26/2018, 07/29/2019   Influenza,inj,Quad PF,6+ Mos 07/16/2015, 07/26/2016, 07/10/2017   Influenza-Unspecified 06/26/2012, 08/19/2013, 07/05/2014, 07/26/2016   PFIZER(Purple Top)SARS-COV-2 Vaccination 01/04/2020, 01/28/2020, 09/24/2020   Pneumococcal Conjugate-13 01/22/2015    Past Medical History:  Diagnosis Date   Arthritis    Asthma    COPD (chronic obstructive pulmonary disease) (HCC)    Diabetes mellitus    Heart murmur    High cholesterol    Hyperlipidemia    Hypertension    Kidney infection    Kidney stone    Reflux     Tobacco History: Social History   Tobacco Use  Smoking Status Some Days   Current packs/day: 0.50   Average packs/day: 0.5 packs/day for 57.3 years (28.7 ttl pk-yrs)   Types: Cigarettes   Start date: 09/26/1966  Smokeless Tobacco Current   Types: Chew  Tobacco Comments   Started smoking at age 21.  Currently smoking 2-3 cig per day   Ready to quit: Not Answered Counseling given: Not Answered Tobacco comments: Started smoking at age 42.  Currently smoking 2-3 cig per day   Outpatient Medications Prior to Visit  Medication Sig Dispense Refill   albuterol  (PROVENTIL ) (2.5 MG/3ML) 0.083% nebulizer solution  Take 3 mLs (2.5 mg total) by nebulization every 6 (six) hours as needed for wheezing or shortness of breath. 75 mL 12   albuterol  (VENTOLIN  HFA) 108 (90 Base) MCG/ACT inhaler INHALE 1 TO 2 INHALATIONS BY  MOUTH INTO THE LUNGS EVERY 6  HOURS AS NEEDED FOR SHORTNESS OF BREATH OR WHEEZING 54 g 8   apixaban  (ELIQUIS ) 5 MG TABS tablet Take 1 tablet (5 mg total) by mouth 2 (two) times daily. 60 tablet 2   atorvastatin  (LIPITOR) 40 MG tablet  Take 40 mg by mouth daily.     benzonatate  (TESSALON ) 200 MG capsule Take 1 capsule (200 mg total) by mouth 3 (three) times daily as needed. 45 capsule 1   blood glucose meter kit and supplies KIT Dispense based on patient and insurance preference. Use up to four times daily as directed. 1 each 0   chlorthalidone  (HYGROTON ) 25 MG tablet Take 0.5 tablets (12.5 mg total) by mouth every morning. 90 tablet 3   diltiazem  (CARDIZEM  CD) 180 MG 24 hr capsule Take 1 capsule (180 mg total) by mouth daily. 90 capsule 3   EPINEPHrine  0.3 mg/0.3 mL IJ SOAJ injection Inject 0.3 mg into the muscle as needed for anaphylaxis. 1 each 1   Fluticasone -Umeclidin-Vilant (TRELEGY ELLIPTA ) 100-62.5-25 MCG/ACT AEPB Inhale 1 puff into the lungs daily. 3 each 3   losartan  (COZAAR ) 100 MG tablet Take 1 tablet (100 mg total) by mouth every evening. 90 tablet 3   lubiprostone (AMITIZA) 24 MCG capsule Take 24 mcg by mouth 2 (two) times daily with a meal.     MAGNESIUM-OXIDE 400 (240 Mg) MG tablet Take 1 tablet by mouth daily.     metFORMIN  (GLUCOPHAGE ) 500 MG tablet Take 1 tablet (500 mg total) by mouth 2 (two) times daily with a meal. (Patient taking differently: Take 500 mg by mouth daily with breakfast.) 30 tablet 11   metoprolol  tartrate (LOPRESSOR ) 25 MG tablet Take 25 mg by mouth.     nicotine  polacrilex (NICOTINE  MINI) 2 MG lozenge Take 1 lozenge (2 mg total) by mouth as needed for smoking cessation. 100 tablet 0   pantoprazole (PROTONIX) 40 MG tablet Take 40 mg by mouth 2 (two) times daily.     spironolactone  (ALDACTONE ) 25 MG tablet Take 1 tablet (25 mg total) by mouth daily. 90 tablet 3   Facility-Administered Medications Prior to Visit  Medication Dose Route Frequency Provider Last Rate Last Admin   ipratropium-albuterol  (DUONEB) 0.5-2.5 (3) MG/3ML nebulizer solution 3 mL  3 mL Nebulization Q6H PRN Marine Sia, MD         Review of Systems:   Constitutional:   No  weight loss, night sweats,  Fevers,  chills, fatigue, or  lassitude.  HEENT:   No headaches,  Difficulty swallowing,  Tooth/dental problems, or  Sore throat,                No sneezing, itching, ear ache, nasal congestion, post nasal drip,   CV:  No chest pain,  Orthopnea, PND, swelling in lower extremities, anasarca, dizziness, palpitations, syncope.   GI  No heartburn, indigestion, abdominal pain, nausea, vomiting, diarrhea, change in bowel habits, loss of appetite, bloody stools.   Resp: No shortness of breath with exertion or at rest.  No excess mucus, no productive cough,  No non-productive cough,  No coughing up of blood.  No change in color of mucus.  No wheezing.  No chest wall deformity  Skin: no rash or lesions.  GU: no dysuria, change in color of urine, no urgency or frequency.  No flank pain, no hematuria   MS:  No joint pain or swelling.  No decreased range of motion.  No back pain.    Physical Exam   GEN: A/Ox3; pleasant , NAD, well nourished    HEENT:  [place]/AT,  NOSE-clear, THROAT-clear, no lesions, no postnasal drip or exudate noted.   NECK:  Supple w/ fair ROM; no JVD; normal carotid impulses w/o bruits; no thyromegaly or nodules palpated; no lymphadenopathy.    RESP  Clear  P & A; w/o, wheezes/ rales/ or rhonchi. no accessory muscle use, no dullness to percussion  CARD:  RRR, no m/r/g, no peripheral edema, pulses intact, no cyanosis or clubbing.  GI:   Soft & nt; nml bowel sounds; no organomegaly or masses detected.   Musco: Warm bil, no deformities or joint swelling noted.   Neuro: alert, no focal deficits noted.    Skin: Warm, no lesions or rashes    Lab Results:  CBC  ProBNP No results found for: "PROBNP"  Imaging: No results found.  Administration History     None           No data to display          No results found for: "NITRICOXIDE"      Assessment & Plan:   No problem-specific Assessment & Plan notes found for this encounter.     Roena Clark,  NP 01/22/2024

## 2024-01-23 LAB — BASIC METABOLIC PANEL WITH GFR
BUN/Creatinine Ratio: 12 (ref 12–28)
BUN: 15 mg/dL (ref 8–27)
CO2: 24 mmol/L (ref 20–29)
Calcium: 10.3 mg/dL (ref 8.7–10.3)
Chloride: 96 mmol/L (ref 96–106)
Creatinine, Ser: 1.23 mg/dL — ABNORMAL HIGH (ref 0.57–1.00)
Glucose: 114 mg/dL — ABNORMAL HIGH (ref 70–99)
Potassium: 4.4 mmol/L (ref 3.5–5.2)
Sodium: 134 mmol/L (ref 134–144)
eGFR: 47 mL/min/{1.73_m2} — ABNORMAL LOW (ref >59)

## 2024-01-23 LAB — HEMOGLOBIN AND HEMATOCRIT, BLOOD
Hematocrit: 32.9 % — ABNORMAL LOW (ref 34.0–46.6)
Hemoglobin: 10.7 g/dL — ABNORMAL LOW (ref 11.1–15.9)

## 2024-01-23 NOTE — Assessment & Plan Note (Signed)
 Smoking cessation discussed with patient.  Low-dose CT chest December 11, 2023 showed no suspicious nodules. Scheduling error with CT chest done January 12, 2024-results are pending.  Lung cancer screening program has been contacted and aware radiology scheduling error.

## 2024-01-23 NOTE — Assessment & Plan Note (Signed)
 Recent COPD exacerbation in the setting of influenza-patient is clinically improved after course of antiviral therapy and antibiotics and steroids.  She is to continue on triple therapy maintenance regimen with Trelegy inhaler daily.  Continue on chronic rhinitis treatment.  Smoking cessation discussed in detail  Plan  Patient Instructions  Work on not smoking .  Continue on Trelegy 1 puff daily, rinse after use.  Albuterol  inhaler 1-2 puffs every 4hr as needed for wheezing .  Follow up with Dr. Marygrace Snellen or Quintana Canelo NP in 4 months  -30 min slot  Please contact office for sooner follow up if symptoms do not improve or worsen or seek emergency care

## 2024-01-24 ENCOUNTER — Telehealth: Payer: Self-pay | Admitting: Acute Care

## 2024-01-24 NOTE — Telephone Encounter (Signed)
 Patient is a [facility] patient. Patient had a LDCT on 12/11/2023 that resulted as a LR1. Patient was notified through MyChart of her results and the need for a repeat scan in 12 months. A new order was placed for patient for an annual scan, due in 11/2024. The due date was listed in the comments of her annual scan. Patient was erroneously scheduled for a repeat scan on 01/12/2024, these results are still pending. Patient is aware this scan was unnecessary. I received a call today from Burdette Carolin Risk analyst) at [employer] that the pts account will be credited for the erroneous LDCT performed on 01/12/2024. I called patient to inform her of this information and she should not receive a bill. Patient aware to contact [facility] dept if she receives a bill. Patient also aware we will reach out to her the next time her scan is due, in 1 year. Dara Ear NP is aware of situation. Nothing further needed at this time.

## 2024-01-25 ENCOUNTER — Encounter: Payer: Self-pay | Admitting: Cardiology

## 2024-01-26 ENCOUNTER — Other Ambulatory Visit: Payer: Self-pay

## 2024-01-26 DIAGNOSIS — I4819 Other persistent atrial fibrillation: Secondary | ICD-10-CM

## 2024-02-08 ENCOUNTER — Telehealth: Payer: Self-pay | Admitting: Cardiology

## 2024-02-08 NOTE — Telephone Encounter (Signed)
 Spoke with patient and she states she forgot that she was supposed to stop metoprolol . She saw PCP today and when they were reviewing meds she told her she was not supposed to be taking medication.  She states PCP informed her to let provider and stop taking medication today.

## 2024-02-08 NOTE — Telephone Encounter (Signed)
 Left voicemail to return call to office

## 2024-02-08 NOTE — Telephone Encounter (Signed)
 Pt c/o medication issue:  1. Name of Medication: metoprolol  tartrate (LOPRESSOR ) 25 MG tablet   2. How are you currently taking this medication (dosage and times per day)?   3. Are you having a reaction (difficulty breathing--STAT)? no  4. What is your medication issue? Calling to see if she should continue to take medication. Please advise

## 2024-02-08 NOTE — Telephone Encounter (Signed)
 Pt returning call to a nurse

## 2024-02-08 NOTE — Telephone Encounter (Signed)
 Patient is returning phone call.

## 2024-02-09 ENCOUNTER — Other Ambulatory Visit: Payer: Self-pay

## 2024-02-09 DIAGNOSIS — Z122 Encounter for screening for malignant neoplasm of respiratory organs: Secondary | ICD-10-CM

## 2024-02-09 DIAGNOSIS — F17210 Nicotine dependence, cigarettes, uncomplicated: Secondary | ICD-10-CM

## 2024-02-09 DIAGNOSIS — Z87891 Personal history of nicotine dependence: Secondary | ICD-10-CM

## 2024-02-10 ENCOUNTER — Encounter: Payer: Self-pay | Admitting: Cardiology

## 2024-02-12 NOTE — Telephone Encounter (Signed)
 Spoke with pt and reconciled medications. Pt confirmed she has stopped Metoprolol  and is still taking the Diltiazem  180 mg once daily. Pt advised to continue to monitor her HR readings and call us  with any concerns.

## 2024-02-12 NOTE — Telephone Encounter (Signed)
 Yes, she was told many months back to stop metoprolol .  That explains why her HR was still low at the last visit and we reduced her cardizem  as well.  This is why I tell patients to bring medications bottles if they cannot reconcile them accurately.  In summary, stop metoprolol  as original discussed and continue cardizem  180mg  po qday.  Monitor her pulse to see if additional medications are needed.   Walta Bellville Bonnie, DO, FACC

## 2024-02-15 NOTE — Telephone Encounter (Signed)
 Yes, but relative at baseline when compared to November 2024.   I would recommend that she follows up w/ PCP first to discuss her anemia to see if additional work up is needed.   Mckynleigh Mussell Malakoff, DO, FACC

## 2024-07-24 ENCOUNTER — Other Ambulatory Visit: Payer: Self-pay | Admitting: Cardiology

## 2024-07-24 DIAGNOSIS — I10 Essential (primary) hypertension: Secondary | ICD-10-CM

## 2024-10-03 ENCOUNTER — Ambulatory Visit: Admitting: Cardiology
